# Patient Record
Sex: Female | Born: 1995 | Race: Black or African American | Hispanic: No | Marital: Single | Smoking: Current every day smoker
Health system: Southern US, Community
[De-identification: ages and names within clinical notes are randomized; demographics above are authoritative.]

## PROBLEM LIST (undated history)

## (undated) ENCOUNTER — Inpatient Hospital Stay (HOSPITAL_COMMUNITY): Payer: Self-pay

## (undated) DIAGNOSIS — R51 Headache: Secondary | ICD-10-CM

## (undated) HISTORY — PX: NO PAST SURGERIES: SHX2092

---
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
## 2013-01-17 ENCOUNTER — Encounter (HOSPITAL_COMMUNITY): Payer: Self-pay | Admitting: *Deleted

## 2013-01-17 ENCOUNTER — Emergency Department (HOSPITAL_COMMUNITY)
Admission: EM | Admit: 2013-01-17 | Discharge: 2013-01-17 | Discharge status: Against Medical Advice | Payer: Self-pay | Attending: Emergency Medicine | Admitting: Emergency Medicine

## 2013-01-17 DIAGNOSIS — W57XXXA Bitten or stung by nonvenomous insect and other nonvenomous arthropods, initial encounter: Secondary | ICD-10-CM | POA: Insufficient documentation

## 2013-01-17 DIAGNOSIS — F17200 Nicotine dependence, unspecified, uncomplicated: Secondary | ICD-10-CM | POA: Insufficient documentation

## 2013-01-17 DIAGNOSIS — L299 Pruritus, unspecified: Secondary | ICD-10-CM | POA: Insufficient documentation

## 2013-01-17 DIAGNOSIS — Y939 Activity, unspecified: Secondary | ICD-10-CM | POA: Insufficient documentation

## 2013-01-17 DIAGNOSIS — Y929 Unspecified place or not applicable: Secondary | ICD-10-CM | POA: Insufficient documentation

## 2013-01-17 DIAGNOSIS — T148 Other injury of unspecified body region: Secondary | ICD-10-CM | POA: Insufficient documentation

## 2013-01-17 NOTE — ED Notes (Signed)
Pt not in [facility] when called

## 2013-01-17 NOTE — ED Notes (Signed)
Nurse first stated that she saw pt and pt's family leave.

## 2013-01-17 NOTE — ED Notes (Signed)
Pt reports having mosquito bites that she is concerned she may be allergic to - pt states the area itches and becomes swollen.

## 2013-02-28 ENCOUNTER — Encounter (HOSPITAL_COMMUNITY): Payer: Self-pay | Admitting: *Deleted

## 2013-02-28 ENCOUNTER — Emergency Department (HOSPITAL_COMMUNITY)
Admission: EM | Admit: 2013-02-28 | Discharge: 2013-02-28 | Disposition: A | Discharge status: Home / Self Care | Payer: Self-pay | Attending: Emergency Medicine | Admitting: Emergency Medicine

## 2013-02-28 DIAGNOSIS — N949 Unspecified condition associated with female genital organs and menstrual cycle: Secondary | ICD-10-CM | POA: Insufficient documentation

## 2013-02-28 DIAGNOSIS — N9089 Other specified noninflammatory disorders of vulva and perineum: Secondary | ICD-10-CM

## 2013-02-28 DIAGNOSIS — Q519 Congenital malformation of uterus and cervix, unspecified: Secondary | ICD-10-CM | POA: Insufficient documentation

## 2013-02-28 DIAGNOSIS — Z3202 Encounter for pregnancy test, result negative: Secondary | ICD-10-CM | POA: Insufficient documentation

## 2013-02-28 DIAGNOSIS — Z789 Other specified health status: Secondary | ICD-10-CM | POA: Insufficient documentation

## 2013-02-28 DIAGNOSIS — F17200 Nicotine dependence, unspecified, uncomplicated: Secondary | ICD-10-CM | POA: Insufficient documentation

## 2013-02-28 DIAGNOSIS — Z79899 Other long term (current) drug therapy: Secondary | ICD-10-CM | POA: Insufficient documentation

## 2013-02-28 LAB — URINALYSIS, ROUTINE W REFLEX MICROSCOPIC
Bilirubin Urine: NEGATIVE
Ketones, ur: NEGATIVE mg/dL
Nitrite: NEGATIVE
Protein, ur: NEGATIVE mg/dL
Urobilinogen, UA: 1 mg/dL (ref 0.0–1.0)
pH: 5.5 (ref 5.0–8.0)

## 2013-02-28 LAB — WET PREP, GENITAL: Clue Cells Wet Prep HPF POC: NONE SEEN

## 2013-02-28 LAB — URINE MICROSCOPIC-ADD ON

## 2013-02-28 MED ORDER — LIDOCAINE 5 % EX OINT: TOPICAL_OINTMENT | CUTANEOUS | Status: DC | PRN

## 2013-02-28 NOTE — ED Notes (Signed)
Pt of discomfort to labia for several days.  Denies vaginal discharge.

## 2013-02-28 NOTE — ED Provider Notes (Signed)
CSN: [id number]     Arrival date & time 02/28/13  1237 History     First MD Initiated Contact with Patient 02/28/13 1311     Chief Complaint  Patient presents with  . Vaginal Itching   (Consider location/radiation/quality/duration/timing/severity/associated sxs/prior Treatment) HPI Patient is an 17 year old female who presented to the emergency department complaint cough back pain. Patient states pain began 3-4 days ago. He states pain is on the outside around the vaginal opening. Patient denies any vaginal discharge or vaginal bleeding. Patient is sexually active and admits to using new kind of condom. Patient denies any fever chills or malaise. Patient denies any history of similar symptoms. Patient denies being pregnant, as any urinary symptoms. Past Medical History  Diagnosis Date  . Medical history non-contributory   . Cervical incompetence with baby delivered in second trimester 12/04/2012    Needs cerclage and close follow up with subsequent pregnancies    Past Surgical History  Procedure Laterality Date  . No past surgeries    . Dilation and evacuation N/A 12/04/2012    Procedure: DILATATION AND EVACUATION;  Surgeon: Tereso Newcomer, MD;  Location: [facility] OR[facility];  Service: Gynecology;  Laterality: N/A;   No family history on file. History  Substance Use Topics  . Smoking status: Current Every Day Smoker -- 0.50 packs/day for 1 years    Types: Cigarettes  . Smokeless tobacco: Not on file  . Alcohol Use: No   OB History   Grav Para Term Preterm Abortions TAB SAB Ect Mult Living   2 1 1       1      Review of Systems  Constitutional: Negative for fever and chills.  Gastrointestinal: Negative for abdominal pain.  Genitourinary: Positive for vaginal pain. Negative for dysuria, urgency, vaginal bleeding, vaginal discharge and pelvic pain.  Skin: Positive for wound.  All other systems reviewed and are negative.    Allergies  Review of patient's allergies indicates no known  allergies.  Home Medications   Current Outpatient Rx  Name  Route  Sig  Dispense  Refill  . ferrous sulfate (FERROUSUL) 325 (65 FE) MG tablet   Oral   Take 1 tablet (325 mg total) by mouth 3 (three) times daily with meals.   90 tablet   2    BP 102/64  Pulse 106  Temp(Src) 98 F (36.7 C) (Oral)  Resp 16  SpO2 99%  LMP 07/30/2012  Breastfeeding? No Physical Exam  Nursing note and vitals reviewed. Constitutional: She appears well-developed and well-nourished. No distress.  HENT:  Head: Normocephalic.  Eyes: Conjunctivae are normal.  Neck: Neck supple.  Cardiovascular: Normal rate, regular rhythm and normal heart sounds.   Pulmonary/Chest: Effort normal and breath sounds normal. No respiratory distress. She has no wheezes. She has no rales.  Abdominal: Soft. Bowel sounds are normal. She exhibits no distension. There is no tenderness. There is no rebound.  Genitourinary:  Normal external genitalia with exception of several superficial skin tears in the posterior vaginal opening. No bleeding. Vaginal canal is normal with a thin white vaginal discharge. Cervix is normal. No cervical motion tenderness. No uterine or adnexal tenderness.  Musculoskeletal: She exhibits no edema.  Neurological: She is alert.  Skin: Skin is warm and dry.  Psychiatric: She has a normal mood and affect. Her behavior is normal.    ED Course   Procedures (including critical care time)  Labs Reviewed  WET PREP, GENITAL - Abnormal; Notable for the following:  WBC, Wet Prep HPF POC MODERATE (*)    All other components within normal limits  URINALYSIS, ROUTINE W REFLEX MICROSCOPIC - Abnormal; Notable for the following:    Leukocytes, UA SMALL (*)    All other components within normal limits  GC/CHLAMYDIA PROBE AMP  PREGNANCY, URINE  URINE MICROSCOPIC-ADD ON   1. Fissure of genital labia     MDM  Patient with several tear and medial fissures red around the posterior vaginal opening. No signs of  infection or an abscess. Pelvic exam unremarkable. Culture sent. Patient denies any vaginal or urinary symptoms. Will treat symptomatically with at home sitz baths, lidocaine cream, pelvic rest and followup as needed.  Filed Vitals:   02/28/13 1242  BP: 102/64  Pulse: 106  Temp: 98 F (36.7 C)  TempSrc: Oral  Resp: 16  SpO2: 99%     Shritha Bresee A Aodhan Scheidt, PA-C 02/28/13 1504

## 2013-03-01 NOTE — ED Provider Notes (Signed)
Medical screening examination/treatment/procedure(s) were performed by non-physician practitioner and as supervising physician I was immediately available for consultation/collaboration.   Loren Racer, MD 03/01/13 1215

## 2013-03-02 LAB — GC/CHLAMYDIA PROBE AMP: GC Probe RNA: NEGATIVE

## 2013-03-19 ENCOUNTER — Emergency Department (HOSPITAL_COMMUNITY)
Admission: EM | Admit: 2013-03-19 | Discharge: 2013-03-19 | Disposition: A | Discharge status: Home / Self Care | Payer: Medicaid Other | Attending: Emergency Medicine | Admitting: Emergency Medicine

## 2013-03-19 ENCOUNTER — Encounter (HOSPITAL_COMMUNITY): Payer: Self-pay | Admitting: *Deleted

## 2013-03-19 DIAGNOSIS — N949 Unspecified condition associated with female genital organs and menstrual cycle: Secondary | ICD-10-CM | POA: Insufficient documentation

## 2013-03-19 DIAGNOSIS — N760 Acute vaginitis: Secondary | ICD-10-CM | POA: Insufficient documentation

## 2013-03-19 DIAGNOSIS — Z3202 Encounter for pregnancy test, result negative: Secondary | ICD-10-CM | POA: Insufficient documentation

## 2013-03-19 DIAGNOSIS — J3489 Other specified disorders of nose and nasal sinuses: Secondary | ICD-10-CM | POA: Insufficient documentation

## 2013-03-19 DIAGNOSIS — R51 Headache: Secondary | ICD-10-CM | POA: Insufficient documentation

## 2013-03-19 DIAGNOSIS — A499 Bacterial infection, unspecified: Secondary | ICD-10-CM | POA: Insufficient documentation

## 2013-03-19 DIAGNOSIS — B9689 Other specified bacterial agents as the cause of diseases classified elsewhere: Secondary | ICD-10-CM | POA: Insufficient documentation

## 2013-03-19 DIAGNOSIS — N72 Inflammatory disease of cervix uteri: Secondary | ICD-10-CM | POA: Insufficient documentation

## 2013-03-19 DIAGNOSIS — R110 Nausea: Secondary | ICD-10-CM | POA: Insufficient documentation

## 2013-03-19 DIAGNOSIS — F17200 Nicotine dependence, unspecified, uncomplicated: Secondary | ICD-10-CM | POA: Insufficient documentation

## 2013-03-19 DIAGNOSIS — N898 Other specified noninflammatory disorders of vagina: Secondary | ICD-10-CM

## 2013-03-19 LAB — URINALYSIS, ROUTINE W REFLEX MICROSCOPIC
Bilirubin Urine: NEGATIVE
Ketones, ur: NEGATIVE mg/dL
Nitrite: NEGATIVE
Protein, ur: NEGATIVE mg/dL
pH: 7.5 (ref 5.0–8.0)

## 2013-03-19 LAB — WET PREP, GENITAL

## 2013-03-19 MED ORDER — CEFTRIAXONE SODIUM 250 MG IJ SOLR
250.0000 mg | Freq: Once | INTRAMUSCULAR | Status: AC
  Administered 2013-03-19: 250 mg via INTRAMUSCULAR
  Filled 2013-03-19: qty 250

## 2013-03-19 MED ORDER — AZITHROMYCIN 250 MG PO TABS
1000.0000 mg | ORAL_TABLET | Freq: Once | ORAL | Status: AC
  Administered 2013-03-19: 1000 mg via ORAL
  Filled 2013-03-19: qty 4

## 2013-03-19 MED ORDER — METRONIDAZOLE 500 MG PO TABS: 500.0000 mg | ORAL_TABLET | Freq: Two times a day (BID) | ORAL | Status: DC

## 2013-03-19 MED ORDER — LIDOCAINE HCL (PF) 1 % IJ SOLN
INTRAMUSCULAR | Status: AC
  Administered 2013-03-19: 2 mL
  Filled 2013-03-19: qty 10

## 2013-03-19 NOTE — ED Notes (Signed)
Reports having foul smelling vaginal discharge and having discharge from her breast. lmp two weeks ago.

## 2013-03-19 NOTE — ED Provider Notes (Signed)
CSN: [id number]     Arrival date & time 03/19/13  1454 History   None    Chief Complaint  Patient presents with  . Vaginal Discharge    HPI  Pamela Curtis is a 17 y.o. female with no PMH who presents to the ED for evaluation of vaginal discharge.  Patient states that she has had vaginal discharge for the past two days.  She reports foul smelling discharge, which is described as white and thick.  She also has mild lower middle pelvic pain described as a pressure sensation. She also has been nauseated with no emesis.  She denies any vaginal bleeding.  Her LNMP was two weeks ago.  She is sexually active with no means of contraception currently.  She has a history of STI which was tx in the past.  She denies any vaginal sores, itching or pain.  G2 P1. He does not have an OB/GYN currently. She also states that she had some white thick discharge from her nipples. She states that she had to milk out the discharge and denies any profuse breast leakage. She denies any breast tenderness, masses, or redness.  Patient denies any abdominal pain, diarrhea, constipation, dysuria or hematuria. She states that she has had some rhinorrhea and intermittent headaches with no headache currently. She states she otherwise has been well with no fever, chills, change in appetite, cough, chest pain, shortness of breath, weakness and the dizziness, lightheadedness or leg edema.   Past Medical History  Diagnosis Date  . Medical history non-contributory   . Cervical incompetence with baby delivered in second trimester 12/04/2012    Needs cerclage and close follow up with subsequent pregnancies    Past Surgical History  Procedure Laterality Date  . No past surgeries    . Dilation and evacuation N/A 12/04/2012    Procedure: DILATATION AND EVACUATION;  Surgeon: Tereso Newcomer, MD;  Location: [facility];  Service: Gynecology;  Laterality: N/A;   History reviewed. No pertinent family history. History  Substance Use Topics  .  Smoking status: Current Every Day Smoker -- 0.50 packs/day for 1 years    Types: Cigarettes  . Smokeless tobacco: Not on file  . Alcohol Use: No   OB History   Grav Para Term Preterm Abortions TAB SAB Ect Mult Living   2 1 1       1      Review of Systems  Constitutional: Negative for fever, chills, activity change, appetite change and fatigue.  HENT: Positive for congestion and rhinorrhea. Negative for nosebleeds, sore throat, neck pain and neck stiffness.   Respiratory: Negative for cough, shortness of breath and wheezing.   Cardiovascular: Negative for chest pain, palpitations and leg swelling.  Gastrointestinal: Positive for nausea. Negative for vomiting, abdominal pain, diarrhea, constipation, blood in stool and abdominal distention.  Genitourinary: Positive for vaginal discharge and pelvic pain. Negative for dysuria, hematuria, flank pain, decreased urine volume, vaginal bleeding, difficulty urinating, genital sores, vaginal pain and dyspareunia.  Musculoskeletal: Negative for back pain.  Skin: Negative for wound.  Neurological: Positive for headaches. Negative for dizziness, syncope, weakness and numbness.    Allergies  Review of patient's allergies indicates no known allergies.  Home Medications  No current outpatient prescriptions on file. BP 114/71  Pulse 97  Temp(Src) 97.9 F (36.6 C) (Oral)  Resp 18  SpO2 99%  LMP 03/05/2013  Filed Vitals:   03/19/13 1501 03/19/13 2038  BP: 114/71 104/84  Pulse: 97 84  Temp: 97.9 F (  36.6 C) 98.7 F (37.1 C)  TempSrc: Oral Oral  Resp: 18 18  SpO2: 99% 98%    Physical Exam  Nursing note and vitals reviewed. Constitutional: She is oriented to person, place, and time. She appears well-developed and well-nourished. No distress.  HENT:  Head: Normocephalic and atraumatic.  Right Ear: External ear normal.  Left Ear: External ear normal.  Nose: Nose normal.  Mouth/Throat: Oropharynx is clear and moist. No oropharyngeal  exudate.  Eyes: Conjunctivae are normal. Pupils are equal, round, and reactive to light. Right eye exhibits no discharge. Left eye exhibits no discharge.  Neck: Normal range of motion. Neck supple.  Cardiovascular: Normal rate, regular rhythm, normal heart sounds and intact distal pulses.  Exam reveals no gallop and no friction rub.   No murmur heard. Pulmonary/Chest: Effort normal and breath sounds normal. No respiratory distress. She has no wheezes. She has no rales. She exhibits no tenderness.  Abdominal: Soft. Bowel sounds are normal. She exhibits no distension and no mass. There is no tenderness. There is no rebound and no guarding.  Genitourinary: Vaginal discharge found.  Moderate amount of thin white discharge present in the vaginal vault.  Tissue surrounding the cervical os appears friable.  No CMT or adnexal tenderness bilaterally.  No nipple discharge, erythema, edema, lacerations bilaterally.    Musculoskeletal: Normal range of motion. She exhibits no edema and no tenderness.  Neurological: She is alert and oriented to person, place, and time.  Skin: Skin is warm and dry. She is not diaphoretic.    ED Course  Procedures (including critical care time) Labs Review Labs Reviewed - No data to display Imaging Review No results found.  Results for orders placed during the hospital encounter of 03/19/13  GC/CHLAMYDIA PROBE AMP      Result Value Range   CT Probe RNA NEGATIVE  NEGATIVE   GC Probe RNA NEGATIVE  NEGATIVE  WET PREP, GENITAL      Result Value Range   Yeast Wet Prep HPF POC NONE SEEN  NONE SEEN   Trich, Wet Prep NONE SEEN  NONE SEEN   Clue Cells Wet Prep HPF POC FEW (*) NONE SEEN   WBC, Wet Prep HPF POC MODERATE (*) NONE SEEN  URINALYSIS, ROUTINE W REFLEX MICROSCOPIC      Result Value Range   Color, Urine YELLOW  YELLOW   APPearance HAZY (*) CLEAR   Specific Gravity, Urine 1.026  1.005 - 1.030   pH 7.5  5.0 - 8.0   Glucose, UA NEGATIVE  NEGATIVE mg/dL   Hgb urine  dipstick NEGATIVE  NEGATIVE   Bilirubin Urine NEGATIVE  NEGATIVE   Ketones, ur NEGATIVE  NEGATIVE mg/dL   Protein, ur NEGATIVE  NEGATIVE mg/dL   Urobilinogen, UA 1.0  0.0 - 1.0 mg/dL   Nitrite NEGATIVE  NEGATIVE   Leukocytes, UA NEGATIVE  NEGATIVE  PREGNANCY, URINE      Result Value Range   Preg Test, Ur NEGATIVE  NEGATIVE  RPR      Result Value Range   RPR NON REACTIVE  NON REACTIVE  HIV ANTIBODY (ROUTINE TESTING)      Result Value Range   HIV NON REACTIVE  NON REACTIVE     MDM  No diagnosis found.  Pamela Curtis is a 17 y.o. female with no PMH who presents to the ED for evaluation of vaginal discharge.  UA and urine pregnancy ordered to further evaluate.  ED STD screen will be performed.  Rechecks  7:30 PM = Pelvic exam performed with ER tech staff present.     Etiology of vaginal discharge possibly due to BV.  GC swabs sent for analysis.  She will be treated with Rocephin and Azithromycin in the ED.  She will be given a prescription for flagyl.  She was instructed to return to the ED if she experiences any repeated emesis, severe worsening abdominal pain, fever, or other concerns.  She was instructed to follow-up with an OB/GYN next week for further evaluation and management including a breast exam and PAP smear/HPV testing.  She was in agreement with discharge and plan.    Final impressions: 1. Bacterial vaginosis  2. Cervicitis     Luiz Iron PA-C   This patient was discussed with Dr. Arloa Koh, PA-C 03/20/13 [phone]

## 2013-03-19 NOTE — ED Notes (Signed)
Pt discharged.Vital signs stable and GCS 15 

## 2013-03-20 LAB — GC/CHLAMYDIA PROBE AMP
CT Probe RNA: NEGATIVE
GC Probe RNA: NEGATIVE

## 2013-03-20 LAB — HIV ANTIBODY (ROUTINE TESTING W REFLEX): HIV: NONREACTIVE

## 2013-03-21 NOTE — ED Provider Notes (Signed)
Medical screening examination/treatment/procedure(s) were performed by non-physician practitioner and as supervising physician I was immediately available for consultation/collaboration.  Flint Melter, MD 03/21/13 1350

## 2013-04-27 ENCOUNTER — Encounter (HOSPITAL_COMMUNITY): Payer: Self-pay | Admitting: Emergency Medicine

## 2013-04-27 ENCOUNTER — Emergency Department (HOSPITAL_COMMUNITY)
Admission: EM | Admit: 2013-04-27 | Discharge: 2013-04-27 | Discharge status: Against Medical Advice | Payer: Medicaid Other | Attending: Emergency Medicine | Admitting: Emergency Medicine

## 2013-04-27 DIAGNOSIS — F17200 Nicotine dependence, unspecified, uncomplicated: Secondary | ICD-10-CM | POA: Insufficient documentation

## 2013-04-27 DIAGNOSIS — Z3202 Encounter for pregnancy test, result negative: Secondary | ICD-10-CM | POA: Insufficient documentation

## 2013-04-27 DIAGNOSIS — O926 Galactorrhea: Secondary | ICD-10-CM | POA: Insufficient documentation

## 2013-04-27 DIAGNOSIS — R109 Unspecified abdominal pain: Secondary | ICD-10-CM | POA: Insufficient documentation

## 2013-04-27 LAB — URINALYSIS, ROUTINE W REFLEX MICROSCOPIC
Bilirubin Urine: NEGATIVE
Glucose, UA: NEGATIVE mg/dL
Hgb urine dipstick: NEGATIVE
Specific Gravity, Urine: 1.031 — ABNORMAL HIGH (ref 1.005–1.030)

## 2013-04-27 LAB — URINE MICROSCOPIC-ADD ON

## 2013-04-27 NOTE — ED Notes (Signed)
Urine sent

## 2013-04-27 NOTE — ED Notes (Signed)
Patient presents with c/o abd cramping, as if her period is about to start and breast leaking (right more than left)

## 2013-04-27 NOTE — ED Notes (Signed)
Called patient multiple times and went outside to find pt, but to no avail.

## 2013-04-28 LAB — URINE CULTURE: Colony Count: 30000

## 2013-04-29 NOTE — ED Notes (Signed)
Post ED Visit - Positive Culture Follow-up: Successful Patient Follow-Up  Culture assessed and recommendations reviewed by: [x]  Wes Dulaney, Pharm.D., BCPS []  Celedonio Miyamoto, Pharm.D., BCPS []  Georgina Pillion, Pharm.D., BCPS []  [place], [address].D., BCPS, AAHIVP []  Estella Husk, Pharm.D., BCPS, AAHIVP  Positive Group B strep culture  []  Patient discharged without antimicrobial prescription and treatment is now indicated [x]  Organism is resistant to prescribed ED discharge antimicrobial []  Patient with positive blood cultures  Changes discussed with ED provider: Johnnette Gourd No treatment needed. Contacted patient :Patient informed of results.  Larena Sox 04/29/2013, 4:58 PM

## 2013-04-29 NOTE — Progress Notes (Signed)
ED Antimicrobial Stewardship Positive Culture Follow Up   Pamela Curtis is an 17 y.o. female who presented to [facility] on 04/27/2013 with a chief complaint of  Chief Complaint  Patient presents with  . Abdominal Pain  . Galactorrhea    Recent Results (from the past 720 hour(s))  URINE CULTURE     Status: None   Collection Time    04/27/13  5:28 PM      Result Value Range Status   Specimen Description URINE, CLEAN CATCH   Final   Special Requests NONE   Final   Culture  Setup Time     Final   Value: 04/27/2013 18:52     Performed at [facility] Count     Final   Value: 30,000 COLONIES/ML     Performed at [facility]   Culture     Final   Value: GROUP B STREP(S.AGALACTIAE)ISOLATED     Note: TESTING AGAINST S. AGALACTIAE NOT ROUTINELY PERFORMED DUE TO PREDICTABILITY OF AMP/PEN/VAN SUSCEPTIBILITY.     Performed at [facility]   Report Status 04/28/2013 FINAL   Final   16yof presented with abdominal cramps. No urinary symptoms reported and UA was inconclusive. Asymptomatic bacteriuria - no treatment indicated at this time.    ED Provider: Johnnette Gourd, PA-C   [name] 04/29/2013, 4:57 PM Infectious Diseases Pharmacist Phone# [phone]

## 2013-05-02 ENCOUNTER — Encounter (HOSPITAL_COMMUNITY): Payer: Self-pay | Admitting: Emergency Medicine

## 2013-05-02 ENCOUNTER — Emergency Department (INDEPENDENT_AMBULATORY_CARE_PROVIDER_SITE_OTHER)
Admission: EM | Admit: 2013-05-02 | Discharge: 2013-05-02 | Disposition: A | Discharge status: Home / Self Care | Payer: Self-pay | Source: Home / Self Care | Attending: Family Medicine | Admitting: Family Medicine

## 2013-05-02 DIAGNOSIS — O99891 Other specified diseases and conditions complicating pregnancy: Secondary | ICD-10-CM

## 2013-05-02 DIAGNOSIS — Z3201 Encounter for pregnancy test, result positive: Secondary | ICD-10-CM

## 2013-05-02 DIAGNOSIS — N390 Urinary tract infection, site not specified: Secondary | ICD-10-CM

## 2013-05-02 LAB — POCT URINALYSIS DIP (DEVICE)
Bilirubin Urine: NEGATIVE
Glucose, UA: NEGATIVE mg/dL
Hgb urine dipstick: NEGATIVE
Ketones, ur: NEGATIVE mg/dL
Nitrite: NEGATIVE
Specific Gravity, Urine: 1.025 (ref 1.005–1.030)
pH: 7 (ref 5.0–8.0)

## 2013-05-02 MED ORDER — AMOXICILLIN 875 MG PO TABS: 875.0000 mg | ORAL_TABLET | Freq: Two times a day (BID) | ORAL | Status: DC

## 2013-05-02 NOTE — ED Notes (Signed)
Pt is here for poss preg  Sxs also include: intermittent abd pain around naval area Denies: f/v/n/d, vag d/c, urinary sxs Reports she went to [facility] ED on 10/13 but they took too long and she left Urine Preg done at ED and it was positive She is alert w/no signs of acute distress.

## 2013-05-02 NOTE — ED Provider Notes (Signed)
CSN: [id number]     Arrival date & time 05/02/13  1155 History   First MD Initiated Contact with Patient 05/02/13 1255     Chief Complaint  Patient presents with  . Possible Pregnancy   (Consider location/radiation/quality/duration/timing/severity/associated sxs/prior Treatment) HPI Comments: 17 year old female presents complaining of possible pregnancy. She has intermittent abdominal pain around her right lower quadrant and periumbilical that she last fell 2 days ago. She has also had some discharge from her nipples. She is currently in no pain whatsoever. She did a urine pregnancy test at home that was positive. She [facility] to [facility] an appointment but they said she had to be seen somewhere to have proof of pregnancy. She denies fever, chills, vaginal bleeding, vaginal discharge, NVD, abdominal pain. She notes that on her last pregnancy she had a spontaneous abortion at 3 months due to cervical incompetence.  Patient is a 17 y.o. female presenting with pregnancy problem.  Possible Pregnancy  Primary symptoms include abdominal pain (resolved).  Associated symptoms include no dysuria, no fever, no light-headedness, no nausea, no shortness of breath, no vomiting and no weakness.    Past Medical History  Diagnosis Date  . Medical history non-contributory   . Cervical incompetence with baby delivered in second trimester 12/04/2012    Needs cerclage and close follow up with subsequent pregnancies    Past Surgical History  Procedure Laterality Date  . No past surgeries    . Dilation and evacuation N/A 12/04/2012    Procedure: DILATATION AND EVACUATION;  Surgeon: Tereso Newcomer, MD;  Location: [facility];  Service: Gynecology;  Laterality: N/A;   No family history on file. History  Substance Use Topics  . Smoking status: Current Every Day Smoker -- 0.50 packs/day for 1 years    Types: Cigarettes  . Smokeless tobacco: Not on file  . Alcohol Use: Yes   Comment: ocassionally   OB History   Grav Para Term Preterm Abortions TAB SAB Ect Mult Living   2 1 1       1      Review of Systems  Constitutional: Negative for fever and chills.  Eyes: Negative for visual disturbance.  Respiratory: Negative for cough and shortness of breath.   Cardiovascular: Negative for chest pain, palpitations and leg swelling.  Gastrointestinal: Positive for abdominal pain (resolved). Negative for nausea and vomiting.  Endocrine: Negative for polydipsia and polyuria.  Genitourinary: Negative for dysuria, urgency and frequency.  Musculoskeletal: Negative for arthralgias and myalgias.  Skin: Negative for rash.  Neurological: Negative for dizziness, weakness and light-headedness.    Allergies  Review of patient's allergies indicates no known allergies.  Home Medications   Current Outpatient Rx  Name  Route  Sig  Dispense  Refill  . amoxicillin (AMOXIL) 875 MG tablet   Oral   Take 1 tablet (875 mg total) by mouth 2 (two) times daily.   14 tablet   0   . metroNIDAZOLE (FLAGYL) 500 MG tablet   Oral   Take 1 tablet (500 mg total) by mouth 2 (two) times daily.   14 tablet   0    BP 111/63  Pulse 82  Temp(Src) 98.4 F (36.9 C) (Oral)  Resp 16  SpO2 99%  LMP 03/24/2013 Physical Exam  Nursing note and vitals reviewed. Constitutional: She is oriented to person, place, and time. Vital signs are normal. She appears well-developed and well-nourished. No distress.  HENT:  Head: Normocephalic and atraumatic.  Pulmonary/Chest: Effort  normal. No respiratory distress.  Abdominal: There is no tenderness.  Neurological: She is alert and oriented to person, place, and time. She has normal strength. Coordination normal.  Skin: Skin is warm and dry. No rash noted. She is not diaphoretic.  Psychiatric: She has a normal mood and affect. Judgment normal.    ED Course  Procedures (including critical care time) Labs Review Labs Reviewed  POCT URINALYSIS DIP  (DEVICE)  POCT PREGNANCY, URINE   Imaging Review No results found.    MDM   1. Positive urine pregnancy test   2. Bacteriuria, asymptomatic in pregnancy, first trimester    Spoke to the provider at the [facility] hospital on the phone, they would not place a cerclage until 14-16 weeks. She is recommended routine outpatient followup for this patient. At her last your visit she had a urine culture that was positive for group B strep. Asymptomatic bacteriuria should be treated during pregnancy, prescribing amoxicillin. Followup with [facility] outpatient clinic.   Meds ordered this encounter  Medications  . amoxicillin (AMOXIL) 875 MG tablet    Sig: Take 1 tablet (875 mg total) by mouth 2 (two) times daily.    Dispense:  14 tablet    Refill:  0    Order Specific Question:  Supervising Provider    Answer:  Clementeen Graham, Kathie Rhodes [[id number]]       Graylon Good, PA-C 05/02/13 [phone]

## 2013-05-04 NOTE — ED Provider Notes (Signed)
Medical screening examination/treatment/procedure(s) were performed by a resident physician or non-physician practitioner and as the supervising physician I was immediately available for consultation/collaboration.  Stacyann Mcconaughy, MD    Anjana Cheek S Makenzye Troutman, MD 05/04/13 0947 

## 2013-05-08 ENCOUNTER — Emergency Department (HOSPITAL_COMMUNITY)
Admission: EM | Admit: 2013-05-08 | Discharge: 2013-05-08 | Disposition: A | Discharge status: Home / Self Care | Payer: Self-pay | Attending: Emergency Medicine | Admitting: Emergency Medicine

## 2013-05-08 ENCOUNTER — Encounter (HOSPITAL_COMMUNITY): Payer: Self-pay | Admitting: Emergency Medicine

## 2013-05-08 ENCOUNTER — Emergency Department (HOSPITAL_COMMUNITY): Payer: Self-pay

## 2013-05-08 DIAGNOSIS — N898 Other specified noninflammatory disorders of vagina: Secondary | ICD-10-CM | POA: Insufficient documentation

## 2013-05-08 DIAGNOSIS — N949 Unspecified condition associated with female genital organs and menstrual cycle: Secondary | ICD-10-CM | POA: Insufficient documentation

## 2013-05-08 DIAGNOSIS — O99330 Smoking (tobacco) complicating pregnancy, unspecified trimester: Secondary | ICD-10-CM | POA: Insufficient documentation

## 2013-05-08 DIAGNOSIS — R102 Pelvic and perineal pain: Secondary | ICD-10-CM

## 2013-05-08 DIAGNOSIS — R21 Rash and other nonspecific skin eruption: Secondary | ICD-10-CM | POA: Insufficient documentation

## 2013-05-08 DIAGNOSIS — Z792 Long term (current) use of antibiotics: Secondary | ICD-10-CM | POA: Insufficient documentation

## 2013-05-08 DIAGNOSIS — R109 Unspecified abdominal pain: Secondary | ICD-10-CM | POA: Insufficient documentation

## 2013-05-08 DIAGNOSIS — Z3490 Encounter for supervision of normal pregnancy, unspecified, unspecified trimester: Secondary | ICD-10-CM

## 2013-05-08 DIAGNOSIS — O9989 Other specified diseases and conditions complicating pregnancy, childbirth and the puerperium: Secondary | ICD-10-CM | POA: Insufficient documentation

## 2013-05-08 LAB — URINALYSIS, ROUTINE W REFLEX MICROSCOPIC
Glucose, UA: NEGATIVE mg/dL
Leukocytes, UA: NEGATIVE
Protein, ur: NEGATIVE mg/dL
Specific Gravity, Urine: 1.025 (ref 1.005–1.030)
pH: 6 (ref 5.0–8.0)

## 2013-05-08 LAB — WET PREP, GENITAL
Trich, Wet Prep: NONE SEEN
Yeast Wet Prep HPF POC: NONE SEEN

## 2013-05-08 LAB — ABO/RH: ABO/RH(D): O POS

## 2013-05-08 MED ORDER — LIDOCAINE HCL 1 % IJ SOLN
INTRAMUSCULAR | Status: AC
  Administered 2013-05-08: 1 mL
  Filled 2013-05-08: qty 20

## 2013-05-08 MED ORDER — AZITHROMYCIN 250 MG PO TABS
1000.0000 mg | ORAL_TABLET | Freq: Once | ORAL | Status: AC
  Administered 2013-05-08: 1000 mg via ORAL
  Filled 2013-05-08: qty 4

## 2013-05-08 MED ORDER — CEFTRIAXONE SODIUM 250 MG IJ SOLR
250.0000 mg | Freq: Once | INTRAMUSCULAR | Status: AC
  Administered 2013-05-08: 250 mg via INTRAMUSCULAR
  Filled 2013-05-08: qty 250

## 2013-05-08 NOTE — Progress Notes (Signed)
[facility] provided pt with a list of primary care resources, highlighting [facility].  °

## 2013-05-08 NOTE — ED Notes (Signed)
Pt reports a rash on upper thigh near the groin region.  Pt reports it itches.

## 2013-05-08 NOTE — ED Notes (Signed)
Patient transported to Ultrasound 

## 2013-05-08 NOTE — ED Provider Notes (Signed)
Medical screening examination/treatment/procedure(s) were performed by non-physician practitioner and as supervising physician I was immediately available for consultation/collaboration.  EKG Interpretation   None        Derwood Kaplan, MD 05/08/13 2325

## 2013-05-08 NOTE — ED Notes (Signed)
Pt escorted to discharge window. Verbalized understanding discharge instructions. In no acute distress. Vitals reviewed and WDL.  

## 2013-05-08 NOTE — ED Notes (Signed)
Pt aware of need for urine sample.  

## 2013-05-08 NOTE — ED Provider Notes (Signed)
CSN: [id number]     Arrival date & time 05/08/13  0351 History   First MD Initiated Contact with Patient 05/08/13 [phone]     Chief Complaint  Patient presents with  . Vaginal Pain  . Vaginal Discharge   (Consider location/radiation/quality/duration/timing/severity/associated sxs/prior Treatment) HPI  17 year old female, G3 P1 who is currently pregnant presents complaining of vaginal discomfort and low abdominal pain.  Patient reports for the past week each time she urinates she noticed some burning sensation when she wipes. Denies dysuria, only discomfort while wiping. Also report intermittent left lower abdomen tenderness. Described as a sharp sensation lasting only for a few seconds and resolved without specific treatment. This has been ongoing for the past week as well. She did a home pregnancy test and was positive which also was confirmed when she went to urgent care this week. She was found to have bacteremia in her urine and was prescribed amoxicillin which she is currently taken. For the past several days she noticed yellow vaginal discharge without odor. She did have prior history of Chlamydia. Denies fever, chills, headache, chest pain, shortness of breath, back pain, vaginal bleeding, or rash. She has not had a formal ultrasound for her pregnancy. She cannot recall her last menstrual period.  Denies pain with sexual activity.  Past Medical History  Diagnosis Date  . Medical history non-contributory   . Cervical incompetence with baby delivered in second trimester 12/04/2012    Needs cerclage and close follow up with subsequent pregnancies    Past Surgical History  Procedure Laterality Date  . No past surgeries    . Dilation and evacuation N/A 12/04/2012    Procedure: DILATATION AND EVACUATION;  Surgeon: Tereso Newcomer, MD;  Location: [facility];  Service: Gynecology;  Laterality: N/A;   History reviewed. No pertinent family history. History  Substance Use Topics  . Smoking status:  Current Every Day Smoker -- 0.50 packs/day for 1 years    Types: Cigarettes  . Smokeless tobacco: Not on file  . Alcohol Use: Yes     Comment: ocassionally   OB History   Grav Para Term Preterm Abortions TAB SAB Ect Mult Living   3 1 1       1      Review of Systems  All other systems reviewed and are negative.    Allergies  Review of patient's allergies indicates no known allergies.  Home Medications   Current Outpatient Rx  Name  Route  Sig  Dispense  Refill  . amoxicillin (AMOXIL) 875 MG tablet   Oral   Take 1 tablet (875 mg total) by mouth 2 (two) times daily.   14 tablet   0    BP 110/52  Pulse 74  Temp(Src) 99 F (37.2 C) (Oral)  Resp 18  SpO2 100%  LMP 03/24/2013 Physical Exam  Nursing note and vitals reviewed. Constitutional: She appears well-developed and well-nourished. No distress.  HENT:  Head: Normocephalic and atraumatic.  Eyes: Conjunctivae are normal.  Neck: Normal range of motion. Neck supple.  Cardiovascular: Normal rate and regular rhythm.   Pulmonary/Chest: Effort normal and breath sounds normal. She exhibits no tenderness.  Abdominal: Soft. There is no tenderness.  Nongravid abdomen.  Mild tenderness to left lower quadrant on deep palpation without guarding or rebound tenderness. No hernia noted. No rash.  Genitourinary: Vagina normal and uterus normal. There is no rash or lesion on the right labia. There is no rash or lesion on the left labia. Cervix exhibits no  motion tenderness and no discharge. Right adnexum displays no mass and no tenderness. Left adnexum displays tenderness. Left adnexum displays no mass. No erythema, tenderness or bleeding around the vagina. No vaginal discharge found.  Chaperone present  Lymphadenopathy:       Right: No inguinal adenopathy present.       Left: No inguinal adenopathy present.  Skin: Rash (pt has several localized skin irritation noted to bilateral forearms, left lower abdomen, left inner thigh without  petechia/vesicle/pustular lesions.  no rash to webspace of fingers) noted.    ED Course  Procedures (including critical care time)  6:29 AM Patient who is currently pregnant without formal ultrasound presents with vaginal discharge and left adnexal tenderness on pelvic exam. Cervical os is closed. Patient is afebrile and in no acute distress, nontoxic. Plan to obtain UA, pregnancy test, Quant HCG, Rh ABO, and a formal ultrasound. Care discussed with attending.  7:20 AM Pt has several localized skin irritation likely insect bite.  Does not appears infected.    9:22 AM US shows a 5 weeks 6 days intrauterine pregnancy.  Pt is recommended to have a repeat US in 7-10 days.  Will give referral to [facility]  Otherwise, pt stable for discharge.  Pt was given rocephin/zithromax combo due to having pelvid discomfort on pelvic exam and many WBC on wet prep.  Cultures sent  Labs Review Labs Reviewed  WET PREP, GENITAL - Abnormal; Notable for the following:    WBC, Wet Prep HPF POC MANY (*)    All other components within normal limits  HCG, QUANTITATIVE, PREGNANCY - Abnormal; Notable for the following:    hCG, Beta Chain, Quant, S 14185 (*)    All other components within normal limits  GC/CHLAMYDIA PROBE AMP  URINALYSIS, ROUTINE W REFLEX MICROSCOPIC  ABO/RH   Imaging Review US Ob Comp Less 14 Wks  05/08/2013   CLINICAL DATA:  Vaginal pain, discharge  EXAM: OBSTETRIC <14 WK ULTRASOUND  TECHNIQUE: Transabdominal ultrasound was performed for evaluation of the gestation as well as the maternal uterus and adnexal regions.  COMPARISON:  None.  FINDINGS: Intrauterine gestational sac: Visualized/normal in shape.  Yolk sac:  Visualized  Embryo:  Not visualized  MSD: 11  mm   5 w   6  d  CRL:     mm    w  d                  [place] EDC: 01/02/2014  Maternal uterus/adnexae: No subchorionic hemorrhage. Ovaries are symmetric in size and echotexture. Probable left corpus luteal cyst. No adnexal masses. Small  amount of free fluid in the cul-de-sac.  IMPRESSION: Five week 6 day intrauterine pregnancy. No fetal pole visualized currently. Recommend followup with repeat ultrasound in 7-10 days. No acute maternal findings.   Electronically Signed   By: Charlett Nose M.D.   On: 05/08/2013 08:45   US Ob Transvaginal  05/08/2013   CLINICAL DATA:  Vaginal pain, discharge  EXAM: OBSTETRIC <14 WK ULTRASOUND  TECHNIQUE: Transabdominal ultrasound was performed for evaluation of the gestation as well as the maternal uterus and adnexal regions.  COMPARISON:  None.  FINDINGS: Intrauterine gestational sac: Visualized/normal in shape.  Yolk sac:  Visualized  Embryo:  Not visualized  MSD: 11  mm   5 w   6  d  CRL:     mm    w  d  [place] EDC: 01/02/2014  Maternal uterus/adnexae: No subchorionic hemorrhage. Ovaries are symmetric in size and echotexture. Probable left corpus luteal cyst. No adnexal masses. Small amount of free fluid in the cul-de-sac.  IMPRESSION: Five week 6 day intrauterine pregnancy. No fetal pole visualized currently. Recommend followup with repeat ultrasound in 7-10 days. No acute maternal findings.   Electronically Signed   By: Charlett Nose M.D.   On: 05/08/2013 08:45    EKG Interpretation   None       MDM   1. Pelvic pain   2. Pregnancy    BP 110/52  Pulse 74  Temp(Src) 99 F (37.2 C) (Oral)  Resp 18  SpO2 100%  LMP 03/24/2013  I have reviewed nursing notes and vital signs. I personally reviewed the imaging tests through PACS system  I reviewed available ER/hospitalization records thought the EMR     Fayrene Helper, [place] 05/08/13 9604

## 2013-05-08 NOTE — ED Notes (Signed)
Pt reports that she is pregnant and has been having vaginal pain with yellow/green discharge. Pt also wants to be evaluated for a skin rash

## 2013-05-11 ENCOUNTER — Telehealth: Payer: Self-pay

## 2013-05-11 DIAGNOSIS — Z3200 Encounter for pregnancy test, result unknown: Secondary | ICD-10-CM

## 2013-05-11 NOTE — Telephone Encounter (Signed)
Pt. Called stating she needed to have an appointment for an ultrasound changed; she was told she would need to be seen for an ultrasound within the next 7-10 days to rule out ectopic pregnancy per Wynelle Bourgeois. CNM Wynelle Bourgeois agrees and transvaginal ultrasound ordered via verbal order. Appointment made and patient called with appointment and verbalized understanding.

## 2013-05-15 ENCOUNTER — Ambulatory Visit (HOSPITAL_COMMUNITY): Payer: Self-pay

## 2013-05-18 ENCOUNTER — Ambulatory Visit (HOSPITAL_COMMUNITY)
Admission: RE | Admit: 2013-05-18 | Discharge: 2013-05-18 | Disposition: A | Discharge status: Home / Self Care | Payer: Medicaid Other | Source: Ambulatory Visit | Attending: Advanced Practice Midwife | Admitting: Advanced Practice Midwife

## 2013-05-18 DIAGNOSIS — Z3200 Encounter for pregnancy test, result unknown: Secondary | ICD-10-CM

## 2013-05-18 DIAGNOSIS — O99891 Other specified diseases and conditions complicating pregnancy: Secondary | ICD-10-CM | POA: Insufficient documentation

## 2013-05-18 DIAGNOSIS — Z3689 Encounter for other specified antenatal screening: Secondary | ICD-10-CM | POA: Insufficient documentation

## 2013-05-21 ENCOUNTER — Other Ambulatory Visit: Payer: Self-pay | Admitting: Obstetrics & Gynecology

## 2013-05-21 ENCOUNTER — Encounter: Payer: Self-pay | Admitting: Obstetrics & Gynecology

## 2013-05-21 ENCOUNTER — Ambulatory Visit (INDEPENDENT_AMBULATORY_CARE_PROVIDER_SITE_OTHER): Payer: Self-pay | Admitting: Obstetrics & Gynecology

## 2013-05-21 DIAGNOSIS — O0991 Supervision of high risk pregnancy, unspecified, first trimester: Secondary | ICD-10-CM

## 2013-05-21 DIAGNOSIS — O0993 Supervision of high risk pregnancy, unspecified, third trimester: Secondary | ICD-10-CM | POA: Insufficient documentation

## 2013-05-21 DIAGNOSIS — Z3682 Encounter for antenatal screening for nuchal translucency: Secondary | ICD-10-CM

## 2013-05-21 DIAGNOSIS — O3430 Maternal care for cervical incompetence, unspecified trimester: Secondary | ICD-10-CM

## 2013-05-21 LAB — POCT URINALYSIS DIP (DEVICE)
Hgb urine dipstick: NEGATIVE
Ketones, ur: NEGATIVE mg/dL
Protein, ur: 100 mg/dL — AB
Specific Gravity, Urine: >=1.03 (ref 1.005–1.030)
Urobilinogen, UA: 1 mg/dL (ref 0.0–1.0)
pH: 6 (ref 5.0–8.0)

## 2013-05-21 MED ORDER — RANITIDINE HCL 150 MG PO TABS: 150.0000 mg | ORAL_TABLET | Freq: Two times a day (BID) | ORAL | Status: DC

## 2013-05-21 MED ORDER — ONDANSETRON 4 MG PO TBDP: 4.0000 mg | ORAL_TABLET | Freq: Four times a day (QID) | ORAL | Status: DC | PRN

## 2013-05-21 NOTE — Progress Notes (Signed)
First Screen scheduled 06/24/13 at 1 pm.

## 2013-05-21 NOTE — Patient Instructions (Signed)
Pregnancy - First Trimester  During sexual intercourse, millions of sperm go into the vagina. Only 1 sperm will penetrate and fertilize the female egg while it is in the Fallopian tube. One week later, the fertilized egg implants into the wall of the uterus. An embryo begins to develop into a baby. At 6 to 8 weeks, the eyes and face are formed and the heartbeat can be seen on ultrasound. At the end of 12 weeks (first trimester), all the baby's organs are formed. Now that you are pregnant, you will want to do everything you can to have a healthy baby. Two of the most important things are to get good prenatal care and follow your caregiver's instructions. Prenatal care is all the medical care you receive before the baby's birth. It is given to prevent, find, and treat problems during the pregnancy and childbirth.  PRENATAL EXAMS  · During prenatal visits, your weight, blood pressure, and urine are checked. This is done to make sure you are healthy and progressing normally during the pregnancy.  · A pregnant woman should gain 25 to 35 pounds during the pregnancy. However, if you are overweight or underweight, your caregiver will advise you regarding your weight.  · Your caregiver will ask and answer questions for you.  · Blood work, cervical cultures, other necessary tests, and a Pap test are done during your prenatal exams. These tests are done to check on your health and the probable health of your baby. Tests are strongly recommended and done for HIV with your permission. This is the virus that causes AIDS. These tests are done because medicines can be given to help prevent your baby from being born with this infection should you have been infected without knowing it. Blood work is also used to find out your blood type, previous infections, and follow your blood levels (hemoglobin).  · Low hemoglobin (anemia) is common during pregnancy. Iron and vitamins are given to help prevent this. Later in the pregnancy, blood  tests for diabetes will be done along with any other tests if any problems develop.  · You may need other tests to make sure you and the baby are doing well.  CHANGES DURING THE FIRST TRIMESTER   Your body goes through many changes during pregnancy. They vary from person to person. Talk to your caregiver about changes you notice and are concerned about. Changes can include:  · Your menstrual period stops.  · The egg and sperm carry the genes that determine what you look like. Genes from you and your partner are forming a baby. The female genes determine whether the baby is a boy or a girl.  · Your body increases in girth and you may feel bloated.  · Feeling sick to your stomach (nauseous) and throwing up (vomiting). If the vomiting is uncontrollable, call your caregiver.  · Your breasts will begin to enlarge and become tender.  · Your nipples may stick out more and become darker.  · The need to urinate more. Painful urination may mean you have a bladder infection.  · Tiring easily.  · Loss of appetite.  · Cravings for certain kinds of food.  · At first, you may gain or lose a couple of pounds.  · You may have changes in your emotions from day to day (excited to be pregnant or concerned something may go wrong with the pregnancy and baby).  · You may have more vivid and strange dreams.  HOME CARE INSTRUCTIONS   ·   It is very important to avoid all smoking, alcohol and non-prescribed drugs during your pregnancy. These affect the formation and growth of the baby. Avoid chemicals while pregnant to ensure the delivery of a healthy infant.  · Start your prenatal visits by the 12th week of pregnancy. They are usually scheduled monthly at first, then more often in the last 2 months before delivery. Keep your caregiver's appointments. Follow your caregiver's instructions regarding medicine use, blood and lab tests, exercise, and diet.  · During pregnancy, you are providing food for you and your baby. Eat regular, well-balanced  meals. Choose foods such as meat, fish, milk and other low fat dairy products, vegetables, fruits, and whole-grain breads and cereals. Your caregiver will tell you of the ideal weight gain.  · You can help morning sickness by keeping soda crackers at the bedside. Eat a couple before arising in the morning. You may want to use the crackers without salt on them.  · Eating 4 to 5 small meals rather than 3 large meals a day also may help the nausea and vomiting.  · Drinking liquids between meals instead of during meals also seems to help nausea and vomiting.  · A physical sexual relationship may be continued throughout pregnancy if there are no other problems. Problems may be early (premature) leaking of amniotic fluid from the membranes, vaginal bleeding, or belly (abdominal) pain.  · Exercise regularly if there are no restrictions. Check with your caregiver or physical therapist if you are unsure of the safety of some of your exercises. Greater weight gain will occur in the last 2 trimesters of pregnancy. Exercising will help:  · Control your weight.  · Keep you in shape.  · Prepare you for labor and delivery.  · Help you lose your pregnancy weight after you deliver your baby.  · Wear a good support or jogging bra for breast tenderness during pregnancy. This may help if worn during sleep too.  · Ask when prenatal classes are available. Begin classes when they are offered.  · Do not use hot tubs, steam rooms, or saunas.  · Wear your seat belt when driving. This protects you and your baby if you are in an accident.  · Avoid raw meat, uncooked cheese, cat litter boxes, and soil used by cats throughout the pregnancy. These carry germs that can cause birth defects in the baby.  · The first trimester is a good time to visit your dentist for your dental health. Getting your teeth cleaned is okay. Use a softer toothbrush and brush gently during pregnancy.  · Ask for help if you have financial, counseling, or nutritional needs  during pregnancy. Your caregiver will be able to offer counseling for these needs as well as refer you for other special needs.  · Do not take any medicines or herbs unless told by your caregiver.  · Inform your caregiver if there is any mental or physical domestic violence.  · Make a list of emergency phone numbers of family, friends, hospital, and police and fire departments.  · Write down your questions. Take them to your prenatal visit.  · Do not douche.  · Do not cross your legs.  · If you have to stand for long periods of time, rotate you feet or take small steps in a circle.  · You may have more vaginal secretions that may require a sanitary pad. Do not use tampons or scented sanitary pads.  MEDICINES AND DRUG USE IN PREGNANCY  ·   Take prenatal vitamins as directed. The vitamin should contain 1 milligram of folic acid. Keep all vitamins out of reach of children. Only a couple vitamins or tablets containing iron may be fatal to a baby or young child when ingested.  · Avoid use of all medicines, including herbs, over-the-counter medicines, not prescribed or suggested by your caregiver. Only take over-the-counter or prescription medicines for pain, discomfort, or fever as directed by your caregiver. Do not use aspirin, ibuprofen, or naproxen unless directed by your caregiver.  · Let your caregiver also know about herbs you may be using.  · Alcohol is related to a number of birth defects. This includes fetal alcohol syndrome. All alcohol, in any form, should be avoided completely. Smoking will cause low birth rate and premature babies.  · Street or illegal drugs are very harmful to the baby. They are absolutely forbidden. A baby born to an addicted mother will be addicted at birth. The baby will go through the same withdrawal an adult does.  · Let your caregiver know about any medicines that you have to take and for what reason you take them.  SEEK MEDICAL CARE IF:   You have any concerns or worries during your  pregnancy. It is better to call with your questions if you feel they cannot wait, rather than worry about them.  SEEK IMMEDIATE MEDICAL CARE IF:   · An unexplained oral temperature above 102° F (38.9° C) develops, or as your caregiver suggests.  · You have leaking of fluid from the vagina (birth canal). If leaking membranes are suspected, take your temperature and inform your caregiver of this when you call.  · There is vaginal spotting or bleeding. Notify your caregiver of the amount and how many pads are used.  · You develop a bad smelling vaginal discharge with a change in the color.  · You continue to feel sick to your stomach (nauseated) and have no relief from remedies suggested. You vomit blood or coffee ground-like materials.  · You lose more than 2 pounds of weight in 1 week.  · You gain more than 2 pounds of weight in 1 week and you notice swelling of your face, hands, feet, or legs.  · You gain 5 pounds or more in 1 week (even if you do not have swelling of your hands, face, legs, or feet).  · You get exposed to German measles and have never had them.  · You are exposed to fifth disease or chickenpox.  · You develop belly (abdominal) pain. Round ligament discomfort is a common non-cancerous (benign) cause of abdominal pain in pregnancy. Your caregiver still must evaluate this.  · You develop headache, fever, diarrhea, pain with urination, or shortness of breath.  · You fall or are in a car accident or have any kind of trauma.  · There is mental or physical violence in your home.  Document Released: 06/26/2001 Document Revised: 03/26/2012 Document Reviewed: 12/28/2008  [employer]® Patient Information ©2014 [employer].

## 2013-05-21 NOTE — Progress Notes (Signed)
P= 91 Pt. C/o of morning sickness and requests something for nausea.  Discussed appropriate weight gain for this pregnancy; pt. Verbalized understanding. New OB packet given to patient.

## 2013-05-22 LAB — OBSTETRIC PANEL
Antibody Screen: NEGATIVE
Basophils Absolute: 0.1 10*3/uL (ref 0.0–0.1)
Basophils Relative: 1 % (ref 0–1)
Eosinophils Absolute: 0.2 10*3/uL (ref 0.0–0.7)
Eosinophils Relative: 5 % (ref 0–5)
Hemoglobin: 13.6 g/dL (ref 12.0–15.0)
Hepatitis B Surface Ag: NEGATIVE
Lymphs Abs: 2 10*3/uL (ref 0.7–4.0)
MCH: 29.3 pg (ref 26.0–34.0)
MCHC: 36.1 g/dL — ABNORMAL HIGH (ref 30.0–36.0)
Neutro Abs: 1.9 10*3/uL (ref 1.7–7.7)
Neutrophils Relative %: 41 % — ABNORMAL LOW (ref 43–77)
Platelets: 315 10*3/uL (ref 150–400)
RDW: 15 % (ref 11.5–15.5)

## 2013-05-22 LAB — HEMOGLOBINOPATHY EVALUATION
Hemoglobin Other: 35.2 % — ABNORMAL HIGH
Hgb A2 Quant: 2.8 % (ref 2.2–3.2)
Hgb A: 61.8 % — ABNORMAL LOW (ref 96.8–97.8)
Hgb F Quant: 0.2 % (ref 0.0–2.0)

## 2013-05-22 LAB — HIV ANTIBODY (ROUTINE TESTING W REFLEX): HIV: NONREACTIVE

## 2013-05-22 LAB — GC/CHLAMYDIA PROBE AMP: CT Probe RNA: NEGATIVE

## 2013-05-24 LAB — CULTURE, OB URINE: Colony Count: 45000

## 2013-05-25 LAB — PRESCRIPTION MONITORING PROFILE (19 PANEL)
Amphetamine/Meth: NEGATIVE ng/mL
Barbiturate Screen, Urine: NEGATIVE ng/mL
Buprenorphine, Urine: NEGATIVE ng/mL
Carisoprodol, Urine: NEGATIVE ng/mL
Creatinine, Urine: 433.64 mg/dL (ref >20.0)
Meperidine, Ur: NEGATIVE ng/mL
Methadone Screen, Urine: NEGATIVE ng/mL
Nitrites, Initial: NEGATIVE ug/mL
Opiate Screen, Urine: NEGATIVE ng/mL
Oxycodone Screen, Ur: NEGATIVE ng/mL
Phencyclidine, Ur: NEGATIVE ng/mL
Propoxyphene: NEGATIVE ng/mL
Tapentadol, urine: NEGATIVE ng/mL
Zolpidem, Urine: NEGATIVE ng/mL

## 2013-05-25 LAB — CANNABANOIDS (GC/LC/MS), URINE: THC-COOH (GC/LC/MS), ur confirm: 134 ng/mL — AB

## 2013-05-26 ENCOUNTER — Encounter (HOSPITAL_COMMUNITY): Payer: Self-pay | Admitting: *Deleted

## 2013-05-26 ENCOUNTER — Inpatient Hospital Stay (HOSPITAL_COMMUNITY)
Admission: AD | Admit: 2013-05-26 | Discharge: 2013-05-26 | Disposition: A | Discharge status: Home / Self Care | Payer: Self-pay | Source: Ambulatory Visit | Attending: Obstetrics & Gynecology | Admitting: Obstetrics & Gynecology

## 2013-05-26 DIAGNOSIS — O2341 Unspecified infection of urinary tract in pregnancy, first trimester: Secondary | ICD-10-CM

## 2013-05-26 DIAGNOSIS — N390 Urinary tract infection, site not specified: Secondary | ICD-10-CM | POA: Insufficient documentation

## 2013-05-26 DIAGNOSIS — M545 Low back pain, unspecified: Secondary | ICD-10-CM | POA: Insufficient documentation

## 2013-05-26 DIAGNOSIS — R109 Unspecified abdominal pain: Secondary | ICD-10-CM | POA: Insufficient documentation

## 2013-05-26 DIAGNOSIS — B373 Candidiasis of vulva and vagina: Secondary | ICD-10-CM

## 2013-05-26 DIAGNOSIS — O2390 Unspecified genitourinary tract infection in pregnancy, unspecified trimester: Secondary | ICD-10-CM | POA: Insufficient documentation

## 2013-05-26 LAB — URINE MICROSCOPIC-ADD ON

## 2013-05-26 LAB — URINALYSIS, ROUTINE W REFLEX MICROSCOPIC
Glucose, UA: NEGATIVE mg/dL
Ketones, ur: NEGATIVE mg/dL
Protein, ur: 100 mg/dL — AB

## 2013-05-26 LAB — WET PREP, GENITAL

## 2013-05-26 MED ORDER — CEPHALEXIN 500 MG PO CAPS: 500.0000 mg | ORAL_CAPSULE | Freq: Four times a day (QID) | ORAL | Status: DC

## 2013-05-26 MED ORDER — ACETAMINOPHEN 500 MG PO TABS
1000.0000 mg | ORAL_TABLET | Freq: Once | ORAL | Status: AC
  Administered 2013-05-26: 1000 mg via ORAL
  Filled 2013-05-26: qty 2

## 2013-05-26 MED ORDER — FLUCONAZOLE 150 MG PO TABS: 150.0000 mg | ORAL_TABLET | Freq: Once | ORAL | Status: DC

## 2013-05-26 NOTE — MAU Provider Note (Signed)
Attestation of Attending Supervision of Advanced Practitioner (PA/CNM/NP): Evaluation and management procedures were performed by the Advanced Practitioner under my supervision and collaboration.  I have reviewed the Advanced Practitioner's note and chart, and I agree with the management and plan.  Darianne Muralles, MD, FACOG Attending Obstetrician & Gynecologist Faculty Practice, [facility]  

## 2013-05-26 NOTE — MAU Provider Note (Signed)
History     CSN: [id number]  Arrival date and time: 05/26/13 1623   First Provider Initiated Contact with Patient 05/26/13 1712      Chief Complaint  Patient presents with  . Abdominal Pain  . Back Pain   HPI  [name] is a 17 y.o. female G3P1011 at [redacted]w[redacted]d who presents with bilateral lower back pain and bilateral lower abdominal pain. She complains that after she uses the bathroom her abdomen and back will begin to hurt. She notices an orange discharge after she wipes.  She is scheduled to be seen in the clinic in December. She was here about a week ago and had an [place] that showed an IUP.  Pt had intercourse this morning and noticed some pain following intercourse when she attempted to urinate.   OB History   Grav Para Term Preterm Abortions TAB SAB Ect Mult Living   3 1 1  1  1   1       Past Medical History  Diagnosis Date  . Medical history non-contributory   . Cervical incompetence with baby delivered in second trimester 12/04/2012    Needs cerclage and close follow up with subsequent pregnancies     Past Surgical History  Procedure Laterality Date  . No past surgeries    . Dilation and evacuation N/A 12/04/2012    Procedure: DILATATION AND EVACUATION;  Surgeon: Tereso Newcomer, MD;  Location: [facility];  Service: Gynecology;  Laterality: N/A;    History reviewed. No pertinent family history.  History  Substance Use Topics  . Smoking status: Former Smoker    Types: Cigarettes  . Smokeless tobacco: Not on file  . Alcohol Use: No     Comment: ocassionally    Allergies: No Known Allergies  Prescriptions prior to admission  Medication Sig Dispense Refill  . ondansetron (ZOFRAN ODT) 4 MG disintegrating tablet Take 1 tablet (4 mg total) by mouth every 6 (six) hours as needed for nausea.  20 tablet  0  . ranitidine (ZANTAC) 150 MG tablet Take 1 tablet (150 mg total) by mouth 2 (two) times daily.  60 tablet  3    Review of Systems  Constitutional: Negative for  fever and chills.  Gastrointestinal: Positive for nausea, vomiting and abdominal pain. Negative for diarrhea and constipation.  Genitourinary: Positive for flank pain. Negative for dysuria, urgency, frequency and hematuria.       + vaginal discharge. No vaginal bleeding. No dysuria.   Musculoskeletal: Positive for back pain.  Neurological: Negative for headaches.   Physical Exam   Blood pressure 90/59, pulse 77, temperature 97.6 F (36.4 C), temperature source Oral, resp. rate 18, height 5\' 3"  (1.6 m), weight 112 lb 12.8 oz (51.166 kg), last menstrual period 03/24/2013, SpO2 100.00%.  Physical Exam  Constitutional: She is oriented to person, place, and time. She appears well-developed and well-nourished. No distress.  HENT:  Head: Normocephalic.  Eyes: Pupils are equal, round, and reactive to light.  Respiratory: Effort normal.  GI: Soft. She exhibits no distension. There is tenderness. There is no rebound and no guarding.  Left upper quadrant tenderness on palpation.   Genitourinary: Vaginal discharge found.  Speculum exam: Vagina - Moderate amount of creamy discharge, no odor Cervix - No contact bleeding Bimanual exam: Cervix closed Uterus non tender, normal size, gravid  Adnexa non tender, no masses bilaterally GC/Chlam, wet prep done Chaperone present for exam. Speculum exam done by Alllena Day RN   Neurological: She is alert  and oriented to person, place, and time.  Skin: Skin is warm. She is not diaphoretic.  Psychiatric: She has a normal mood and affect. Her behavior is normal.   Results for orders placed during the hospital encounter of 05/26/13 (from the past 24 hour(s))  URINALYSIS, ROUTINE W REFLEX MICROSCOPIC     Status: Abnormal   Collection Time    05/26/13  5:35 PM      Result Value Range   Color, Urine YELLOW  YELLOW   APPearance CLOUDY (*) CLEAR   Specific Gravity, Urine 1.025  1.005 - 1.030   pH 6.0  5.0 - 8.0   Glucose, UA NEGATIVE  NEGATIVE mg/dL    Hgb urine dipstick LARGE (*) NEGATIVE   Bilirubin Urine NEGATIVE  NEGATIVE   Ketones, ur NEGATIVE  NEGATIVE mg/dL   Protein, ur 161 (*) NEGATIVE mg/dL   Urobilinogen, UA 1.0  0.0 - 1.0 mg/dL   Nitrite NEGATIVE  NEGATIVE   Leukocytes, UA MODERATE (*) NEGATIVE  URINE MICROSCOPIC-ADD ON     Status: Abnormal   Collection Time    05/26/13  5:35 PM      Result Value Range   Squamous Epithelial / LPF FEW (*) RARE   WBC, UA TOO NUMEROUS TO COUNT  <3 WBC/hpf   Bacteria, UA FEW (*) RARE   Urine-Other FIELD OBSCURED BY WBC'S    WET PREP, GENITAL     Status: Abnormal   Collection Time    05/26/13  5:53 PM      Result Value Range   Yeast Wet Prep HPF POC MODERATE (*) NONE SEEN   Trich, Wet Prep NONE SEEN  NONE SEEN   Clue Cells Wet Prep HPF POC NONE SEEN  NONE SEEN   WBC, Wet Prep HPF POC MANY (*) NONE SEEN    MAU Course  Procedures None  MDM Tylenol 1 gram in MAU Wet prep and UA Bimanual exam   Niketa Turner Herb Grays, NP  Attestation of Attending Supervision of Advanced Practitioner (PA/CNM/NP): Evaluation and management procedures were performed by the Advanced Practitioner under my supervision and collaboration.  I have reviewed the Advanced Practitioner's note and chart, and I agree with the management and plan.  Assessment and Plan  UA concerning for UTI; will presumptively with Keflex and send for culture Wet prep is positive for yeast, Diflucan prescribed Follow up in clinic as scheduled or earlier if indicated   Jaynie Collins, MD, FACOG Attending Obstetrician & Gynecologist Faculty Practice, [facility]

## 2013-05-26 NOTE — MAU Note (Addendum)
Patient states having lower back and abdominal pain that is cramp like and sharp with movement since yesterday. Reports burning with urination x 4 days.

## 2013-05-27 LAB — URINE CULTURE: Culture: NO GROWTH

## 2013-05-28 LAB — HGB ELECTROPHORESIS REFLEXED REPORT
Hemoglobin A - HGBRFX: 56.6 % — ABNORMAL LOW (ref >96.0)
Hemoglobin Elect C: 40.5 % — ABNORMAL HIGH
Hemoglobin F - HGBRFX: 0.3 % (ref <2.0)
Sickle Solubility Test - HGBRFX: NEGATIVE

## 2013-06-18 ENCOUNTER — Encounter: Payer: Self-pay | Admitting: Obstetrics & Gynecology

## 2013-06-19 ENCOUNTER — Encounter: Payer: Self-pay | Admitting: *Deleted

## 2013-06-23 ENCOUNTER — Other Ambulatory Visit: Payer: Self-pay | Admitting: Obstetrics & Gynecology

## 2013-06-23 DIAGNOSIS — Z3682 Encounter for antenatal screening for nuchal translucency: Secondary | ICD-10-CM

## 2013-06-24 ENCOUNTER — Ambulatory Visit (HOSPITAL_COMMUNITY)
Admission: RE | Admit: 2013-06-24 | Discharge: 2013-06-24 | Disposition: A | Discharge status: Home / Self Care | Payer: Medicaid Other | Source: Ambulatory Visit | Attending: Obstetrics & Gynecology | Admitting: Obstetrics & Gynecology

## 2013-06-24 ENCOUNTER — Ambulatory Visit (HOSPITAL_COMMUNITY): Payer: Medicaid Other

## 2013-07-14 ENCOUNTER — Encounter: Payer: Medicaid Other | Admitting: Family Medicine

## 2013-07-16 NOTE — L&D Delivery Note (Signed)
Attestation of Attending Supervision of Advanced Practitioner (CNM/NP): Evaluation and management procedures were performed by the Advanced Practitioner under my supervision and collaboration. I have reviewed the Advanced Practitioner's note and chart, and I agree with the management and plan.  LEGGETT,KELLY H. 12:00 PM   

## 2013-07-16 NOTE — L&D Delivery Note (Signed)
Delivery Note Called to patient room for delivery. At 8:20 AM a viable female was delivered via Vaginal, Spontaneous Delivery (Presentation: Left Occiput Anterior).  APGAR: 9, 9; weight 6 lb 14.6 oz (3135 g).  Cord clamped x 2 and cut. Active management third stage labor with traction. Pitocin administered after placenta delivered. Placenta status: Intact, Spontaneous.  Cord: 3 vessels with the following complications: None.  Cord pH: n/a. Counts correct. Hemostatic.   Anesthesia: Epidural  Episiotomy: None Lacerations: None Suture Repair: n/a Est. Blood Loss (mL): 300  Mom to postpartum.  Baby to Couplet care / Skin to Skin.  Pamela Curtis, Pamela Curtis 01/05/2014, 9:39 AM

## 2013-07-22 ENCOUNTER — Encounter (HOSPITAL_COMMUNITY): Payer: Self-pay

## 2013-07-22 ENCOUNTER — Inpatient Hospital Stay (HOSPITAL_COMMUNITY): Payer: Medicaid Other

## 2013-07-22 ENCOUNTER — Inpatient Hospital Stay (HOSPITAL_COMMUNITY)
Admission: AD | Admit: 2013-07-22 | Discharge: 2013-07-23 | Disposition: A | Discharge status: Home / Self Care | Payer: Medicaid Other | Source: Ambulatory Visit | Attending: Obstetrics and Gynecology | Admitting: Obstetrics and Gynecology

## 2013-07-22 DIAGNOSIS — D582 Other hemoglobinopathies: Secondary | ICD-10-CM | POA: Insufficient documentation

## 2013-07-22 DIAGNOSIS — O09292 Supervision of pregnancy with other poor reproductive or obstetric history, second trimester: Secondary | ICD-10-CM

## 2013-07-22 DIAGNOSIS — J3489 Other specified disorders of nose and nasal sinuses: Secondary | ICD-10-CM | POA: Insufficient documentation

## 2013-07-22 DIAGNOSIS — R51 Headache: Secondary | ICD-10-CM | POA: Insufficient documentation

## 2013-07-22 DIAGNOSIS — R109 Unspecified abdominal pain: Secondary | ICD-10-CM | POA: Insufficient documentation

## 2013-07-22 DIAGNOSIS — O99891 Other specified diseases and conditions complicating pregnancy: Secondary | ICD-10-CM | POA: Insufficient documentation

## 2013-07-22 DIAGNOSIS — O3430 Maternal care for cervical incompetence, unspecified trimester: Secondary | ICD-10-CM | POA: Insufficient documentation

## 2013-07-22 DIAGNOSIS — O9989 Other specified diseases and conditions complicating pregnancy, childbirth and the puerperium: Principal | ICD-10-CM

## 2013-07-22 DIAGNOSIS — J069 Acute upper respiratory infection, unspecified: Secondary | ICD-10-CM | POA: Insufficient documentation

## 2013-07-22 LAB — URINALYSIS, ROUTINE W REFLEX MICROSCOPIC
Bilirubin Urine: NEGATIVE
GLUCOSE, UA: NEGATIVE mg/dL
Hgb urine dipstick: NEGATIVE
Ketones, ur: 15 mg/dL — AB
Leukocytes, UA: NEGATIVE
Nitrite: NEGATIVE
PH: 7 (ref 5.0–8.0)
Protein, ur: NEGATIVE mg/dL
Specific Gravity, Urine: 1.02 (ref 1.005–1.030)
Urobilinogen, UA: 0.2 mg/dL (ref 0.0–1.0)

## 2013-07-22 LAB — CBC
HEMATOCRIT: 31.6 % — AB (ref 36.0–46.0)
HEMOGLOBIN: 11.6 g/dL — AB (ref 12.0–15.0)
MCH: 29.2 pg (ref 26.0–34.0)
MCHC: 36.7 g/dL — ABNORMAL HIGH (ref 30.0–36.0)
MCV: 79.6 fL (ref 78.0–100.0)
Platelets: 241 10*3/uL (ref 150–400)
RBC: 3.97 MIL/uL (ref 3.87–5.11)
RDW: 13.6 % (ref 11.5–15.5)
WBC: 7.4 10*3/uL (ref 4.0–10.5)

## 2013-07-22 MED ORDER — PSEUDOEPHEDRINE HCL 30 MG PO TABS
60.0000 mg | ORAL_TABLET | Freq: Once | ORAL | Status: AC
Start: 2013-07-22 — End: 2013-07-23
  Administered 2013-07-23: 60 mg via ORAL
  Filled 2013-07-22: qty 2

## 2013-07-22 MED ORDER — IBUPROFEN 600 MG PO TABS
600.0000 mg | ORAL_TABLET | Freq: Once | ORAL | Status: AC
  Administered 2013-07-22: 600 mg via ORAL
  Filled 2013-07-22: qty 1

## 2013-07-22 NOTE — MAU Note (Signed)
C/o headaches x 1 week.  Reports she keeps sneezing and having runny nose.  States mucus d/c x 2 days, a week ago. Denies bleeding. Pt reports she has missed last 3 scheduled appointments at clinic.

## 2013-07-22 NOTE — MAU Provider Note (Signed)
Chief Complaint: Abdominal Pain and Headache  First Provider Initiated Contact with Patient 07/22/13 2250     SUBJECTIVE HPI: Pamela Curtis is a 18 y.o. G3P1011 at [redacted]w[redacted]d by LMP who presents with nasal congestion, sneezing, runny nose, frontal HA x 1 week adn fwe sharp pains at ML of abdomen at and above level of umbilicus today w/ mvmt. None now. Thinks it is where her stomach is stretching w/ the pregnancy. Gets care at [facility], but no-showed for last three appts. Hx 18 week loss due to incompetent cervix 11/2012. Instructed to start [facility] early and that she should discuss cerclage, but has not discussed at appt yet.   Denies fever, chills, VB, Vaginal discharge, low abd pain, cough, sore throat, weakness, difficulties w/ speech or gait, urinary complaints or GI complaints. Mild sinus pressure.  Has not tried anything for her Sx. HA 5/10. Feels like constant pressure.   Past Medical History  Diagnosis Date  . Medical history non-contributory   . Cervical incompetence with baby delivered in second trimester 12/04/2012    Needs cerclage and close follow up with subsequent pregnancies    OB History  Gravida Para Term Preterm AB SAB TAB Ectopic Multiple Living  3 1 1  1 1    1     # Outcome Date GA Lbr Len/2nd Weight Sex Delivery Anes PTL Lv  3 CUR           2 SAB 11/2012 [redacted]w[redacted]d       ND     Comments: System Generated. Please review and update pregnancy details.  1 TRM 07/08/10 [redacted]w[redacted]d  2.551 kg (5 lb 10 oz) F SVD EPI  Y     Comments: No complications      Past Surgical History  Procedure Laterality Date  . No past surgeries    . Dilation and evacuation N/A 12/04/2012    Procedure: DILATATION AND EVACUATION;  Surgeon: Tereso Newcomer, MD;  Location: [facility];  Service: Gynecology;  Laterality: N/A;   History   Social History  . Marital Status: Single    Spouse Name: N/A    Number of Children: N/A  . Years of Education: N/A   Occupational History  . Not on file.   Social History Main  Topics  . Smoking status: Current Every Day Smoker -- 0.25 packs/day    Types: Cigarettes  . Smokeless tobacco: Not on file  . Alcohol Use: No     Comment: ocassionally  . Drug Use: No  . Sexual Activity: Yes    Birth Control/ Protection: None   Other Topics Concern  . Not on file   Social History Narrative  . No narrative on file   No current facility-administered medications on file prior to encounter.   Current Outpatient Prescriptions on File Prior to Encounter  Medication Sig Dispense Refill  . cephALEXin (KEFLEX) 500 MG capsule Take 1 capsule (500 mg total) by mouth 4 (four) times daily.  28 capsule  2  . fluconazole (DIFLUCAN) 150 MG tablet Take 1 tablet (150 mg total) by mouth once.  1 tablet  3  . ondansetron (ZOFRAN ODT) 4 MG disintegrating tablet Take 1 tablet (4 mg total) by mouth every 6 (six) hours as needed for nausea.  20 tablet  0  . ranitidine (ZANTAC) 150 MG tablet Take 1 tablet (150 mg total) by mouth 2 (two) times daily.  60 tablet  3   No Known Allergies  ROS: Pertinent items in HPI  OBJECTIVE  Blood pressure 103/61, pulse 102, temperature 98.7 F (37.1 C), temperature source Oral, resp. rate 18, height 5' 2.5" (1.588 m), weight 53.524 kg (118 lb), last menstrual period 03/24/2013, SpO2 100.00%. Patient Vitals for the past 24 hrs:  BP Temp Temp src Pulse Resp SpO2 Height Weight  07/23/13 0045 - 98 F (36.7 C) Oral - - - - -  07/23/13 0007 98/56 mmHg - - 84 18 - - -  07/22/13 2210 103/61 mmHg 98.7 F (37.1 C) Oral 102 18 100 % 5' 2.5" (1.588 m) 53.524 kg (118 lb)    GENERAL: Well-developed, well-nourished female in no acute distress.  HEENT: Normocephalic. Congested. Mild frontal sinus tenderness. Throat erythematous, otherwise clear.  HEART: normal rate RESP: normal effort ABDOMEN: Soft, non-tender, S=D. EXTREMITIES: Nontender, no edema NEURO: Alert and oriented SPECULUM EXAM: Deferred.  FHR 145 by doppler  LAB RESULTS Results for orders placed  during the hospital encounter of 07/22/13 (from the past 24 hour(s))  URINALYSIS, ROUTINE W REFLEX MICROSCOPIC     Status: Abnormal   Collection Time    07/22/13 10:14 PM      Result Value Range   Color, Urine YELLOW  YELLOW   APPearance CLEAR  CLEAR   Specific Gravity, Urine 1.020  1.005 - 1.030   pH 7.0  5.0 - 8.0   Glucose, UA NEGATIVE  NEGATIVE mg/dL   Hgb urine dipstick NEGATIVE  NEGATIVE   Bilirubin Urine NEGATIVE  NEGATIVE   Ketones, ur 15 (*) NEGATIVE mg/dL   Protein, ur NEGATIVE  NEGATIVE mg/dL   Urobilinogen, UA 0.2  0.0 - 1.0 mg/dL   Nitrite NEGATIVE  NEGATIVE   Leukocytes, UA NEGATIVE  NEGATIVE  CBC     Status: Abnormal   Collection Time    07/22/13 11:00 PM      Result Value Range   WBC 7.4  4.0 - 10.5 K/uL   RBC 3.97  3.87 - 5.11 MIL/uL   Hemoglobin 11.6 (*) 12.0 - 15.0 g/dL   HCT 16.1 (*) 09.6 - 04.5 %   MCV 79.6  78.0 - 100.0 fL   MCH 29.2  26.0 - 34.0 pg   MCHC 36.7 (*) 30.0 - 36.0 g/dL   RDW 40.9  81.1 - 91.4 %   Platelets 241  150 - 400 K/uL   IMAGING CL 3 cm.  MAU COURSE Sx improved w/ Ibuprofen and Sudafed.   ASSESSMENT 1. Viral URI   2. History of incompetent cervix, currently pregnant, second trimester    PLAN Discharge home in stable condition. Start vaginal progesterone per Dr. Emelda Fear due to hx incompetent cervix. Message to WOC to schedule appt w/ Surgeon ASAP to discuss cerclage. Pelvic rest. Incompetent cervix warning signs.   Follow-up Information   Follow up with [facility] On 07/29/2013. (as scheduled or as needed if symptoms worsen)    Specialty:  Obstetrics and Gynecology   Contact information:   [address] [place] [address] [phone]      Follow up with [facility]. (may move your appointment to an earlier time to discuss and possibly schedule cerclage)    Specialty:  Obstetrics and Gynecology   Contact information:   [address] [place] [address] [phone]      Follow  up with [facility]. (As needed in emergencies)    Contact information:   [address] [id number] [place] [address] [phone]  Medication List    STOP taking these medications       cephALEXin 500 MG capsule  Commonly known as:  KEFLEX     fluconazole 150 MG tablet  Commonly known as:  DIFLUCAN     ondansetron 4 MG disintegrating tablet  Commonly known as:  ZOFRAN ODT     ranitidine 150 MG tablet  Commonly known as:  ZANTAC      TAKE these medications       CONCEPT OB 130-92.4-1 MG Caps  Take 1 tablet by mouth daily.     ibuprofen 600 MG tablet  Commonly known as:  ADVIL,MOTRIN  Take 1 tablet (600 mg total) by mouth every 6 (six) hours as needed for fever, headache, mild pain or moderate pain. Do not use after [redacted] weeks gestation.     progesterone 200 MG capsule  Commonly known as:  PROMETRIUM  Place 1 capsule (200 mg total) vaginally at bedtime as needed.     pseudoephedrine 30 MG tablet  Commonly known as:  SUDAFED  Take 1-2 tablets (30-60 mg total) by mouth every 4 (four) hours as needed for congestion.       [place] Donyae Kohn, [place] 07/22/2013  10:32 PM

## 2013-07-22 NOTE — MAU Note (Signed)
Pt reports headache everyday x 1 week, nasal congestion,  Sharp lower abd pain and vaginal discharge

## 2013-07-23 DIAGNOSIS — J069 Acute upper respiratory infection, unspecified: Secondary | ICD-10-CM

## 2013-07-23 MED ORDER — PROGESTERONE MICRONIZED 200 MG PO CAPS: 200.0000 mg | ORAL_CAPSULE | Freq: Every evening | ORAL | Status: DC | PRN

## 2013-07-23 MED ORDER — CONCEPT OB 130-92.4-1 MG PO CAPS: 1.0000 | ORAL_CAPSULE | Freq: Every day | ORAL | Status: DC

## 2013-07-23 MED ORDER — PSEUDOEPHEDRINE HCL 30 MG PO TABS: 30.0000 mg | ORAL_TABLET | ORAL | Status: DC | PRN

## 2013-07-23 MED ORDER — IBUPROFEN 600 MG PO TABS: 600.0000 mg | ORAL_TABLET | Freq: Four times a day (QID) | ORAL | Status: DC | PRN

## 2013-07-23 MED ORDER — PROGESTERONE 200 MG VA SUPP
200.0000 mg | Freq: Once | VAGINAL | Status: DC
Start: 2013-07-23 — End: 2013-07-23
  Filled 2013-07-23: qty 1

## 2013-07-23 MED ORDER — PROGESTERONE MICRONIZED 200 MG PO CAPS
200.0000 mg | ORAL_CAPSULE | Freq: Once | ORAL | Status: AC
  Administered 2013-07-23: 200 mg via VAGINAL
  Filled 2013-07-23: qty 1

## 2013-07-23 MED ORDER — PROGESTERONE MICRONIZED 200 MG PO CAPS: 200.0000 mg | ORAL_CAPSULE | Freq: Once | ORAL | Status: DC

## 2013-07-23 NOTE — MAU Provider Note (Signed)
Attestation of Attending Supervision of Advanced Practitioner: Evaluation and management procedures were performed by the PA/NP/CNM/OB Fellow under my supervision/collaboration. Chart reviewed and agree with management and plan.  Cartina Brousseau V 07/23/2013 9:40 AM

## 2013-07-23 NOTE — Discharge Instructions (Signed)
Cervical Insufficiency  Cervical insufficiency is when the cervix is weak and starts to open (dilate) and thin (efface) before the pregnancy is at term and without labor starting. This is also called incompetent cervix. It can happen in the second or third trimester when the fetus starts putting pressure on the cervix. Cervical insufficiency can lead to a miscarriage, preterm premature rupture of the membranes (PPROM), or having the baby early (preterm birth).  RISK FACTORS You may be more likely to develop cervical insufficiency if:  You have a shorter cervix than normal.  Damage or injury occurred to your cervix from a past pregnancy or surgery.  You were born with a cervical defect.  You have had procedure done on the cervix, such as cervical biopsy.  You have a history of cervical insufficiency.  You have a history of PPROM.  You have ended several past pregnancies through abortion.  You were exposed to the drug diethylstilbestrol (DES). SYMPTOMS Often times, women do not have any symptoms. Other times, woman may only have mild symptoms that often start between week 14 through 20. The symptoms may last several days or weeks. These symptoms include:  Light spotting or bleeding from the vagina.  Pelvic pressure.  A change in vaginal discharge, such as discharge that changes from clear, white, or light yellow to pink or tan.  Back pain.  Abdominal pain or cramping. DIAGNOSIS Cervical insufficiency cannot be diagnosed before you become pregnant. Once you are pregnant, your caregiver will ask about your medical history and if you have had any problems in past pregnancies. Tell your caregiver about any procedures performed on your cervix or if you have a history of miscarriages or cervical insufficiency. If your caregiver thinks you are at high risk for cervical insufficiency or show signs of cervical insufficiency, he or she may:  Perform a pelvic exam. This will check for:  The  presence of the membranes (amniotic sac) coming out of the cervix.  Cervical abnormalities.  Cervical injuries.  The presence of contractions.  Perform an ultrasonography (commonly called ultrasound) to measure the length and thickness of the cervix. TREATMENT If you have been diagnosed with cervical insufficiency, your caregiver may recommend:  Limiting physical activity.  Bed rest at home or in the hospital.  Pelvic rest, which means no sexual intercourse or placing anything in the vagina.  Cerclage to sew the cervix closed and prevent it from opening too early. The stitches (sutures) are removed between weeks 36 and 38 to avoid problems during labor. Cerclage may be recommended during pregnancy if you have had a history of miscarriages or preterm births without a known cause. It may also be recommended if you have a short cervix that was identified by ultrasound or if your caregiver has found that your cervix has dilated before 24 weeks of pregnancy. Limiting physical activity and bed rest may or may not help prevent a preterm birth. WHEN SHOULD YOU SEEK IMMEDIATE MEDICAL CARE?  Seek immediate medical care if you show any symptoms of cervical insufficiency. You will need to go to the hospital to get checked immediately. Document Released: 07/02/2005 Document Revised: 03/04/2013 Document Reviewed: 09/08/2012 [facility] Patient Information 2014 [place], [place].  Cerclage Cerclage of the cervix is a surgical procedure for an incompetent cervix. An incompetent cervix is a weak cervix that opens up before labor begins. Cerclage of the cervix sews the cervix closed during pregnancy.  LET [facility] CARE PROVIDER KNOW ABOUT:   Any allergies you have.  All medicines you are taking, including vitamins, herbs, eye drops, creams, and over-the-counter medicines.  Previous problems you or members of your family have had with the use of anesthetics.  Any blood disorders you have.  Previous  surgeries you have had.  Medical conditions you have.  Any recent colds or infections. RISKS AND COMPLICATIONS  Generally, this is a safe procedure. However, as with any procedure, complications can occur. Possible complications include:  Infection.  Bleeding.  Rupturing the amniotic sac (membranes).  Going into early labor and delivery.  Problems with the anesthesia.  Infection of the amniotic sac. BEFORE THE PROCEDURE   Ask your health care provider about changing or stopping your medicines.  Do not eat or drink anything for 6 8 hours before the procedure.  Arrange for someone to drive you home after the procedure. PROCEDURE   An IV tube will be placed in your vein. You will be given a sedative to help you relax. You will be given a medicine that makes you sleep through the procedure (general anesthetic) or a medicine injected into your spine that numbs your body below the waist (spinal or epidural anesthetic). You will be asleep or be numbed through the entire procedure.  A speculum will be placed in vagina to visualize your cervix.  The cervix is then grasped and stitched closed tightly.  Ultrasound may be used to guide the procedure and monitor the baby. AFTER THE PROCEDURE   You will go to a recovery room where you and your unborn baby are monitored. Once you are awake, stable, and taking fluids well, you will be allowed to return to your room.  You will usually stay in the hospital overnight.  You may get an injection of progesterone to prevent uterine contractions.  You may be given pain-relieving medicines to take with you when you go home.  Have someone drive you home and stay with you for up to 2 days. Document Released: 06/14/2008 Document Revised: 03/04/2013 Document Reviewed: 01/21/2013 [facility] Patient Information 2014 [place].  Upper Respiratory Infection, Adult An upper respiratory infection (URI) is also known as the common cold. It is often  caused by a type of germ (virus). Colds are easily spread (contagious). You can pass it to others by kissing, coughing, sneezing, or drinking out of the same glass. Usually, you get better in 1 or 2 weeks.  HOME CARE   Only take medicine as told by your doctor.  Use a warm mist humidifier or breathe in steam from a hot shower.  Drink enough water and fluids to keep your pee (urine) clear or pale yellow.  Get plenty of rest.  Return to work when your temperature is back to normal or as told by your doctor. You may use a face mask and wash your hands to stop your cold from spreading. GET HELP RIGHT AWAY IF:   After the first few days, you feel you are getting worse.  You have questions about your medicine.  You have chills, shortness of breath, or brown or red spit (mucus).  You have yellow or brown snot (nasal discharge) or pain in the face, especially when you bend forward.  You have a fever, puffy (swollen) neck, pain when you swallow, or white spots in the back of your throat.  You have a bad headache, ear pain, sinus pain, or chest pain.  You have a high-pitched whistling sound when you breathe in and out (wheezing).  You have a lasting cough or  cough up blood.  You have sore muscles or a stiff neck. MAKE SURE YOU:   Understand these instructions.  Will watch your condition.  Will get help right away if you are not doing well or get worse. Document Released: 12/19/2007 Document Revised: 09/24/2011 Document Reviewed: 11/06/2010 [facility] Patient Information 2014 [place], [place]. Medicines During Pregnancy During pregnancy, there are medicines that are either safe or unsafe to take. Medicines include prescriptions from your caregiver, over-the-counter medicines, topical creams applied to the skin, and all herbal substances. Medicines are put into either Class A, B, C, or D. Class A and B medicines have been shown to be safe in pregnancy. Class C medicines are also considered  to be safe in pregnancy, but these medicines should only be used when necessary. Class D medicines should not be used at all in pregnancy. They can be harmful to a baby.  It is best to take as little medicine as possible while pregnant. However, some medicines are necessary to take for the mother and baby's health. Sometimes, it is more dangerous to stop taking certain medicines than to stay on them. This is often the case for people with long-term (chronic) conditions such as asthma, diabetes, or high blood pressure (hypertension). If you are pregnant and have a chronic illness, call your caregiver right away. Bring a list of your medicines and their doses to your appointments. If you are planning to become pregnant, schedule a doctor's appointment and discuss your medicines with your caregiver. Lastly, write down the phone number to your pharmacist. They can answer questions regarding a medicine's class and safety. They cannot give advice as to whether you should or should not be on a medicine.  SAFE AND UNSAFE MEDICINES There is a long list of medicines that are considered safe in pregnancy. Below is a shorter list. For specific medicines, ask your caregiver.  AllergyMedicines Loratadine, cetirizine, and chlorpheniramine are safe to take. Certain nasal steroid sprays are safe. Talk to your caregiver about specific brands that are safe. Analgesics Acetaminophen and acetaminophen with codeine are safe to take. All other nonsteroidal anti-inflammatory drugs (NSAIDS) are not safe. This includes ibuprofen.  Antacids Many over-the-counter antacids are safe to take. Talk to your caregiver about specific brands that are safe. Famotidine, ranitidine, and lansoprazole are safe. Omepresole is considered safe to take in the second trimester. Antibiotic Medicines There are several antibiotics to avoid. These include, but are not limited to, tetracyline, quinolones, and sulfa medications. Talk to your caregiver  before taking any antibiotic.  Antihistamines Talk to your caregiver about specific brands that are safe.  Asthma Medicines Most asthma steroid inhalers are safe to take. Talk to your caregiver for specific details. Calcium Calcium supplements are safe to take. Do not take oyster shell calcium.  Cough and Cold Medicines It is safe to take products with guaifenesin or dextromethorphan. Talk to your caregiver about specific brands that are safe. It is not safe to take products that contain aspirin or ibuprofen. Decongestant Medicines Pseudoephedrine-containing products are safe to take in the second and third trimester.  Depression Medicines Talk about these medicines with your caregiver.  Antidiarrheal Medicines It is safe to take loperamide. Talk to your caregiver about specific brands that are safe. It is not safe to take any antidiarrheal medicine that contains bismuth. Eyedrops Allergy eyedrops should be limited.  Iron It is safe to use certain iron-containing medicines for anemia in pregnancy. They require a prescription.  Antinausea Medicines It is safe to take  doxylamine and vitamin B6 as directed. There are other prescription medicines available, if needed.  Sleep aids It is safe to take diphenhydramine and acetaminophen with diphenhydramine.  Steroids Hydrocortisone creams are safe to use as directed. Oral steroids require a prescription. It is not safe to take any hemorrhoid cream with pramoxine or phenylephrine. Stool softener It is safe to take stool softener medicines. Avoid daily or prolonged use of stool softeners. Thyroid Medicine It is important to stay on this thyroid medicine. It needs to be followed by your caregiver.  Vaginal Medicines Your caregiver will prescribe a medicine to you if you have a vaginal infection. Certain antifungal medicines are safe to use if you have a sexually transmitted infection (STI). Talk to your caregiver.  Document  Released: 07/02/2005 Document Revised: 09/24/2011 Document Reviewed: 07/03/2011 [facility] Patient Information 2014 [place], [place].

## 2013-07-27 ENCOUNTER — Other Ambulatory Visit: Payer: Self-pay | Admitting: Advanced Practice Midwife

## 2013-07-27 DIAGNOSIS — O09292 Supervision of pregnancy with other poor reproductive or obstetric history, second trimester: Secondary | ICD-10-CM

## 2013-07-27 DIAGNOSIS — O09892 Supervision of other high risk pregnancies, second trimester: Secondary | ICD-10-CM

## 2013-07-27 NOTE — Progress Notes (Signed)
Needs F/U CL and anatomy scan 2 weeks from 07/22/13 per MFM.

## 2013-07-29 ENCOUNTER — Encounter: Payer: Medicaid Other | Admitting: Advanced Practice Midwife

## 2013-07-30 ENCOUNTER — Ambulatory Visit (INDEPENDENT_AMBULATORY_CARE_PROVIDER_SITE_OTHER): Payer: Medicaid Other | Admitting: Family Medicine

## 2013-07-30 ENCOUNTER — Encounter: Payer: Self-pay | Admitting: Family Medicine

## 2013-07-30 VITALS — BP 95/64 | Temp 96.9°F | Wt 117.5 lb

## 2013-07-30 DIAGNOSIS — O0991 Supervision of high risk pregnancy, unspecified, first trimester: Secondary | ICD-10-CM

## 2013-07-30 DIAGNOSIS — Z23 Encounter for immunization: Secondary | ICD-10-CM

## 2013-07-30 DIAGNOSIS — O3430 Maternal care for cervical incompetence, unspecified trimester: Secondary | ICD-10-CM

## 2013-07-30 LAB — POCT URINALYSIS DIP (DEVICE)
Bilirubin Urine: NEGATIVE
GLUCOSE, UA: NEGATIVE mg/dL
Hgb urine dipstick: NEGATIVE
KETONES UR: NEGATIVE mg/dL
Leukocytes, UA: NEGATIVE
NITRITE: NEGATIVE
Protein, ur: NEGATIVE mg/dL
Specific Gravity, Urine: >=1.03 (ref 1.005–1.030)
Urobilinogen, UA: 0.2 mg/dL (ref 0.0–1.0)
pH: 6 (ref 5.0–8.0)

## 2013-07-30 NOTE — Progress Notes (Signed)
Pt. Has not been in clinic since 05/21/13.  Seen in MAU.  Has h/o 18 wk loss with + chlamydia.  Has been counseled for cerclage and placed on Prometrium which she has not started. CL was 3 cm on 1/7. Flu shot today Quad screen today Schedule cerclage--08/03/13 at 9:30 am Schedule anatomy.

## 2013-07-30 NOTE — Addendum Note (Signed)
Addended by: Reva BoresPRATT, Corah Willeford S on: 07/30/2013 12:12 PM   Modules accepted: Orders

## 2013-07-30 NOTE — Progress Notes (Signed)
Anatomy scan scheduled for 08/20/13 at 1115

## 2013-07-30 NOTE — Patient Instructions (Signed)
Second Trimester of Pregnancy The second trimester is from week 13 through week 28, months 4 through 6. The second trimester is often a time when you feel your best. Your body has also adjusted to being pregnant, and you begin to feel better physically. Usually, morning sickness has lessened or quit completely, you may have more energy, and you may have an increase in appetite. The second trimester is also a time when the fetus is growing rapidly. At the end of the sixth month, the fetus is about 9 inches long and weighs about 1 pounds. You will likely begin to feel the baby move (quickening) between 18 and 20 weeks of the pregnancy. BODY CHANGES Your body goes through many changes during pregnancy. The changes vary from woman to woman.   Your weight will continue to increase. You will notice your lower abdomen bulging out.  You may begin to get stretch marks on your hips, abdomen, and breasts.  You may develop headaches that can be relieved by medicines approved by your caregiver.  You may urinate more often because the fetus is pressing on your bladder.  You may develop or continue to have heartburn as a result of your pregnancy.  You may develop constipation because certain hormones are causing the muscles that push waste through your intestines to slow down.  You may develop hemorrhoids or swollen, bulging veins (varicose veins).  You may have back pain because of the weight gain and pregnancy hormones relaxing your joints between the bones in your pelvis and as a result of a shift in weight and the muscles that support your balance.  Your breasts will continue to grow and be tender.  Your gums may bleed and may be sensitive to brushing and flossing.  Dark spots or blotches (chloasma, mask of pregnancy) may develop on your face. This will likely fade after the baby is born.  A dark line from your belly button to the pubic area (linea nigra) may appear. This will likely fade after  the baby is born. WHAT TO EXPECT AT YOUR PRENATAL VISITS During a routine prenatal visit:  You will be weighed to make sure you and the fetus are growing normally.  Your blood pressure will be taken.  Your abdomen will be measured to track your baby's growth.  The fetal heartbeat will be listened to.  Any test results from the previous visit will be discussed. Your caregiver may ask you:  How you are feeling.  If you are feeling the baby move.  If you have had any abnormal symptoms, such as leaking fluid, bleeding, severe headaches, or abdominal cramping.  If you have any questions. Other tests that may be performed during your second trimester include:  Blood tests that check for:  Low iron levels (anemia).  Gestational diabetes (between 24 and 28 weeks).  Rh antibodies.  Urine tests to check for infections, diabetes, or protein in the urine.  An ultrasound to confirm the proper growth and development of the baby.  An amniocentesis to check for possible genetic problems.  Fetal screens for spina bifida and Down syndrome. HOME CARE INSTRUCTIONS   Avoid all smoking, herbs, alcohol, and unprescribed drugs. These chemicals affect the formation and growth of the baby.  Follow your caregiver's instructions regarding medicine use. There are medicines that are either safe or unsafe to take during pregnancy.  Exercise only as directed by your caregiver. Experiencing uterine cramps is a good sign to stop exercising.  Continue to eat regular,   healthy meals.  Wear a good support bra for breast tenderness.  Do not use hot tubs, steam rooms, or saunas.  Wear your seat belt at all times when driving.  Avoid raw meat, uncooked cheese, cat litter boxes, and soil used by cats. These carry germs that can cause birth defects in the baby.  Take your prenatal vitamins.  Try taking a stool softener (if your caregiver approves) if you develop constipation. Eat more high-fiber  foods, such as fresh vegetables or fruit and whole grains. Drink plenty of fluids to keep your urine clear or pale yellow.  Take warm sitz baths to soothe any pain or discomfort caused by hemorrhoids. Use hemorrhoid cream if your caregiver approves.  If you develop varicose veins, wear support hose. Elevate your feet for 15 minutes, 3 4 times a day. Limit salt in your diet.  Avoid heavy lifting, wear low heel shoes, and practice good posture.  Rest with your legs elevated if you have leg cramps or low back pain.  Visit your dentist if you have not gone yet during your pregnancy. Use a soft toothbrush to brush your teeth and be gentle when you floss.  A sexual relationship may be continued unless your caregiver directs you otherwise.  Continue to go to all your prenatal visits as directed by your caregiver. SEEK MEDICAL CARE IF:   You have dizziness.  You have mild pelvic cramps, pelvic pressure, or nagging pain in the abdominal area.  You have persistent nausea, vomiting, or diarrhea.  You have a bad smelling vaginal discharge.  You have pain with urination. SEEK IMMEDIATE MEDICAL CARE IF:   You have a fever.  You are leaking fluid from your vagina.  You have spotting or bleeding from your vagina.  You have severe abdominal cramping or pain.  You have rapid weight gain or loss.  You have shortness of breath with chest pain.  You notice sudden or extreme swelling of your face, hands, ankles, feet, or legs.  You have not felt your baby move in over an hour.  You have severe headaches that do not go away with medicine.  You have vision changes. Document Released: 06/26/2001 Document Revised: 03/04/2013 Document Reviewed: 09/02/2012 [employer] Patient Information 2014 [employer].  Breastfeeding Deciding to breastfeed is one of the best choices you can make for you and your baby. A change in hormones during pregnancy causes your breast tissue to grow and increases the  number and size of your milk ducts. These hormones also allow proteins, sugars, and fats from your blood supply to make breast milk in your milk-producing glands. Hormones prevent breast milk from being released before your baby is born as well as prompt milk flow after birth. Once breastfeeding has begun, thoughts of your baby, as well as his or her sucking or crying, can stimulate the release of milk from your milk-producing glands.  BENEFITS OF BREASTFEEDING For Your Baby  Your first milk (colostrum) helps your baby's digestive system function better.   There are antibodies in your milk that help your baby fight off infections.   Your baby has a lower incidence of asthma, allergies, and sudden infant death syndrome.   The nutrients in breast milk are better for your baby than infant formulas and are designed uniquely for your baby's needs.   Breast milk improves your baby's brain development.   Your baby is less likely to develop other conditions, such as childhood obesity, asthma, or type 2 diabetes mellitus.  For   You   Breastfeeding helps to create a very special bond between you and your baby.   Breastfeeding is convenient. Breast milk is always available at the correct temperature and costs nothing.   Breastfeeding helps to burn calories and helps you lose the weight gained during pregnancy.   Breastfeeding makes your uterus contract to its prepregnancy size faster and slows bleeding (lochia) after you give birth.   Breastfeeding helps to lower your risk of developing type 2 diabetes mellitus, osteoporosis, and breast or ovarian cancer later in life. SIGNS THAT YOUR BABY IS HUNGRY Early Signs of Hunger  Increased alertness or activity.  Stretching.  Movement of the head from side to side.  Movement of the head and opening of the mouth when the corner of the mouth or cheek is stroked (rooting).  Increased sucking sounds, smacking lips, cooing, sighing, or  squeaking.  Hand-to-mouth movements.  Increased sucking of fingers or hands. Late Signs of Hunger  Fussing.  Intermittent crying. Extreme Signs of Hunger Signs of extreme hunger will require calming and consoling before your baby will be able to breastfeed successfully. Do not wait for the following signs of extreme hunger to occur before you initiate breastfeeding:   Restlessness.  A loud, strong cry.   Screaming. BREASTFEEDING BASICS Breastfeeding Initiation  Find a comfortable place to sit or lie down, with your neck and back well supported.  Place a pillow or rolled up blanket under your baby to bring him or her to the level of your breast (if you are seated). Nursing pillows are specially designed to help support your arms and your baby while you breastfeed.  Make sure that your baby's abdomen is facing your abdomen.   Gently massage your breast. With your fingertips, massage from your chest wall toward your nipple in a circular motion. This encourages milk flow. You may need to continue this action during the feeding if your milk flows slowly.  Support your breast with 4 fingers underneath and your thumb above your nipple. Make sure your fingers are well away from your nipple and your baby's mouth.   Stroke your baby's lips gently with your finger or nipple.   When your baby's mouth is open wide enough, quickly bring your baby to your breast, placing your entire nipple and as much of the colored area around your nipple (areola) as possible into your baby's mouth.   More areola should be visible above your baby's upper lip than below the lower lip.   Your baby's tongue should be between his or her lower gum and your breast.   Ensure that your baby's mouth is correctly positioned around your nipple (latched). Your baby's lips should create a seal on your breast and be turned out (everted).  It is common for your baby to suck about 2 3 minutes in order to start the  flow of breast milk. Latching Teaching your baby how to latch on to your breast properly is very important. An improper latch can cause nipple pain and decreased milk supply for you and poor weight gain in your baby. Also, if your baby is not latched onto your nipple properly, he or she may swallow some air during feeding. This can make your baby fussy. Burping your baby when you switch breasts during the feeding can help to get rid of the air. However, teaching your baby to latch on properly is still the best way to prevent fussiness from swallowing air while breastfeeding. Signs that your baby has   successfully latched on to your nipple:    Silent tugging or silent sucking, without causing you pain.   Swallowing heard between every 3 4 sucks.    Muscle movement above and in front of his or her ears while sucking.  Signs that your baby has not successfully latched on to nipple:   Sucking sounds or smacking sounds from your baby while breastfeeding.  Nipple pain. If you think your baby has not latched on correctly, slip your finger into the corner of your baby's mouth to break the suction and place it between your baby's gums. Attempt breastfeeding initiation again. Signs of Successful Breastfeeding Signs from your baby:   A gradual decrease in the number of sucks or complete cessation of sucking.   Falling asleep.   Relaxation of his or her body.   Retention of a small amount of milk in his or her mouth.   Letting go of your breast by himself or herself. Signs from you:  Breasts that have increased in firmness, weight, and size 1 3 hours after feeding.   Breasts that are softer immediately after breastfeeding.  Increased milk volume, as well as a change in milk consistency and color by the 5th day of breastfeeding.   Nipples that are not sore, cracked, or bleeding. Signs That Your Baby is Getting Enough Milk  Wetting at least 3 diapers in a 24-hour period. The urine  should be clear and pale yellow by age 5 days.  At least 3 stools in a 24-hour period by age 5 days. The stool should be soft and yellow.  At least 3 stools in a 24-hour period by age 7 days. The stool should be seedy and yellow.  No loss of weight greater than 10% of birth weight during the first 3 days of age.  Average weight gain of 4 7 ounces (120 210 mL) per week after age 4 days.  Consistent daily weight gain by age 5 days, without weight loss after the age of 2 weeks. After a feeding, your baby may spit up a small amount. This is common. BREASTFEEDING FREQUENCY AND DURATION Frequent feeding will help you make more milk and can prevent sore nipples and breast engorgement. Breastfeed when you feel the need to reduce the fullness of your breasts or when your baby shows signs of hunger. This is called "breastfeeding on demand." Avoid introducing a pacifier to your baby while you are working to establish breastfeeding (the first 4 6 weeks after your baby is born). After this time you may choose to use a pacifier. Research has shown that pacifier use during the first year of a baby's life decreases the risk of sudden infant death syndrome (SIDS). Allow your baby to feed on each breast as long as he or she wants. Breastfeed until your baby is finished feeding. When your baby unlatches or falls asleep while feeding from the first breast, offer the second breast. Because newborns are often sleepy in the first few weeks of life, you may need to awaken your baby to get him or her to feed. Breastfeeding times will vary from baby to baby. However, the following rules can serve as a guide to help you ensure that your baby is properly fed:  Newborns (babies 4 weeks of age or younger) may breastfeed every 1 3 hours.  Newborns should not go longer than 3 hours during the day or 5 hours during the night without breastfeeding.  You should breastfeed your baby a minimum of   8 times in a 24-hour period until  you begin to introduce solid foods to your baby at around 6 months of age. BREAST MILK PUMPING Pumping and storing breast milk allows you to ensure that your baby is exclusively fed your breast milk, even at times when you are unable to breastfeed. This is especially important if you are going back to work while you are still breastfeeding or when you are not able to be present during feedings. Your lactation consultant can give you guidelines on how long it is safe to store breast milk.  A breast pump is a machine that allows you to pump milk from your breast into a sterile bottle. The pumped breast milk can then be stored in a refrigerator or freezer. Some breast pumps are operated by hand, while others use electricity. Ask your lactation consultant which type will work best for you. Breast pumps can be purchased, but some hospitals and breastfeeding support groups lease breast pumps on a monthly basis. A lactation consultant can teach you how to hand express breast milk, if you prefer not to use a pump.  CARING FOR YOUR BREASTS WHILE YOU BREASTFEED Nipples can become dry, cracked, and sore while breastfeeding. The following recommendations can help keep your breasts moisturized and healthy:  Avoid using soap on your nipples.   Wear a supportive bra. Although not required, special nursing bras and tank tops are designed to allow access to your breasts for breastfeeding without taking off your entire bra or top. Avoid wearing underwire style bras or extremely tight bras.  Air dry your nipples for 3 4minutes after each feeding.   Use only cotton bra pads to absorb leaked breast milk. Leaking of breast milk between feedings is normal.   Use lanolin on your nipples after breastfeeding. Lanolin helps to maintain your skin's normal moisture barrier. If you use pure lanolin you do not need to wash it off before feeding your baby again. Pure lanolin is not toxic to your baby. You may also hand express a  few drops of breast milk and gently massage that milk into your nipples and allow the milk to air dry. In the first few weeks after giving birth, some women experience extremely full breasts (engorgement). Engorgement can make your breasts feel heavy, warm, and tender to the touch. Engorgement peaks within 3 5 days after you give birth. The following recommendations can help ease engorgement:  Completely empty your breasts while breastfeeding or pumping. You may want to start by applying warm, moist heat (in the shower or with warm water-soaked hand towels) just before feeding or pumping. This increases circulation and helps the milk flow. If your baby does not completely empty your breasts while breastfeeding, pump any extra milk after he or she is finished.  Wear a snug bra (nursing or regular) or tank top for 1 2 days to signal your body to slightly decrease milk production.  Apply ice packs to your breasts, unless this is too uncomfortable for you.  Make sure that your baby is latched on and positioned properly while breastfeeding. If engorgement persists after 48 hours of following these recommendations, contact your health care provider or a lactation consultant. OVERALL HEALTH CARE RECOMMENDATIONS WHILE BREASTFEEDING  Eat healthy foods. Alternate between meals and snacks, eating 3 of each per day. Because what you eat affects your breast milk, some of the foods may make your baby more irritable than usual. Avoid eating these foods if you are sure that they are   negatively affecting your baby.  Drink milk, fruit juice, and water to satisfy your thirst (about 10 glasses a day).   Rest often, relax, and continue to take your prenatal vitamins to prevent fatigue, stress, and anemia.  Continue breast self-awareness checks.  Avoid chewing and smoking tobacco.  Avoid alcohol and drug use. Some medicines that may be harmful to your baby can pass through breast milk. It is important to ask your  health care provider before taking any medicine, including all over-the-counter and prescription medicine as well as vitamin and herbal supplements. It is possible to become pregnant while breastfeeding. If birth control is desired, ask your health care provider about options that will be safe for your baby. SEEK MEDICAL CARE IF:   You feel like you want to stop breastfeeding or have become frustrated with breastfeeding.  You have painful breasts or nipples.  Your nipples are cracked or bleeding.  Your breasts are red, tender, or warm.  You have a swollen area on either breast.  You have a fever or chills.  You have nausea or vomiting.  You have drainage other than breast milk from your nipples.  Your breasts do not become full before feedings by the 5th day after you give birth.  You feel sad and depressed.  Your baby is too sleepy to eat well.  Your baby is having trouble sleeping.   Your baby is wetting less than 3 diapers in a 24-hour period.  Your baby has less than 3 stools in a 24-hour period.  Your baby's skin or the white part of his or her eyes becomes yellow.   Your baby is not gaining weight by 5 days of age. SEEK IMMEDIATE MEDICAL CARE IF:   Your baby is overly tired (lethargic) and does not want to wake up and feed.  Your baby develops an unexplained fever. Document Released: 07/02/2005 Document Revised: 03/04/2013 Document Reviewed: 12/24/2012 [employer] Patient Information 2014 [employer].  

## 2013-07-30 NOTE — Progress Notes (Signed)
Pulse: 79

## 2013-07-30 NOTE — H&P (Signed)
Pamela Curtis is an 18 y.o. G3P1011 7070w3d female.   Chief Complaint: incompetent cervix HPI: H/o spontaneous 18 wk loss last pregnancy.  Has been counseled about possible cerclage and she desires this.    Past Medical History  Diagnosis Date  . Medical history non-contributory   . Cervical incompetence with baby delivered in second trimester 12/04/2012    Needs cerclage and close follow up with subsequent pregnancies     Past Surgical History  Procedure Laterality Date  . No past surgeries    . Dilation and evacuation N/A 12/04/2012    Procedure: DILATATION AND EVACUATION;  Surgeon: Tereso NewcomerUgonna A Anyanwu, MD;  Location: [facility];  Service: Gynecology;  Laterality: N/A;    No family history on file.  Social History:  reports that she has been smoking Cigarettes.  She has been smoking about 0.25 packs per day. She does not have any smokeless tobacco history on file. She reports that she does not drink alcohol or use illicit drugs.  Allergies: No Known Allergies  No current facility-administered medications on file prior to encounter.   Current Outpatient Prescriptions on File Prior to Encounter  Medication Sig Dispense Refill  . Prenat w/o A Vit-FeFum-FePo-FA (CONCEPT OB) 130-92.4-1 MG CAPS Take 1 tablet by mouth daily.  30 capsule  12  . progesterone (PROMETRIUM) 200 MG capsule Place 1 capsule (200 mg total) vaginally at bedtime as needed.  30 capsule  1    Pertinent items are noted in HPI.  Last menstrual period 03/24/2013. LMP 03/24/2013 General appearance: alert, cooperative and appears stated age Head: Normocephalic, without obvious abnormality, atraumatic Neck: supple, symmetrical, trachea midline Lungs: clear to auscultation bilaterally Heart: regular rate and rhythm Abdomen: soft, non-tender; bowel sounds normal; no masses,  no organomegaly Extremities: extremities normal, atraumatic, no cyanosis or edema Skin: Skin color, texture, turgor normal. No rashes or  lesions Neurologic: Grossly normal   Lab Results  Component Value Date   WBC 7.4 07/22/2013   HGB 11.6* 07/22/2013   HCT 31.6* 07/22/2013   MCV 79.6 07/22/2013   PLT 241 07/22/2013   Lab Results  Component Value Date   PREGTESTUR NEGATIVE 05/02/2013     Assessment/Plan Patient Active Problem List   Diagnosis Date Noted  . Supervision of high risk pregnancy in first trimester 05/21/2013    Priority: High  . 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  Nikolos Billig S 07/30/2013, 10:36 AM

## 2013-07-31 ENCOUNTER — Encounter (HOSPITAL_COMMUNITY): Payer: Self-pay

## 2013-08-03 ENCOUNTER — Encounter (HOSPITAL_COMMUNITY)
Admission: RE | Disposition: A | Discharge status: Home / Self Care | Payer: Self-pay | Source: Ambulatory Visit | Attending: Family Medicine

## 2013-08-03 ENCOUNTER — Encounter (HOSPITAL_COMMUNITY): Payer: Medicaid Other | Admitting: Anesthesiology

## 2013-08-03 ENCOUNTER — Encounter: Payer: Self-pay | Admitting: Family Medicine

## 2013-08-03 ENCOUNTER — Ambulatory Visit (HOSPITAL_COMMUNITY): Payer: Medicaid Other | Admitting: Anesthesiology

## 2013-08-03 ENCOUNTER — Encounter (HOSPITAL_COMMUNITY): Payer: Self-pay | Admitting: *Deleted

## 2013-08-03 ENCOUNTER — Ambulatory Visit (HOSPITAL_COMMUNITY)
Admission: RE | Admit: 2013-08-03 | Discharge: 2013-08-03 | Disposition: A | Discharge status: Home / Self Care | Payer: Medicaid Other | Source: Ambulatory Visit | Attending: Family Medicine | Admitting: Family Medicine

## 2013-08-03 DIAGNOSIS — O3430 Maternal care for cervical incompetence, unspecified trimester: Secondary | ICD-10-CM | POA: Diagnosis present

## 2013-08-03 HISTORY — PX: CERVICAL CERCLAGE: SHX1329

## 2013-08-03 LAB — AFP, QUAD SCREEN
AFP: 16.8 IU/mL
Age Alone: 1:1190 {titer}
Curr Gest Age: 17.3 wks.days
Down Syndrome Scr Risk Est: 1:1600 {titer}
HCG TOTAL: 22727 m[IU]/mL
INH: 164.2 pg/mL
Interpretation-AFP: NEGATIVE
MOM FOR HCG: 0.9
MoM for AFP: 0.37
MoM for INH: 0.85
OPEN SPINA BIFIDA: NEGATIVE
TRI 18 SCR RISK EST: NEGATIVE
Trisomy 18 (Edward) Syndrome Interp.: 1:13200 {titer}
uE3 Mom: 1.35
uE3 Value: 0.9 ng/mL

## 2013-08-03 LAB — CBC
HCT: 31.2 % — ABNORMAL LOW (ref 36.0–46.0)
Hemoglobin: 11.8 g/dL — ABNORMAL LOW (ref 12.0–15.0)
MCH: 30 pg (ref 26.0–34.0)
MCHC: 37.8 g/dL — AB (ref 30.0–36.0)
MCV: 79.4 fL (ref 78.0–100.0)
PLATELETS: 262 10*3/uL (ref 150–400)
RBC: 3.93 MIL/uL (ref 3.87–5.11)
RDW: 13.3 % (ref 11.5–15.5)
WBC: 6.8 10*3/uL (ref 4.0–10.5)

## 2013-08-03 SURGERY — CERCLAGE, CERVIX, VAGINAL APPROACH
Anesthesia: Spinal | Site: Vagina

## 2013-08-03 MED ORDER — LACTATED RINGERS IV SOLN
INTRAVENOUS | Status: DC
  Administered 2013-08-03: 09:00:00 via INTRAVENOUS

## 2013-08-03 MED ORDER — BUPIVACAINE HCL (PF) 0.5 % IJ SOLN
INTRAMUSCULAR | Status: AC
  Filled 2013-08-03: qty 30

## 2013-08-03 MED ORDER — LIDOCAINE IN DEXTROSE 5-7.5 % IV SOLN
INTRAVENOUS | Status: AC
  Filled 2013-08-03: qty 2

## 2013-08-03 MED ORDER — EPHEDRINE 5 MG/ML INJ
INTRAVENOUS | Status: AC
  Filled 2013-08-03: qty 10

## 2013-08-03 MED ORDER — BUPIVACAINE HCL (PF) 0.25 % IJ SOLN
INTRAMUSCULAR | Status: DC | PRN
  Administered 2013-08-03: 20 mL

## 2013-08-03 MED ORDER — LACTATED RINGERS IV SOLN
INTRAVENOUS | Status: DC
Start: 2013-08-03 — End: 2013-08-03

## 2013-08-03 MED ORDER — FENTANYL CITRATE 0.05 MG/ML IJ SOLN
INTRAMUSCULAR | Status: DC | PRN
  Administered 2013-08-03 (×2): 50 ug via INTRAVENOUS

## 2013-08-03 MED ORDER — PROGESTERONE MICRONIZED 200 MG PO CAPS: 200.0000 mg | ORAL_CAPSULE | Freq: Every day | ORAL | Status: DC

## 2013-08-03 MED ORDER — INDOMETHACIN 25 MG SUPPOSITORY: 25.0000 mg | Freq: Four times a day (QID) | RECTAL | Status: DC

## 2013-08-03 MED ORDER — FENTANYL CITRATE 0.05 MG/ML IJ SOLN
INTRAMUSCULAR | Status: AC
  Filled 2013-08-03: qty 2

## 2013-08-03 MED ORDER — EPHEDRINE SULFATE 50 MG/ML IJ SOLN
INTRAMUSCULAR | Status: DC | PRN
  Administered 2013-08-03 (×2): 10 mg via INTRAVENOUS

## 2013-08-03 MED ORDER — BUPIVACAINE HCL (PF) 0.25 % IJ SOLN
INTRAMUSCULAR | Status: AC
  Filled 2013-08-03: qty 30

## 2013-08-03 SURGICAL SUPPLY — 23 items
CATH ROBINSON RED A/P 16FR (CATHETERS) IMPLANT
CLOTH BEACON ORANGE TIMEOUT ST (SAFETY) ×3 IMPLANT
COUNTER NEEDLE 1200 MAGNETIC (NEEDLE) IMPLANT
GLOVE BIOGEL PI IND STRL 7.0 (GLOVE) ×1 IMPLANT
GLOVE BIOGEL PI INDICATOR 7.0 (GLOVE) ×2
GLOVE ECLIPSE 7.0 STRL STRAW (GLOVE) ×6 IMPLANT
GOWN PREVENTION PLUS XLARGE (GOWN DISPOSABLE) ×3 IMPLANT
GOWN STRL REIN XL XLG (GOWN DISPOSABLE) ×6 IMPLANT
NDL SPNL 22GX3.5 QUINCKE BK (NEEDLE) IMPLANT
NEEDLE MAYO .5 CIRCLE (NEEDLE) ×3 IMPLANT
NEEDLE SPNL 22GX3.5 QUINCKE BK (NEEDLE) IMPLANT
NS IRRIG 1000ML POUR BTL (IV SOLUTION) ×2 IMPLANT
PACK VAGINAL MINOR WOMEN LF (CUSTOM PROCEDURE TRAY) ×3 IMPLANT
PAD OB MATERNITY 4.3X12.25 (PERSONAL CARE ITEMS) ×3 IMPLANT
PAD PREP 24X48 CUFFED NSTRL (MISCELLANEOUS) ×3 IMPLANT
SUT MERSILENE 5MM BP 1 12 (SUTURE) ×3 IMPLANT
SYR BULB IRRIGATION 50ML (SYRINGE) ×1 IMPLANT
SYR CONTROL 10ML LL (SYRINGE) IMPLANT
TOWEL OR 17X24 6PK STRL BLUE (TOWEL DISPOSABLE) ×6 IMPLANT
TUBING NON-CON 1/4 X 20 CONN (TUBING) IMPLANT
TUBING NON-CON 1/4 X 20' CONN (TUBING)
WATER STERILE IRR 1000ML POUR (IV SOLUTION) ×1 IMPLANT
YANKAUER SUCT BULB TIP NO VENT (SUCTIONS) IMPLANT

## 2013-08-03 NOTE — Anesthesia Preprocedure Evaluation (Signed)
Anesthesia Evaluation  Patient identified by MRN, date of birth, ID band Patient awake    Reviewed: Allergy & Precautions, H&P , Patient's Chart, lab work & pertinent test results  Airway Mallampati: II TM Distance: >3 FB Neck ROM: full    Dental no notable dental hx.    Pulmonary Current Smoker,  breath sounds clear to auscultation  Pulmonary exam normal       Cardiovascular Exercise Tolerance: Good Rhythm:regular Rate:Normal     Neuro/Psych    GI/Hepatic   Endo/Other    Renal/GU      Musculoskeletal   Abdominal   Peds  Hematology   Anesthesia Other Findings   Reproductive/Obstetrics                           Anesthesia Physical Anesthesia Plan  ASA: II  Anesthesia Plan: Spinal   Post-op Pain Management:    Induction:   Airway Management Planned:   Additional Equipment:   Intra-op Plan:   Post-operative Plan:   Informed Consent: I have reviewed the patients History and Physical, chart, labs and discussed the procedure including the risks, benefits and alternatives for the proposed anesthesia with the patient or authorized representative who has indicated his/her understanding and acceptance.   Dental Advisory Given  Plan Discussed with: CRNA  Anesthesia Plan Comments: (Lab work confirmed with CRNA in room. Platelets okay. Discussed spinal anesthetic, and patient consents to the procedure:  included risk of possible headache,backache, failed block, allergic reaction, and nerve injury. This patient was asked if she had any questions or concerns before the procedure started. )        Anesthesia Quick Evaluation  

## 2013-08-03 NOTE — Anesthesia Procedure Notes (Addendum)
Anesthesia Regional Block:   Narrative:    Spinal  Patient location during procedure: OR Preanesthetic Checklist Completed: patient identified, site marked, surgical consent, pre-op evaluation, timeout performed, IV checked, risks and benefits discussed and monitors and equipment checked Spinal Block Patient position: sitting Prep: DuraPrep Patient monitoring: heart rate, cardiac monitor, continuous pulse ox and blood pressure Approach: midline Location: L3-4 Injection technique: single-shot Needle Needle type: Sprotte  Needle gauge: 24 G Needle length: 9 cm Assessment Sensory level: T4 Additional Notes Spinal Dosage in OR                  Xylocaine ml       1.0

## 2013-08-03 NOTE — Anesthesia Postprocedure Evaluation (Signed)
  Anesthesia Post-op Note  Patient: Pamela Curtis  Procedure(s) Performed: Procedure(s): CERCLAGE CERVICAL (N/A)  Patient Location: PACU  Anesthesia Type:Spinal  Level of Consciousness: awake, alert  and oriented  Airway and Oxygen Therapy: Patient Spontanous Breathing  Post-op Pain: none  Post-op Assessment: Post-op Vital signs reviewed, Patient's Cardiovascular Status Stable, Respiratory Function Stable, Patent Airway, No signs of Nausea or vomiting, Pain level controlled, No headache and No backache  Post-op Vital Signs: Reviewed and stable  Complications: No apparent anesthesia complications

## 2013-08-03 NOTE — H&P (Deleted)
  Pamela Curtis is an 18 y.o. [phone] 7418w0d female.   Chief Complaint: Incompetent Cervix  HPI: H/o of 18 wk loss last pregnancy with PPROM. Desires cerclage this pregnancy.  Has not started prometrium.  Past Medical History  Diagnosis Date  . Medical history non-contributory   . Cervical incompetence with baby delivered in second trimester 12/04/2012    Needs cerclage and close follow up with subsequent pregnancies     Past Surgical History  Procedure Laterality Date  . No past surgeries    . Dilation and evacuation N/A 12/04/2012    Procedure: DILATATION AND EVACUATION;  Surgeon: Tereso NewcomerUgonna A Anyanwu, MD;  Location: [facility];  Service: Gynecology;  Laterality: N/A;    History reviewed. No pertinent family history. Social History:  reports that she has been smoking Cigarettes.  She has been smoking about 0.25 packs per day. She has never used smokeless tobacco. She reports that she drinks alcohol. She reports that she does not use illicit drugs.  Allergies: No Known Allergies  No prescriptions prior to admission     Pertinent items are noted in HPI.  Last menstrual period 03/24/2013. LMP 03/24/2013 Head: Normocephalic, without obvious abnormality, atraumatic Neck: supple, symmetrical, trachea midline Lungs: clear to auscultation bilaterally Heart: regular rate and rhythm Abdomen: soft, non-tender; bowel sounds normal; no masses,  no organomegaly, gravid Extremities: extremities normal, atraumatic, no cyanosis or edema Skin: Skin color, texture, turgor normal. No rashes or lesions Neurologic: Grossly normal   Lab Results  Component Value Date   WBC 7.4 07/22/2013   HGB 11.6* 07/22/2013   HCT 31.6* 07/22/2013   MCV 79.6 07/22/2013   PLT 241 07/22/2013   Lab Results  Component Value Date   PREGTESTUR NEGATIVE 05/02/2013     Assessment/Plan Patient Active Problem List   Diagnosis Date Noted  . Supervision of high risk pregnancy in first trimester 05/21/2013    Priority: High   . 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 Risks include but are not limited to bleeding, infection, injury to surrounding structures, including bladder and ureters, blood clots, and death.  Likelihood of success is moderate.   [name],[name] 08/03/2013, 8:09 AM

## 2013-08-03 NOTE — Interval H&P Note (Deleted)
History and Physical Interval Note:  08/03/2013 9:16 AM  Pamela Curtis  has presented today for surgery, with the diagnosis of  Incompetent cervix  The various methods of treatment have been discussed with the patient and family. After consideration of risks, benefits and other options for treatment, the patient has consented to  Procedure(s): CERCLAGE CERVICAL (N/A) as a surgical intervention .  The patient's history has been reviewed, patient examined, no change in status, stable for surgery.  I have reviewed the patient's chart and labs.  Questions were answered to the patient's satisfaction.     Kimble Delaurentis S   

## 2013-08-03 NOTE — Discharge Instructions (Signed)
DISCHARGE INSTRUCTIONS: Cervical Ceclage The following instructions have been prepared to help you care for yourself upon your return home.   Personal hygiene:  Use sanitary pads for vaginal drainage, not tampons.  Shower the day after your procedure.  NO tub baths, pools or Jacuzzis for 2 weeks.  Wipe front to back after using the bathroom.  Activity and limitations:  Do NOT drive or operate any equipment for 24 hours. The effects of anesthesia are still present and drowsiness may result.  Do NOT rest in bed all day.  Walking is encouraged.  Walk up and down stairs slowly.  You may resume your normal activity in one to two days or as indicated by your physician.  Sexual activity: NO intercourse for at least until you have been to follow up appoint, or as indicated by your physician.  Diet:  You may resume your usual diet.  Return to work: You may resume your work activities in one to two days or as indicated by your doctor.  What to expect after your surgery: Expect to have vaginal bleeding/discharge for 2-3 days and spotting for up to 10 days. It is not unusual to have soreness for up to 1-2 weeks. You may have a slight burning sensation when you urinate for the first day. Mild cramps may continue for a couple of days. You may have a regular period in 2-6 weeks.  Call your doctor for any of the following:  Excessive vaginal bleeding, saturating and changing one pad every hour.  Inability to urinate 6 hours after discharge from hospital.  Pain not relieved by pain medication.  Fever of 100.4 F or greater.  Unusual vaginal discharge or odor.   Call for an appointment:    Patients signature: ______________________  Nurses signature ________________________  Support person's signature_______________________    Cerclage Cerclage of the cervix is a surgical procedure for an incompetent cervix. An incompetent cervix is a weak cervix that opens up before labor  begins. Cerclage of the cervix sews the cervix closed during pregnancy.  LET [facility] KNOW ABOUT:   Any allergies you have.  All medicines you are taking, including vitamins, herbs, eye drops, creams, and over-the-counter medicines.  Previous problems you or members of your family have had with the use of anesthetics.  Any blood disorders you have.  Previous surgeries you have had.  Medical conditions you have.  Any recent colds or infections. RISKS AND COMPLICATIONS  Generally, this is a safe procedure. However, as with any procedure, complications can occur. Possible complications include:  Infection.  Bleeding.  Rupturing the amniotic sac (membranes).  Going into early labor and delivery.  Problems with the anesthesia.  Infection of the amniotic sac. BEFORE THE PROCEDURE   Ask your health care provider about changing or stopping your medicines.  Do not eat or drink anything for 6 8 hours before the procedure.  Arrange for someone to drive you home after the procedure. PROCEDURE   An IV tube will be placed in your vein. You will be given a sedative to help you relax. You will be given a medicine that makes you sleep through the procedure (general anesthetic) or a medicine injected into your spine that numbs your body below the waist (spinal or epidural anesthetic). You will be asleep or be numbed through the entire procedure.  A speculum will be placed in vagina to visualize your cervix.  The cervix is then grasped and stitched closed tightly.  Ultrasound may be  used to guide the procedure and monitor the baby. AFTER THE PROCEDURE   You will go to a recovery room where you and your unborn baby are monitored. Once you are awake, stable, and taking fluids well, you will be allowed to return to your room.  You will usually stay in the hospital overnight.  You may get an injection of progesterone to prevent uterine contractions.  You may be given  pain-relieving medicines to take with you when you go home.  Have someone drive you home and stay with you for up to 2 days. Document Released: 06/14/2008 Document Revised: 03/04/2013 Document Reviewed: 01/21/2013 [facility] Patient Information 2014 [place], [place].  Cervical Insufficiency  Cervical insufficiency is when the cervix is weak and starts to open (dilate) and thin (efface) before the pregnancy is at term and without labor starting. This is also called incompetent cervix. It can happen in the second or third trimester when the fetus starts putting pressure on the cervix. Cervical insufficiency can lead to a miscarriage, preterm premature rupture of the membranes (PPROM), or having the baby early (preterm birth).  RISK FACTORS You may be more likely to develop cervical insufficiency if:  You have a shorter cervix than normal.  Damage or injury occurred to your cervix from a past pregnancy or surgery.  You were born with a cervical defect.  You have had procedure done on the cervix, such as cervical biopsy.  You have a history of cervical insufficiency.  You have a history of PPROM.  You have ended several past pregnancies through abortion.  You were exposed to the drug diethylstilbestrol (DES). SYMPTOMS Often times, women do not have any symptoms. Other times, woman may only have mild symptoms that often start between week 14 through 20. The symptoms may last several days or weeks. These symptoms include:  Light spotting or bleeding from the vagina.  Pelvic pressure.  A change in vaginal discharge, such as discharge that changes from clear, white, or light yellow to pink or tan.  Back pain.  Abdominal pain or cramping. DIAGNOSIS Cervical insufficiency cannot be diagnosed before you become pregnant. Once you are pregnant, your caregiver will ask about your medical history and if you have had any problems in past pregnancies. Tell your caregiver about any procedures  performed on your cervix or if you have a history of miscarriages or cervical insufficiency. If your caregiver thinks you are at high risk for cervical insufficiency or show signs of cervical insufficiency, he or she may:  Perform a pelvic exam. This will check for:  The presence of the membranes (amniotic sac) coming out of the cervix.  Cervical abnormalities.  Cervical injuries.  The presence of contractions.  Perform an ultrasonography (commonly called ultrasound) to measure the length and thickness of the cervix. TREATMENT If you have been diagnosed with cervical insufficiency, your caregiver may recommend:  Limiting physical activity.  Bed rest at home or in the hospital.  Pelvic rest, which means no sexual intercourse or placing anything in the vagina.  Cerclage to sew the cervix closed and prevent it from opening too early. The stitches (sutures) are removed between weeks 36 and 38 to avoid problems during labor. Cerclage may be recommended during pregnancy if you have had a history of miscarriages or preterm births without a known cause. It may also be recommended if you have a short cervix that was identified by ultrasound or if your caregiver has found that your cervix has dilated before 24 weeks of  pregnancy. Limiting physical activity and bed rest may or may not help prevent a preterm birth. WHEN SHOULD YOU SEEK IMMEDIATE MEDICAL CARE?  Seek immediate medical care if you show any symptoms of cervical insufficiency. You will need to go to the hospital to get checked immediately. Document Released: 07/02/2005 Document Revised: 03/04/2013 Document Reviewed: 09/08/2012 [facility] Patient Information 2014 [place], [place].

## 2013-08-03 NOTE — Interval H&P Note (Deleted)
History and Physical Interval Note:  08/03/2013 9:16 AM  Pamela Curtis  has presented today for surgery, with the diagnosis of  Incompetent cervix  The various methods of treatment have been discussed with the patient and family. After consideration of risks, benefits and other options for treatment, the patient has consented to  Procedure(s): CERCLAGE CERVICAL (N/A) as a surgical intervention .  The patient's history has been reviewed, patient examined, no change in status, stable for surgery.  I have reviewed the patient's chart and labs.  Questions were answered to the patient's satisfaction.     Trenton Verne S

## 2013-08-03 NOTE — Transfer of Care (Signed)
Immediate Anesthesia Transfer of Care Note  Patient: Pamela Curtis  Procedure(s) Performed: Procedure(s): CERCLAGE CERVICAL (N/A)  Patient Location: PACU  Anesthesia Type:Spinal  Level of Consciousness: awake, alert  and oriented  Airway & Oxygen Therapy: Patient Spontanous Breathing  Post-op Assessment: Report given to PACU RN and Post -op Vital signs reviewed and stable  Post vital signs: Reviewed and stable  Complications: No apparent anesthesia complications

## 2013-08-03 NOTE — Op Note (Signed)
Preoperative diagnosis: Incompetent cervix   Postoperative diagnosis: Same  Procedure: Cervical cerclage  Surgeon: Tinnie Gensanya Cordarrell Sane, M.D.  Anesthesia: Sonny DandySpinal-Kyle Jackson, MD, Paracervical block  Findings: 1 cm, soft cervix.  Estimated blood loss: 50 cc  Specimens: None  Reason for procedure: Joseph ArtAspen S Ferner G3P1011 4916w0d, Previous 18 wk loss with PPROM.  Procedure: Patient was taken to the operating room where spinal analgesia was administered. She was prepped and draped in the usual sterile fashion.  A Foley catheter is used to drain her bladder. A timeout was performed. The patient had SCDs in place prior to procedure. The patient was in dorsal lithotomy.  A weighted speculum was placed inside the vagina.  A Deaver was used anteriorly. A paracervical block was placed with 0.25% Marcaine. The cervix was grasped with an open ring  forcep. A 5 mm Mersilene band on a cutting needle was used and to put in a pursestring suture. This was started at 12:00 and exiting at 9:00, starting at 9:00 and exiting at 6:00, starting at 6:00 and exiting at 3:00, starting at 3:00 and exiting at 12:00. A suture was then tied down. All instrument, needle and lap counts were correct x 2. The patient was taken to recovery in stable condition.  Kell Ferris SMD 08/03/2013 10:02 AM

## 2013-08-03 NOTE — Interval H&P Note (Signed)
History and Physical Interval Note:  08/03/2013 9:17 AM  Pamela Curtis  has presented today for surgery, with the diagnosis of  Incompetent cervix  The various methods of treatment have been discussed with the patient and family. After consideration of risks, benefits and other options for treatment, the patient has consented to  Procedure(s): CERCLAGE CERVICAL (N/A) as a surgical intervention .  The patient's history has been reviewed, patient examined, no change in status, stable for surgery.  I have reviewed the patient's chart and labs.  Questions were answered to the patient's satisfaction.     [name],[name]

## 2013-08-04 ENCOUNTER — Inpatient Hospital Stay (HOSPITAL_COMMUNITY)
Admission: AD | Admit: 2013-08-04 | Discharge: 2013-08-05 | Disposition: A | Discharge status: Home / Self Care | Payer: Medicaid Other | Source: Ambulatory Visit | Attending: Obstetrics & Gynecology | Admitting: Obstetrics & Gynecology

## 2013-08-04 ENCOUNTER — Encounter (HOSPITAL_COMMUNITY): Payer: Self-pay | Admitting: Family Medicine

## 2013-08-04 DIAGNOSIS — O3430 Maternal care for cervical incompetence, unspecified trimester: Secondary | ICD-10-CM | POA: Insufficient documentation

## 2013-08-04 DIAGNOSIS — O9989 Other specified diseases and conditions complicating pregnancy, childbirth and the puerperium: Secondary | ICD-10-CM

## 2013-08-04 DIAGNOSIS — O26892 Other specified pregnancy related conditions, second trimester: Secondary | ICD-10-CM

## 2013-08-04 DIAGNOSIS — R519 Headache, unspecified: Secondary | ICD-10-CM

## 2013-08-04 DIAGNOSIS — M549 Dorsalgia, unspecified: Secondary | ICD-10-CM | POA: Insufficient documentation

## 2013-08-04 DIAGNOSIS — O99891 Other specified diseases and conditions complicating pregnancy: Secondary | ICD-10-CM | POA: Insufficient documentation

## 2013-08-04 DIAGNOSIS — R630 Anorexia: Secondary | ICD-10-CM | POA: Insufficient documentation

## 2013-08-04 DIAGNOSIS — O99330 Smoking (tobacco) complicating pregnancy, unspecified trimester: Secondary | ICD-10-CM | POA: Insufficient documentation

## 2013-08-04 DIAGNOSIS — R51 Headache: Secondary | ICD-10-CM | POA: Insufficient documentation

## 2013-08-04 NOTE — MAU Provider Note (Signed)
Chief Complaint: No chief complaint on file.  First Provider Initiated Contact with Patient 08/05/13 0047     SUBJECTIVE HPI: Pamela Curtis is a 18 y.o. G3P1011 at [redacted]w[redacted]d by LMP who presents with HA and back pain since last night. History of 18 week loss secondary to incompetent cervix last year. Cerclage placed yesterday. Prescribed Indocin and Prometrium, but has not picked up prescriptions.  Describes headache as gradual onset, frontal, constant, associated with decreased appetite and sensitivity to light and sound. Very little oral intake today, but no nausea or vomiting. Rates pain 9/10 on pain scale. No improvement with extra strength Tylenol. Describes mid, low back pain as intermittent, sore, 9/10 on pain scale at worst. Less now. No improvement with extra strength Tylenol. No history of similar headaches.  Has had brown, mucoid discharge when she wipes ever since cerclage placement yesterday. Denies intercourse since cerclage placement and denies leaking of fluid or pelvic pressure.   Past Medical History  Diagnosis Date  . Medical history non-contributory   . Cervical incompetence with baby delivered in second trimester 12/04/2012    Needs cerclage and close follow up with subsequent pregnancies    OB History  Gravida Para Term Preterm AB SAB TAB Ectopic Multiple Living  3 1 1  1 1    1     # Outcome Date GA Lbr Len/2nd Weight Sex Delivery Anes PTL Lv  3 CUR           2 SAB 11/2012 [redacted]w[redacted]d       ND     Comments: System Generated. Please review and update pregnancy details.  1 TRM 07/08/10 [redacted]w[redacted]d  2.551 kg (5 lb 10 oz) F SVD EPI  Y     Comments: No complications      Past Surgical History  Procedure Laterality Date  . No past surgeries    . Dilation and evacuation N/A 12/04/2012    Procedure: DILATATION AND EVACUATION;  Surgeon: Tereso Newcomer, MD;  Location: [facility];  Service: Gynecology;  Laterality: N/A;  . Cervical cerclage N/A 08/03/2013    Procedure: CERCLAGE CERVICAL;   Surgeon: Reva Bores, MD;  Location: [facility];  Service: Gynecology;  Laterality: N/A;   History   Social History  . Marital Status: Single    Spouse Name: N/A    Number of Children: N/A  . Years of Education: N/A   Occupational History  . Not on file.   Social History Main Topics  . Smoking status: Current Every Day Smoker -- 0.25 packs/day    Types: Cigarettes  . Smokeless tobacco: Never Used  . Alcohol Use: Yes     Comment: ocassionally  . Drug Use: No  . Sexual Activity: Yes    Birth Control/ Protection: None   Other Topics Concern  . Not on file   Social History Narrative  . No narrative on file   No current facility-administered medications on file prior to encounter.   No current outpatient prescriptions on file prior to encounter.   No Known Allergies  ROS: Pertinent positive items in HPI. Negative for fever, chills, vision changes, leaking of fluid, pelvic pressure, urinary complaints, nausea, vomiting, diarrhea, constipation, neck pain or stiffness, weakness, difficulties with speech or gait, URI, allergy symptoms or thunderclap onset of headache.  OBJECTIVE Blood pressure 102/68, pulse 99, temperature 98.9 F (37.2 C), temperature source Oral, resp. rate 20, height 5\' 2"  (1.575 m), weight 54.999 kg (121 lb 4 oz), last menstrual period 03/24/2013.  GENERAL: Well-developed, well-nourished female in mild distress.  HEENT: Normocephalic. Sinuses nontender. Neck nontender, normal range of motion. no congestion or rhinorrhea. HEART: normal rate RESP: normal effort ABDOMEN: Soft, non-tender BACK: Mild mid, low back tenderness to palpation. No CVA tenderness. EXTREMITIES: Nontender, no edema NEURO: Alert and oriented SPECULUM EXAM: NEFG, small amount of tan, mucoid, odorless discharge. No frank blood noted, cervix clean, visibly closed. Cerclage seen. Negative pooling.  BIMANUAL: cervix closed and long; uterus 18-week size, no adnexal tenderness or masses.  Fetal  heart rate 135 by Doppler.  LAB RESULTS Results for orders placed during the hospital encounter of 08/04/13 (from the past 24 hour(s))  URINALYSIS, ROUTINE W REFLEX MICROSCOPIC     Status: Abnormal   Collection Time    08/05/13 12:08 AM      Result Value Range   Color, Urine YELLOW  YELLOW   APPearance HAZY (*) CLEAR   Specific Gravity, Urine 1.015  1.005 - 1.030   pH 7.5  5.0 - 8.0   Glucose, UA NEGATIVE  NEGATIVE mg/dL   Hgb urine dipstick NEGATIVE  NEGATIVE   Bilirubin Urine NEGATIVE  NEGATIVE   Ketones, ur NEGATIVE  NEGATIVE mg/dL   Protein, ur NEGATIVE  NEGATIVE mg/dL   Urobilinogen, UA 0.2  0.0 - 1.0 mg/dL   Nitrite NEGATIVE  NEGATIVE   Leukocytes, UA NEGATIVE  NEGATIVE  CBC     Status: Abnormal   Collection Time    08/05/13 12:25 AM      Result Value Range   WBC 7.0  4.0 - 10.5 K/uL   RBC 3.89  3.87 - 5.11 MIL/uL   Hemoglobin 11.4 (*) 12.0 - 15.0 g/dL   HCT 16.1 (*) 09.6 - 04.5 %   MCV 79.2  78.0 - 100.0 fL   MCH 29.3  26.0 - 34.0 pg   MCHC 37.0 (*) 30.0 - 36.0 g/dL   RDW 40.9  81.1 - 91.4 %   Platelets 234  150 - 400 K/uL  POCT FERN TEST     Status: None   Collection Time    08/05/13  1:23 AM      Result Value Range   POCT Fern Test Negative = intact amniotic membranes      IMAGING Transvaginal ultrasound: Cervical length 3.7 cm. Cerclage visualized. Cephalic presentation. Fetal heart rate 142.  MAU COURSE Ultrasound for cervical length, CBC, ibuprofen, push PO fluids.  0245: Back pain resolved. HA not present w/ lying down, but still present when sitting up. Fioricet ordered.  HA resolved. Ready for D/C.   ASSESSMENT 1. Incompetent cervix in pregnancy, antepartum   2. Pregnancy headache in second trimester--migraine without aura     PLAN Discharge home in stable condition. Strongly encourage patient to take Prometrium and Indocin as directed by Dr. Shawnie Pons. Increase fluids and rest. Continue pelvic rest. Headache red flags reviewed.     Follow-up  Information   Follow up with [facility] On 08/13/2013. (As scheduled or as needed if symptoms worsen)    Specialty:  Obstetrics and Gynecology   Contact information:   [address] [place] [address] [phone]      Follow up with [facility] [place] [facility]ERN[facility] AD[facility]. (As needed if symptoms worsen)    Contact information:   [address] [id number] [place] [address] [phone]       Medication List         butalbital-acetaminophen-caffeine 50-325-40 MG per tablet  Commonly known as:  FIORICET  Take 1-2 tablets by mouth every 6 (six) hours as needed for headache. Use sparingly     indomethacin 25 mg Supp  Commonly known as:  INDOCIN  Place 1 suppository (25 mg total) rectally every 6 (six) hours.     progesterone 200 MG capsule  Commonly known as:  PROMETRIUM  Place 1 capsule (200 mg total) vaginally at bedtime.       [place] Reni Hausner, CNM 08/05/2013  3:54 AM

## 2013-08-05 ENCOUNTER — Encounter (HOSPITAL_COMMUNITY): Payer: Self-pay | Admitting: *Deleted

## 2013-08-05 ENCOUNTER — Other Ambulatory Visit: Payer: Self-pay | Admitting: Advanced Practice Midwife

## 2013-08-05 ENCOUNTER — Inpatient Hospital Stay (HOSPITAL_COMMUNITY): Payer: Medicaid Other

## 2013-08-05 ENCOUNTER — Encounter: Payer: Self-pay | Admitting: Advanced Practice Midwife

## 2013-08-05 DIAGNOSIS — O09292 Supervision of pregnancy with other poor reproductive or obstetric history, second trimester: Secondary | ICD-10-CM

## 2013-08-05 DIAGNOSIS — O3430 Maternal care for cervical incompetence, unspecified trimester: Secondary | ICD-10-CM

## 2013-08-05 DIAGNOSIS — O09892 Supervision of other high risk pregnancies, second trimester: Secondary | ICD-10-CM

## 2013-08-05 LAB — URINALYSIS, ROUTINE W REFLEX MICROSCOPIC
Bilirubin Urine: NEGATIVE
Glucose, UA: NEGATIVE mg/dL
Hgb urine dipstick: NEGATIVE
Ketones, ur: NEGATIVE mg/dL
LEUKOCYTES UA: NEGATIVE
Nitrite: NEGATIVE
PROTEIN: NEGATIVE mg/dL
Specific Gravity, Urine: 1.015 (ref 1.005–1.030)
UROBILINOGEN UA: 0.2 mg/dL (ref 0.0–1.0)
pH: 7.5 (ref 5.0–8.0)

## 2013-08-05 LAB — CBC
HCT: 30.8 % — ABNORMAL LOW (ref 36.0–46.0)
Hemoglobin: 11.4 g/dL — ABNORMAL LOW (ref 12.0–15.0)
MCH: 29.3 pg (ref 26.0–34.0)
MCHC: 37 g/dL — ABNORMAL HIGH (ref 30.0–36.0)
MCV: 79.2 fL (ref 78.0–100.0)
Platelets: 234 10*3/uL (ref 150–400)
RBC: 3.89 MIL/uL (ref 3.87–5.11)
RDW: 13.5 % (ref 11.5–15.5)
WBC: 7 10*3/uL (ref 4.0–10.5)

## 2013-08-05 LAB — POCT FERN TEST: POCT Fern Test: NEGATIVE

## 2013-08-05 MED ORDER — PROGESTERONE MICRONIZED 200 MG PO CAPS: 200.0000 mg | ORAL_CAPSULE | Freq: Every day | ORAL | Status: DC

## 2013-08-05 MED ORDER — BUTALBITAL-APAP-CAFFEINE 50-325-40 MG PO TABS: 1.0000 | ORAL_TABLET | Freq: Four times a day (QID) | ORAL | Status: DC | PRN

## 2013-08-05 MED ORDER — IBUPROFEN 600 MG PO TABS
600.0000 mg | ORAL_TABLET | Freq: Once | ORAL | Status: AC
  Administered 2013-08-05: 600 mg via ORAL
  Filled 2013-08-05: qty 1

## 2013-08-05 MED ORDER — INDOMETHACIN 25 MG SUPPOSITORY: 25.0000 mg | Freq: Four times a day (QID) | RECTAL | Status: AC

## 2013-08-05 MED ORDER — BUTALBITAL-APAP-CAFFEINE 50-325-40 MG PO TABS
2.0000 | ORAL_TABLET | Freq: Once | ORAL | Status: AC
  Administered 2013-08-05: 2 via ORAL
  Filled 2013-08-05: qty 2

## 2013-08-05 NOTE — MAU Note (Addendum)
PT SAYS SHE HAD CIRCLAGE IN  YESTERDAY- DR [name]- THINKS SHE WAS DILATED.Marland Kitchen.  SHE STARTED GETTING H/A   AT 2PM-  SHE HAS BEEN TAKING XS   TYLENOL- LAST TAKEN  - DOESN'T;T KNOW.   NO VOMITING. NO FEVER.    DAS SOME BLEEDING- HAS PAD ON - SINCE CIRCLAGE    SAYS HER BACK STARTED HURTING   AT 12 NOON

## 2013-08-05 NOTE — Discharge Instructions (Signed)
Migraine Headache A migraine headache is an intense, throbbing pain on one or both sides of your head. A migraine can last for 30 minutes to several hours. CAUSES  The exact cause of a migraine headache is not always known. However, a migraine may be caused when nerves in the brain become irritated and release chemicals that cause inflammation. This causes pain. Certain things may also trigger migraines, such as:  Alcohol.  Smoking.  Stress.  Menstruation.  Aged cheeses.  Foods or drinks that contain nitrates, glutamate, aspartame, or tyramine.  Lack of sleep.  Chocolate.  Caffeine.  Hunger.  Physical exertion.  Fatigue.  Medicines used to treat chest pain (nitroglycerine), birth control pills, estrogen, and some blood pressure medicines. SIGNS AND SYMPTOMS  Pain on one or both sides of your head.  Pulsating or throbbing pain.  Severe pain that prevents daily activities.  Pain that is aggravated by any physical activity.  Nausea, vomiting, or both.  Dizziness.  Pain with exposure to bright lights, loud noises, or activity.  General sensitivity to bright lights, loud noises, or smells. Before you get a migraine, you may get warning signs that a migraine is coming (aura). An aura may include:  Seeing flashing lights.  Seeing bright spots, halos, or zig-zag lines.  Having tunnel vision or blurred vision.  Having feelings of numbness or tingling.  Having trouble talking.  Having muscle weakness. DIAGNOSIS  A migraine headache is often diagnosed based on:  Symptoms.  Physical exam.  A CT scan or MRI of your head. These imaging tests cannot diagnose migraines, but they can help rule out other causes of headaches. TREATMENT Medicines may be given for pain and nausea. Medicines can also be given to help prevent recurrent migraines.  HOME CARE INSTRUCTIONS  Only take over-the-counter or prescription medicines for pain or discomfort as directed by your  health care provider. The use of long-term narcotics is not recommended.  Lie down in a dark, quiet room when you have a migraine.  Keep a journal to find out what may trigger your migraine headaches. For example, write down:  What you eat and drink.  How much sleep you get.  Any change to your diet or medicines.  Limit alcohol consumption.  Quit smoking if you smoke.  Get 7 9 hours of sleep, or as recommended by your health care provider.  Limit stress.  Keep lights dim if bright lights bother you and make your migraines worse. SEEK IMMEDIATE MEDICAL CARE IF:   Your migraine becomes severe.  You have a fever.  You have a stiff neck.  You have vision loss.  You have muscular weakness or loss of muscle control.  You start losing your balance or have trouble walking.  You feel faint or pass out.  You have severe symptoms that are different from your first symptoms. MAKE SURE YOU:   Understand these instructions.  Will watch your condition.  Will get help right away if you are not doing well or get worse. Document Released: 07/02/2005 Document Revised: 04/22/2013 Document Reviewed: 03/09/2013 [facility] Patient Information 2014 [place] [place].  Back Pain in Pregnancy Back pain during pregnancy is common. It happens in about half of all pregnancies. It is important for you and your baby that you remain active during your pregnancy.If you feel that back pain is not allowing you to remain active or sleep well, it is time to see your caregiver. Back pain may be caused by several factors related to changes during  your pregnancy.Fortunately, unless you had trouble with your back before your pregnancy, the pain is likely to get better after you deliver. Low back pain usually occurs between the fifth and seventh months of pregnancy. It can, however, happen in the first couple months. Factors that increase the risk of back problems include:   Previous back problems.  Injury  to your back.  Having twins or multiple births.  A chronic cough.  Stress.  Job-related repetitive motions.  Muscle or spinal disease in the back.  Family history of back problems, ruptured (herniated) discs, or osteoporosis.  Depression, anxiety, and panic attacks. CAUSES   When you are pregnant, your body produces a hormone called relaxin. This hormonemakes the ligaments connecting the low back and pubic bones more flexible. This flexibility allows the baby to be delivered more easily. When your ligaments are loose, your muscles need to work harder to support your back. Soreness in your back can come from tired muscles. Soreness can also come from back tissues that are irritated since they are receiving less support.  As the baby grows, it puts pressure on the nerves and blood vessels in your pelvis. This can cause back pain.  As the baby grows and gets heavier during pregnancy, the uterus pushes the stomach muscles forward and changes your center of gravity. This makes your back muscles work harder to maintain good posture. SYMPTOMS  Lumbar pain during pregnancy Lumbar pain during pregnancy usually occurs at or above the waist in the center of the back. There may be pain and numbness that radiates into your leg or foot. This is similar to low back pain experienced by non-pregnant women. It usually increases with sitting for long periods of time, standing, or repetitive lifting. Tenderness may also be present in the muscles along your upper back. Posterior pelvic pain during pregnancy Pain in the back of the pelvis is more common than lumbar pain in pregnancy. It is a deep pain felt in your side at the waistline, or across the tailbone (sacrum), or in both places. You may have pain on one or both sides. This pain can also go into the buttocks and backs of the upper thighs. Pubic and groin pain may also be present. The pain does not quickly resolve with rest, and morning stiffness may  also be present. Pelvic pain during pregnancy can be brought on by most activities. A high level of fitness before and during pregnancy may or may not prevent this problem. Labor pain is usually 1 to 2 minutes apart, lasts for about 1 minute, and involves a bearing down feeling or pressure in your pelvis. However, if you are at term with the pregnancy, constant low back pain can be the beginning of early labor, and you should be aware of this. DIAGNOSIS  X-rays of the back should not be done during the first 12 to 14 weeks of the pregnancy and only when absolutely necessary during the rest of the pregnancy. MRIs do not give off radiation and are safe during pregnancy. MRIs also should only be done when absolutely necessary. HOME CARE INSTRUCTIONS  Exercise as directed by your caregiver. Exercise is the most effective way to prevent or manage back pain. If you have a back problem, it is especially important to avoid sports that require sudden body movements. Swimming and walking are great activities.  Do not stand in one place for long periods of time.  Do not wear high heels.  Sit in chairs with good posture.  Use a pillow on your lower back if necessary. Make sure your head rests over your shoulders and is not hanging forward.  Try sleeping on your side, preferably the left side, with a pillow or two between your legs. If you are sore after a night's rest, your bedmay betoo soft.Try placing a board between your mattress and box spring.  Listen to your body when lifting.If you are experiencing pain, ask for help or try bending yourknees more so you can use your leg muscles rather than your back muscles. Squat down when picking up something from the floor. Do not bend over.  Eat a healthy diet. Try to gain weight within your caregiver's recommendations.  Use heat or cold packs 3 to 4 times a day for 15 minutes to help with the pain.  Only take over-the-counter or prescription medicines for  pain, discomfort, or fever as directed by your caregiver. Sudden (acute) back pain  Use bed rest for only the most extreme, acute episodes of back pain. Prolonged bed rest over 48 hours will aggravate your condition.  Ice is very effective for acute conditions.  Put ice in a plastic bag.  Place a towel between your skin and the bag.  Leave the ice on for 10 to 20 minutes every 2 hours, or as needed.  Using heat packs for 30 minutes prior to activities is also helpful. Continued back pain See your caregiver if you have continued problems. Your caregiver can help or refer you for appropriate physical therapy. With conditioning, most back problems can be avoided. Sometimes, a more serious issue may be the cause of back pain. You should be seen right away if new problems seem to be developing. Your caregiver may recommend:  A maternity girdle.  An elastic sling.  A back brace.  A massage therapist or acupuncture. SEEK MEDICAL CARE IF:   You are not able to do most of your daily activities, even when taking the pain medicine you were given.  You need a referral to a physical therapist or chiropractor.  You want to try acupuncture. SEEK IMMEDIATE MEDICAL CARE IF:  You develop numbness, tingling, weakness, or problems with the use of your arms or legs.  You develop severe back pain that is no longer relieved with medicines.  You have a sudden change in bowel or bladder control.  You have increasing pain in other areas of the body.  You develop shortness of breath, dizziness, or fainting.  You develop nausea, vomiting, or sweating.  You have back pain which is similar to labor pains.  You have back pain along with your water breaking or vaginal bleeding.  You have back pain or numbness that travels down your leg.  Your back pain developed after you fell.  You develop pain on one side of your back. You may have a kidney stone.  You see blood in your urine. You may have a  bladder infection or kidney stone.  You have back pain with blisters. You may have shingles. Back pain is fairly common during pregnancy but should not be accepted as just part of the process. Back pain should always be treated as soon as possible. This will make your pregnancy as pleasant as possible. Document Released: 10/10/2005 Document Revised: 09/24/2011 Document Reviewed: 11/21/2010 [facility] Patient Information 2014 [place], [place].

## 2013-08-06 ENCOUNTER — Encounter: Payer: Medicaid Other | Admitting: Family Medicine

## 2013-08-06 NOTE — MAU Provider Note (Signed)
Attestation of Attending Supervision of Advanced Practitioner (CNM/NP): Evaluation and management procedures were performed by the Advanced Practitioner under my supervision and collaboration.  I have reviewed the Advanced Practitioner's note and chart, and I agree with the management and plan.  [name], [name] 1:07 PM

## 2013-08-13 ENCOUNTER — Ambulatory Visit (INDEPENDENT_AMBULATORY_CARE_PROVIDER_SITE_OTHER): Payer: Medicaid Other | Admitting: Family

## 2013-08-13 VITALS — BP 101/64 | Temp 97.7°F | Wt 120.9 lb

## 2013-08-13 DIAGNOSIS — O3430 Maternal care for cervical incompetence, unspecified trimester: Secondary | ICD-10-CM

## 2013-08-13 DIAGNOSIS — O0991 Supervision of high risk pregnancy, unspecified, first trimester: Secondary | ICD-10-CM

## 2013-08-13 LAB — POCT URINALYSIS DIP (DEVICE)
BILIRUBIN URINE: NEGATIVE
GLUCOSE, UA: NEGATIVE mg/dL
Hgb urine dipstick: NEGATIVE
Ketones, ur: NEGATIVE mg/dL
NITRITE: NEGATIVE
PH: 7 (ref 5.0–8.0)
Protein, ur: 30 mg/dL — AB
Specific Gravity, Urine: 1.025 (ref 1.005–1.030)
Urobilinogen, UA: 0.2 mg/dL (ref 0.0–1.0)

## 2013-08-13 MED ORDER — PROGESTERONE MICRONIZED 200 MG PO CAPS: 200.0000 mg | ORAL_CAPSULE | Freq: Every day | ORAL | Status: DC

## 2013-08-13 NOTE — Patient Instructions (Signed)
For assistance with applying for medicaid please call [facility] [phone] or Juanita McCraw [phone].  

## 2013-08-13 NOTE — Progress Notes (Signed)
Reports doing well after cerclage.  No questions or concerns.  Referred to [place] or Juanita with assistance with Medicaid.  Anatomy ultrasound scheduled on 08/20/13.

## 2013-08-13 NOTE — Progress Notes (Signed)
P= 89 Pt. States she was only able to get the Fioricet and unable to get progesterone it was too expensive-- [facility]. States she has been having difficulty with Medicaid and has gone to the office a couple of times but the wait has been too long.

## 2013-08-20 ENCOUNTER — Ambulatory Visit (HOSPITAL_COMMUNITY)
Admission: RE | Admit: 2013-08-20 | Discharge: 2013-08-20 | Disposition: A | Discharge status: Home / Self Care | Payer: Medicaid Other | Source: Ambulatory Visit | Attending: Family Medicine | Admitting: Family Medicine

## 2013-08-20 ENCOUNTER — Other Ambulatory Visit: Payer: Self-pay | Admitting: Family Medicine

## 2013-08-20 DIAGNOSIS — O09299 Supervision of pregnancy with other poor reproductive or obstetric history, unspecified trimester: Secondary | ICD-10-CM | POA: Insufficient documentation

## 2013-08-20 DIAGNOSIS — O3430 Maternal care for cervical incompetence, unspecified trimester: Secondary | ICD-10-CM

## 2013-08-20 DIAGNOSIS — Z1389 Encounter for screening for other disorder: Secondary | ICD-10-CM | POA: Insufficient documentation

## 2013-08-20 DIAGNOSIS — O358XX0 Maternal care for other (suspected) fetal abnormality and damage, not applicable or unspecified: Secondary | ICD-10-CM | POA: Insufficient documentation

## 2013-08-20 DIAGNOSIS — Z363 Encounter for antenatal screening for malformations: Secondary | ICD-10-CM | POA: Insufficient documentation

## 2013-08-27 ENCOUNTER — Encounter: Payer: Self-pay | Admitting: Obstetrics and Gynecology

## 2013-08-27 ENCOUNTER — Encounter: Payer: Medicaid Other | Admitting: Obstetrics and Gynecology

## 2013-09-02 ENCOUNTER — Encounter: Payer: Self-pay | Admitting: *Deleted

## 2013-09-04 ENCOUNTER — Encounter: Payer: Self-pay | Admitting: *Deleted

## 2013-10-21 ENCOUNTER — Encounter (HOSPITAL_COMMUNITY): Payer: Self-pay

## 2013-10-21 ENCOUNTER — Inpatient Hospital Stay (HOSPITAL_COMMUNITY)
Admission: AD | Admit: 2013-10-21 | Discharge: 2013-10-21 | Disposition: A | Discharge status: Home / Self Care | Payer: Medicaid Other | Source: Ambulatory Visit | Attending: Obstetrics & Gynecology | Admitting: Obstetrics & Gynecology

## 2013-10-21 DIAGNOSIS — R109 Unspecified abdominal pain: Secondary | ICD-10-CM | POA: Insufficient documentation

## 2013-10-21 DIAGNOSIS — O99330 Smoking (tobacco) complicating pregnancy, unspecified trimester: Secondary | ICD-10-CM

## 2013-10-21 DIAGNOSIS — O4690 Antepartum hemorrhage, unspecified, unspecified trimester: Secondary | ICD-10-CM

## 2013-10-21 LAB — URINALYSIS, ROUTINE W REFLEX MICROSCOPIC
BILIRUBIN URINE: NEGATIVE
Glucose, UA: NEGATIVE mg/dL
Hgb urine dipstick: NEGATIVE
KETONES UR: 15 mg/dL — AB
LEUKOCYTES UA: NEGATIVE
NITRITE: NEGATIVE
PH: 6.5 (ref 5.0–8.0)
Protein, ur: NEGATIVE mg/dL
Specific Gravity, Urine: 1.025 (ref 1.005–1.030)
UROBILINOGEN UA: 1 mg/dL (ref 0.0–1.0)

## 2013-10-21 LAB — WET PREP, GENITAL
CLUE CELLS WET PREP: NONE SEEN
Trich, Wet Prep: NONE SEEN
Yeast Wet Prep HPF POC: NONE SEEN

## 2013-10-21 NOTE — Discharge Instructions (Signed)
Your work up was negative.  There was not yeast or BV noted on your wet prep. Your bleeding appears to be secondary to intercourse.  No bleeding was noted on your exam. [facility]  Please call ASAP and schedule a follow up visit. Refrain from intercourse for the next 48-72 hours.  If you have any additional bleeding or concerns please call the women's clinic or return to the MAU.

## 2013-10-21 NOTE — MAU Provider Note (Signed)
History     CSN: [id number]  Arrival date and time: 10/21/13 2054   First Provider Initiated Contact with Patient 10/21/13 2149      Chief Complaint  Patient presents with  . Vaginal Bleeding  . Abdominal Pain  . Vaginal Discharge   HPI 18 year old female G3P1011 at [redacted]w[redacted]d presents with complaints of vaginal discharge and vaginal bleeding.  Patient reports that she has malodorous discharge for the past 1-2 months.  No associated itching or other symptoms.   Patient also reports vaginal bleeding that began yesterday.  She reports that she has had bright red blood and now is having brownish discharge. This was preceded by intercourse yesterday.    She has no other complaints at this time.  No LOF.  Good fetal movement.  Prenatal care: [facility].  Pregnancy complicated by hx of cervical incompence (18 week loss). Cerclage placed on 08/03/2013.  Patient also has not been following up regularly due to insurance/payment. Last visit was at [redacted]w[redacted]d.  Past Medical History  Diagnosis Date  . Medical history non-contributory   . Cervical incompetence with baby delivered in second trimester 12/04/2012    Needs cerclage and close follow up with subsequent pregnancies     Past Surgical History  Procedure Laterality Date  . No past surgeries    . Dilation and evacuation N/A 12/04/2012    Procedure: DILATATION AND EVACUATION;  Surgeon: Tereso Newcomer, MD;  Location: [facility];  Service: Gynecology;  Laterality: N/A;  . Cervical cerclage N/A 08/03/2013    Procedure: CERCLAGE CERVICAL;  Surgeon: Reva Bores, MD;  Location: [facility];  Service: Gynecology;  Laterality: N/A;    History reviewed. No pertinent family history.  History  Substance Use Topics  . Smoking status: Current Some Day Smoker -- 0.25 packs/day    Types: Cigarettes  . Smokeless tobacco: Never Used  . Alcohol Use: Yes     Comment: ocassionally    Allergies: No Known Allergies  Prescriptions prior to admission  Medication Sig  Dispense Refill  . butalbital-acetaminophen-caffeine (FIORICET) 50-325-40 MG per tablet Take 1-2 tablets by mouth every 6 (six) hours as needed for headache. Use sparingly  20 tablet  1    ROS Per HPI Physical Exam   Blood pressure 103/64, pulse 102, resp. rate 18, height 5\' 2"  (1.575 m), weight 63.413 kg (139 lb 12.8 oz), last menstrual period 03/24/2013.  Physical Exam Gen: well appearing female in NAD. Heart: RRR. No m/r/g. Lungs: CTAB. No rales, rhonchi, or wheezing. Abd: gravid but otherwise soft, nontender to palpation Ext: no appreciable lower extremity edema bilaterally Pelvic Exam:        External: normal female genitalia without lesions or masses        Vagina: white/yellow discharge noted.        Cervix: cerclage intact. Cervix closed.        Samples for Wet prep, GC/Chlamydia obtained Cervical Exam: Dilation: Closed Effacement (%): Thick Cervical Position: Posterior Exam by:: Dr Adriana Simas  FHR: baseline 130, mod variability, 15x15 accels, no decels Toco: No uterine activity  Cervix above os on 2/9 [place]  MAU Course  Procedures  MDM Wet prep GC/Chlamydia  Assessment and Plan  18 year old female G3P1011 at [redacted]w[redacted]d with hx of cervical incompetence (cerclage in place) presents with complaints of vaginal discharge and post-coital vaginal bleeding. - Cervix closed and no contractions noted on Toco - No bleeding appreciated on exam.  Bleeding likely just secondary to recent intercourse. Recommended pelvic  rest with cerclage in place and now a bleed. - Wet prep obtained.  It revealed moderate WBC's. No yeast or clue cells. - Patient stable for discharge home.  Case discussed with Dr. Lorenda Cahilldom OB fellow.  [facility]  Tommie SamsJayce G Cook 10/21/2013, 9:59 PM   I spoke with and examined patient and agree with resident's note and plan of care.  Tawana ScaleMichael Ryan Fredrik Mogel, MD OB Fellow 10/22/2013 9:17 AM

## 2013-10-21 NOTE — MAU Note (Signed)
Pt G3 P1 at 29.2wks, had a yellowish discharge with an odor x 2 months, yesterday had bright red bleeding on tissue after using the bathroom.  Today having a brownish discharge. Having mid abdominal pain that she has had on and off for a while.

## 2013-10-22 ENCOUNTER — Telehealth: Payer: Self-pay

## 2013-10-22 NOTE — Telephone Encounter (Signed)
Message copied by Louanna RawAMPBELL, Wilmore Holsomback M on Thu Oct 22, 2013 12:05 PM ------      Message from: Jolyn LentDOM, MICHAEL R      Created: Wed Oct 21, 2013 11:00 PM       Pt in need of social worker to help with medicare. Pt is getting bills and missing appointments because of bills. Please help coordinate. Pt with cerclage and needs to be seen. Thank you.            Tawana ScaleMichael Ryan Odom, MD      OB Fellow       ------

## 2013-10-22 NOTE — Telephone Encounter (Signed)
Levonne Lappingalled Colleen, social worker, [phone] and gave her pt.'s information and phone number to coordinate help. Jill SideColleen will attempt to get in touch with patient.

## 2013-10-23 LAB — GC/CHLAMYDIA PROBE AMP
CT Probe RNA: NEGATIVE
GC Probe RNA: NEGATIVE

## 2013-10-29 ENCOUNTER — Ambulatory Visit (INDEPENDENT_AMBULATORY_CARE_PROVIDER_SITE_OTHER): Payer: Medicaid Other | Admitting: Obstetrics & Gynecology

## 2013-10-29 VITALS — BP 108/64 | Temp 98.0°F | Wt 140.9 lb

## 2013-10-29 DIAGNOSIS — O3430 Maternal care for cervical incompetence, unspecified trimester: Secondary | ICD-10-CM

## 2013-10-29 DIAGNOSIS — O0991 Supervision of high risk pregnancy, unspecified, first trimester: Secondary | ICD-10-CM

## 2013-10-29 DIAGNOSIS — Z23 Encounter for immunization: Secondary | ICD-10-CM

## 2013-10-29 DIAGNOSIS — B009 Herpesviral infection, unspecified: Secondary | ICD-10-CM

## 2013-10-29 DIAGNOSIS — O09299 Supervision of pregnancy with other poor reproductive or obstetric history, unspecified trimester: Secondary | ICD-10-CM | POA: Insufficient documentation

## 2013-10-29 DIAGNOSIS — O0990 Supervision of high risk pregnancy, unspecified, unspecified trimester: Secondary | ICD-10-CM

## 2013-10-29 DIAGNOSIS — B001 Herpesviral vesicular dermatitis: Secondary | ICD-10-CM

## 2013-10-29 LAB — POCT URINALYSIS DIP (DEVICE)
Bilirubin Urine: NEGATIVE
GLUCOSE, UA: NEGATIVE mg/dL
Hgb urine dipstick: NEGATIVE
KETONES UR: NEGATIVE mg/dL
Nitrite: NEGATIVE
PROTEIN: NEGATIVE mg/dL
Specific Gravity, Urine: 1.025 (ref 1.005–1.030)
Urobilinogen, UA: 0.2 mg/dL (ref 0.0–1.0)
pH: 7 (ref 5.0–8.0)

## 2013-10-29 LAB — CBC
HEMATOCRIT: 32.5 % — AB (ref 36.0–46.0)
Hemoglobin: 11.6 g/dL — ABNORMAL LOW (ref 12.0–15.0)
MCH: 29.2 pg (ref 26.0–34.0)
MCHC: 35.7 g/dL (ref 30.0–36.0)
MCV: 81.9 fL (ref 78.0–100.0)
Platelets: 252 10*3/uL (ref 150–400)
RBC: 3.97 MIL/uL (ref 3.87–5.11)
RDW: 13.7 % (ref 11.5–15.5)
WBC: 7.4 10*3/uL (ref 4.0–10.5)

## 2013-10-29 LAB — GLUCOSE TOLERANCE, 1 HOUR (50G) W/O FASTING: GLUCOSE 1 HOUR GTT: 115 mg/dL (ref 70–140)

## 2013-10-29 LAB — HIV ANTIBODY (ROUTINE TESTING W REFLEX): HIV 1&2 Ab, 4th Generation: NONREACTIVE

## 2013-10-29 LAB — RPR

## 2013-10-29 MED ORDER — TETANUS-DIPHTH-ACELL PERTUSSIS 5-2.5-18.5 LF-MCG/0.5 IM SUSP: 0.5000 mL | Freq: Once | INTRAMUSCULAR | Status: DC

## 2013-10-29 MED ORDER — VALACYCLOVIR HCL 1 G PO TABS: 1000.0000 mg | ORAL_TABLET | Freq: Every day | ORAL | Status: DC

## 2013-10-29 MED ORDER — PANTOPRAZOLE SODIUM 40 MG PO TBEC: 40.0000 mg | DELAYED_RELEASE_TABLET | Freq: Every day | ORAL | Status: DC

## 2013-10-29 NOTE — Progress Notes (Signed)
Encouraged compliance with f/u, not seen since Jan, will schedule US for growth, h/o IUGR first pregnancy. Needs tx for heartburn, rx Protonix

## 2013-10-29 NOTE — Patient Instructions (Addendum)
Third Trimester of Pregnancy The third trimester is from week 29 through week 42, months 7 through 9. The third trimester is a time when the fetus is growing rapidly. At the end of the ninth month, the fetus is about 20 inches in length and weighs 6 10 pounds.  BODY CHANGES Your body goes through many changes during pregnancy. The changes vary from woman to woman.   Your weight will continue to increase. You can expect to gain 25 35 pounds (11 16 kg) by the end of the pregnancy.  You may begin to get stretch marks on your hips, abdomen, and breasts.  You may urinate more often because the fetus is moving lower into your pelvis and pressing on your bladder.  You may develop or continue to have heartburn as a result of your pregnancy.  You may develop constipation because certain hormones are causing the muscles that push waste through your intestines to slow down.  You may develop hemorrhoids or swollen, bulging veins (varicose veins).  You may have pelvic pain because of the weight gain and pregnancy hormones relaxing your joints between the bones in your pelvis. Back aches may result from over exertion of the muscles supporting your posture.  Your breasts will continue to grow and be tender. A yellow discharge may leak from your breasts called colostrum.  Your belly button may stick out.  You may feel short of breath because of your expanding uterus.  You may notice the fetus "dropping," or moving lower in your abdomen.  You may have a bloody mucus discharge. This usually occurs a few days to a week before labor begins.  Your cervix becomes thin and soft (effaced) near your due date. WHAT TO EXPECT AT YOUR PRENATAL EXAMS  You will have prenatal exams every 2 weeks until week 36. Then, you will have weekly prenatal exams. During a routine prenatal visit:  You will be weighed to make sure you and the fetus are growing normally.  Your blood pressure is taken.  Your abdomen will be  measured to track your baby's growth.  The fetal heartbeat will be listened to.  Any test results from the previous visit will be discussed.  You may have a cervical check near your due date to see if you have effaced. At around 36 weeks, your caregiver will check your cervix. At the same time, your caregiver will also perform a test on the secretions of the vaginal tissue. This test is to determine if a type of bacteria, Group B streptococcus, is present. Your caregiver will explain this further. Your caregiver may ask you:  What your birth plan is.  How you are feeling.  If you are feeling the baby move.  If you have had any abnormal symptoms, such as leaking fluid, bleeding, severe headaches, or abdominal cramping.  If you have any questions. Other tests or screenings that may be performed during your third trimester include:  Blood tests that check for low iron levels (anemia).  Fetal testing to check the health, activity level, and growth of the fetus. Testing is done if you have certain medical conditions or if there are problems during the pregnancy. FALSE LABOR You may feel small, irregular contractions that eventually go away. These are called Braxton Hicks contractions, or false labor. Contractions may last for hours, days, or even weeks before true labor sets in. If contractions come at regular intervals, intensify, or become painful, it is best to be seen by your caregiver.  SIGNS OF LABOR   Menstrual-like cramps.  Contractions that are 5 minutes apart or less.  Contractions that start on the top of the uterus and spread down to the lower abdomen and back.  A sense of increased pelvic pressure or back pain.  A watery or bloody mucus discharge that comes from the vagina. If you have any of these signs before the 37th week of pregnancy, call your caregiver right away. You need to go to the hospital to get checked immediately. HOME CARE INSTRUCTIONS   Avoid all  smoking, herbs, alcohol, and unprescribed drugs. These chemicals affect the formation and growth of the baby.  Follow your caregiver's instructions regarding medicine use. There are medicines that are either safe or unsafe to take during pregnancy.  Exercise only as directed by your caregiver. Experiencing uterine cramps is a good sign to stop exercising.  Continue to eat regular, healthy meals.  Wear a good support bra for breast tenderness.  Do not use hot tubs, steam rooms, or saunas.  Wear your seat belt at all times when driving.  Avoid raw meat, uncooked cheese, cat litter boxes, and soil used by cats. These carry germs that can cause birth defects in the baby.  Take your prenatal vitamins.  Try taking a stool softener (if your caregiver approves) if you develop constipation. Eat more high-fiber foods, such as fresh vegetables or fruit and whole grains. Drink plenty of fluids to keep your urine clear or pale yellow.  Take warm sitz baths to soothe any pain or discomfort caused by hemorrhoids. Use hemorrhoid cream if your caregiver approves.  If you develop varicose veins, wear support hose. Elevate your feet for 15 minutes, 3 4 times a day. Limit salt in your diet.  Avoid heavy lifting, wear low heal shoes, and practice good posture.  Rest a lot with your legs elevated if you have leg cramps or low back pain.  Visit your dentist if you have not gone during your pregnancy. Use a soft toothbrush to brush your teeth and be gentle when you floss.  A sexual relationship may be continued unless your caregiver directs you otherwise.  Do not travel far distances unless it is absolutely necessary and only with the approval of your caregiver.  Take prenatal classes to understand, practice, and ask questions about the labor and delivery.  Make a trial run to the hospital.  Pack your hospital bag.  Prepare the baby's nursery.  Continue to go to all your prenatal visits as directed  by your caregiver. SEEK MEDICAL CARE IF:  You are unsure if you are in labor or if your water has broken.  You have dizziness.  You have mild pelvic cramps, pelvic pressure, or nagging pain in your abdominal area.  You have persistent nausea, vomiting, or diarrhea.  You have a bad smelling vaginal discharge.  You have pain with urination. SEEK IMMEDIATE MEDICAL CARE IF:   You have a fever.  You are leaking fluid from your vagina.  You have spotting or bleeding from your vagina.  You have severe abdominal cramping or pain.  You have rapid weight loss or gain.  You have shortness of breath with chest pain.  You notice sudden or extreme swelling of your face, hands, ankles, feet, or legs.  You have not felt your baby move in over an hour.  You have severe headaches that do not go away with medicine.  You have vision changes. Document Released: 06/26/2001 Document Revised: 03/04/2013 Document Reviewed:   09/02/2012 [employer] Patient Information 2014 [employer] [place]. Back Pain in Pregnancy Back pain during pregnancy is common. It happens in about half of all pregnancies. It is important for you and your baby that you remain active during your pregnancy.If you feel that back pain is not allowing you to remain active or sleep well, it is time to see your caregiver. Back pain may be caused by several factors related to changes during your pregnancy.Fortunately, unless you had trouble with your back before your pregnancy, the pain is likely to get better after you deliver. Low back pain usually occurs between the fifth and seventh months of pregnancy. It can, however, happen in the first couple months. Factors that increase the risk of back problems include:   Previous back problems.  Injury to your back.  Having twins or multiple births.  A chronic cough.  Stress.  Job-related repetitive motions.  Muscle or spinal disease in the back.  Family history of back problems,  ruptured (herniated) discs, or osteoporosis.  Depression, anxiety, and panic attacks. CAUSES   When you are pregnant, your body produces a hormone called relaxin. This hormonemakes the ligaments connecting the low back and pubic bones more flexible. This flexibility allows the baby to be delivered more easily. When your ligaments are loose, your muscles need to work harder to support your back. Soreness in your back can come from tired muscles. Soreness can also come from back tissues that are irritated since they are receiving less support.  As the baby grows, it puts pressure on the nerves and blood vessels in your pelvis. This can cause back pain.  As the baby grows and gets heavier during pregnancy, the uterus pushes the stomach muscles forward and changes your center of gravity. This makes your back muscles work harder to maintain good posture. SYMPTOMS  Lumbar pain during pregnancy Lumbar pain during pregnancy usually occurs at or above the waist in the center of the back. There may be pain and numbness that radiates into your leg or foot. This is similar to low back pain experienced by non-pregnant women. It usually increases with sitting for long periods of time, standing, or repetitive lifting. Tenderness may also be present in the muscles along your upper back. Posterior pelvic pain during pregnancy Pain in the back of the pelvis is more common than lumbar pain in pregnancy. It is a deep pain felt in your side at the waistline, or across the tailbone (sacrum), or in both places. You may have pain on one or both sides. This pain can also go into the buttocks and backs of the upper thighs. Pubic and groin pain may also be present. The pain does not quickly resolve with rest, and morning stiffness may also be present. Pelvic pain during pregnancy can be brought on by most activities. A high level of fitness before and during pregnancy may or may not prevent this problem. Labor pain is usually  1 to 2 minutes apart, lasts for about 1 minute, and involves a bearing down feeling or pressure in your pelvis. However, if you are at term with the pregnancy, constant low back pain can be the beginning of early labor, and you should be aware of this. DIAGNOSIS  X-rays of the back should not be done during the first 12 to 14 weeks of the pregnancy and only when absolutely necessary during the rest of the pregnancy. MRIs do not give off radiation and are safe during pregnancy. MRIs also should only be done  when absolutely necessary. HOME CARE INSTRUCTIONS  Exercise as directed by your caregiver. Exercise is the most effective way to prevent or manage back pain. If you have a back problem, it is especially important to avoid sports that require sudden body movements. Swimming and walking are great activities.  Do not stand in one place for long periods of time.  Do not wear high heels.  Sit in chairs with good posture. Use a pillow on your lower back if necessary. Make sure your head rests over your shoulders and is not hanging forward.  Try sleeping on your side, preferably the left side, with a pillow or two between your legs. If you are sore after a night's rest, your bedmay betoo soft.Try placing a board between your mattress and box spring.  Listen to your body when lifting.If you are experiencing pain, ask for help or try bending yourknees more so you can use your leg muscles rather than your back muscles. Squat down when picking up something from the floor. Do not bend over.  Eat a healthy diet. Try to gain weight within your caregiver's recommendations.  Use heat or cold packs 3 to 4 times a day for 15 minutes to help with the pain.  Only take over-the-counter or prescription medicines for pain, discomfort, or fever as directed by your caregiver. Sudden (acute) back pain  Use bed rest for only the most extreme, acute episodes of back pain. Prolonged bed rest over 48 hours will  aggravate your condition.  Ice is very effective for acute conditions.  Put ice in a plastic bag.  Place a towel between your skin and the bag.  Leave the ice on for 10 to 20 minutes every 2 hours, or as needed.  Using heat packs for 30 minutes prior to activities is also helpful. Continued back pain See your caregiver if you have continued problems. Your caregiver can help or refer you for appropriate physical therapy. With conditioning, most back problems can be avoided. Sometimes, a more serious issue may be the cause of back pain. You should be seen right away if new problems seem to be developing. Your caregiver may recommend:  A maternity girdle.  An elastic sling.  A back brace.  A massage therapist or acupuncture. SEEK MEDICAL CARE IF:   You are not able to do most of your daily activities, even when taking the pain medicine you were given.  You need a referral to a physical therapist or chiropractor.  You want to try acupuncture. SEEK IMMEDIATE MEDICAL CARE IF:  You develop numbness, tingling, weakness, or problems with the use of your arms or legs.  You develop severe back pain that is no longer relieved with medicines.  You have a sudden change in bowel or bladder control.  You have increasing pain in other areas of the body.  You develop shortness of breath, dizziness, or fainting.  You develop nausea, vomiting, or sweating.  You have back pain which is similar to labor pains.  You have back pain along with your water breaking or vaginal bleeding.  You have back pain or numbness that travels down your leg.  Your back pain developed after you fell.  You develop pain on one side of your back. You may have a kidney stone.  You see blood in your urine. You may have a bladder infection or kidney stone.  You have back pain with blisters. You may have shingles. Back pain is fairly common during pregnancy but should  not be accepted as just part of the  process. Back pain should always be treated as soon as possible. This will make your pregnancy as pleasant as possible. Document Released: 10/10/2005 Document Revised: 09/24/2011 Document Reviewed: 11/21/2010 [facility] Patient Information 2014 [place], [place]. Cold Sore A cold sore (fever blister) is a skin infection caused by the herpes simplex virus (HSV-1). HSV-1 is closely related to the virus that causes gential herpes (HSV-2), but they are not the same even though both viruses can cause oral and genital infections. Cold sores are small, fluid-filled sores inside of the mouth or on the lips, gums, nose, chin, cheeks, or fingers.  The herpes simplex virus can be easily passed (contagious) to other people through close personal contact, such as kissing or sharing personal items. The virus can also spread to other parts of the body, such as the eyes or genitals. Cold sores are contagious until the sores crust over completely. They often heal within 2 weeks.  Once a person is infected, the herpes simplex virus remains permanently in the body. Therefore, there is no cure for cold sores, and they often recur when a person is tired, stressed, sick, or gets too much sun. Additional factors that can cause a recurrence include hormone changes in menstruation or pregnancy, certain drugs, and cold weather.  CAUSES  Cold sores are caused by the herpes simplex virus. The virus is spread from person to person through close contact, such as through kissing, touching the affected area, or sharing personal items such as lip balm, razors, or eating utensils.  SYMPTOMS  The first infection may not cause symptoms. If symptoms develop, the symptoms often go through different stages. Here is how a cold sore develops:   Tingling, itching, or burning is felt 1 2 days before the outbreak.   Fluid-filled blisters appear on the lips, inside the mouth, nose, or on the cheeks.   The blisters start to ooze clear fluid.    The blisters dry up and a yellow crust appears in its place.   The crust falls off.  Symptoms depend on whether it is the initial outbreak or a recurrence. Some other symptoms with the first outbreak may include:   Fever.   Sore throat.   Headache.   Muscle aches.   Swollen neck glands.  DIAGNOSIS  A diagnosis is often made based on your symptoms and looking at the sores. Sometimes, a sore may be swabbed and then examined in the lab to make a final diagnosis. If the sores are not present, blood tests can find the herpes simplex virus.  TREATMENT  There is no cure for cold sores and no vaccine for the herpes simplex virus. Within 2 weeks, most cold sores go away on their own without treatment. Medicines cannot make the infection go away, but medicine can help relieve some of the pain associated with the sores, can work to stop the virus from multiplying, and can also shorten healing time. Medicine may be in the form of creams, gels, pills, or a shot.  HOME CARE INSTRUCTIONS   Only take over-the-counter or prescription medicines for pain, discomfort, or fever as directed by your caregiver. Do not use aspirin.   Use a cotton-tip swab to apply creams or gels to your sores.   Do not touch the sores or pick the scabs. Wash your hands often. Do not touch your eyes without washing your hands first.   Avoid kissing, oral sex, and sharing personal items until sores heal.  Apply an ice pack on your sores for 10 15 minutes to ease any discomfort.   Avoid hot, cold, or salty foods because they may hurt your mouth. Eat a soft, bland diet to avoid irritating the sores. Use a straw to drink if you have pain when drinking out of a glass.   Keep sores clean and dry to prevent an infection of other tissues.   Avoid the sun and limit stress if these things trigger outbreaks. If sun causes cold sores, apply sunscreen on the lips before being out in the sun.  SEEK MEDICAL CARE IF:    You have a fever or persistent symptoms for more than 2 3 days.   You have a fever and your symptoms suddenly get worse.   You have pus, not clear fluid, coming from the sores.   You have redness that is spreading.   You have pain or irritation in your eye.   You get sores on your genitals.   Your sores do not heal within 2 weeks.   You have a weakened immune system.   You have frequent recurrences of cold sores.  MAKE SURE YOU:   Understand these instructions.  Will watch your condition.  Will get help right away if you are not doing well or get worse. Document Released: 06/29/2000 Document Revised: 03/26/2012 Document Reviewed: 11/14/2011 [facility] Patient Information 2014 [place].

## 2013-10-29 NOTE — Progress Notes (Signed)
Pulse: 95 Pt went to [facility] for bleeding on 10/21/13. 1hr due at 1110. Patient would like tdap.

## 2013-11-05 ENCOUNTER — Ambulatory Visit (HOSPITAL_COMMUNITY)
Admission: RE | Admit: 2013-11-05 | Discharge: 2013-11-05 | Disposition: A | Discharge status: Home / Self Care | Payer: Medicaid Other | Source: Ambulatory Visit | Attending: Obstetrics & Gynecology | Admitting: Obstetrics & Gynecology

## 2013-11-05 DIAGNOSIS — Z3689 Encounter for other specified antenatal screening: Secondary | ICD-10-CM | POA: Diagnosis present

## 2013-11-05 DIAGNOSIS — O09892 Supervision of other high risk pregnancies, second trimester: Secondary | ICD-10-CM

## 2013-11-05 DIAGNOSIS — O09292 Supervision of pregnancy with other poor reproductive or obstetric history, second trimester: Secondary | ICD-10-CM

## 2013-11-05 DIAGNOSIS — O09899 Supervision of other high risk pregnancies, unspecified trimester: Secondary | ICD-10-CM | POA: Diagnosis not present

## 2013-11-05 DIAGNOSIS — O09299 Supervision of pregnancy with other poor reproductive or obstetric history, unspecified trimester: Secondary | ICD-10-CM

## 2013-11-05 DIAGNOSIS — O3430 Maternal care for cervical incompetence, unspecified trimester: Secondary | ICD-10-CM

## 2013-11-08 ENCOUNTER — Encounter: Payer: Self-pay | Admitting: Advanced Practice Midwife

## 2013-11-10 ENCOUNTER — Ambulatory Visit (INDEPENDENT_AMBULATORY_CARE_PROVIDER_SITE_OTHER): Payer: Medicaid Other | Admitting: Obstetrics & Gynecology

## 2013-11-10 ENCOUNTER — Encounter: Payer: Self-pay | Admitting: Obstetrics & Gynecology

## 2013-11-10 VITALS — BP 105/69 | HR 101 | Temp 97.3°F | Wt 142.2 lb

## 2013-11-10 DIAGNOSIS — R12 Heartburn: Secondary | ICD-10-CM

## 2013-11-10 DIAGNOSIS — O3430 Maternal care for cervical incompetence, unspecified trimester: Secondary | ICD-10-CM

## 2013-11-10 DIAGNOSIS — O9989 Other specified diseases and conditions complicating pregnancy, childbirth and the puerperium: Secondary | ICD-10-CM

## 2013-11-10 DIAGNOSIS — O26893 Other specified pregnancy related conditions, third trimester: Secondary | ICD-10-CM

## 2013-11-10 DIAGNOSIS — O09299 Supervision of pregnancy with other poor reproductive or obstetric history, unspecified trimester: Secondary | ICD-10-CM

## 2013-11-10 LAB — POCT URINALYSIS DIP (DEVICE)
Bilirubin Urine: NEGATIVE
GLUCOSE, UA: NEGATIVE mg/dL
HGB URINE DIPSTICK: NEGATIVE
Ketones, ur: NEGATIVE mg/dL
Leukocytes, UA: NEGATIVE
Nitrite: NEGATIVE
Protein, ur: NEGATIVE mg/dL
SPECIFIC GRAVITY, URINE: 1.02 (ref 1.005–1.030)
UROBILINOGEN UA: 0.2 mg/dL (ref 0.0–1.0)
pH: 7 (ref 5.0–8.0)

## 2013-11-10 MED ORDER — RANITIDINE HCL 150 MG PO CAPS: 150.0000 mg | ORAL_CAPSULE | Freq: Two times a day (BID) | ORAL | Status: DC

## 2013-11-10 NOTE — Patient Instructions (Signed)
Return to clinic for any obstetric concerns or go to [facility] for evaluation  

## 2013-11-10 NOTE — Progress Notes (Signed)
States not taking valtrex, protonix or pnv because doesn't have medicaid, and meds expensive. C/o cramps during and after urination- denies pain otherwise. C/o lower back pain when walks. C/o orange when wiped this am. States did have intercourse last night.

## 2013-11-10 NOTE — Progress Notes (Signed)
Valtrex prescribed for cold sore on lip, no history of herpes genitalis. Will discontinue, no longer needed. Ranitidine prescribed for GERD.  UA negative, will continue to monitor urinary symptoms. Exam revealed scant yellow -brown discharge, cerclage in place, no red blood, cervix closed.  Bleeding, PTL and FM precautions advised.

## 2013-11-24 ENCOUNTER — Encounter: Payer: Self-pay | Admitting: Obstetrics & Gynecology

## 2013-11-24 ENCOUNTER — Ambulatory Visit (INDEPENDENT_AMBULATORY_CARE_PROVIDER_SITE_OTHER): Payer: Medicaid Other | Admitting: Obstetrics & Gynecology

## 2013-11-24 VITALS — BP 99/64 | HR 98 | Temp 96.8°F | Wt 149.0 lb

## 2013-11-24 DIAGNOSIS — O3430 Maternal care for cervical incompetence, unspecified trimester: Secondary | ICD-10-CM

## 2013-11-24 DIAGNOSIS — O0990 Supervision of high risk pregnancy, unspecified, unspecified trimester: Secondary | ICD-10-CM

## 2013-11-24 DIAGNOSIS — O09299 Supervision of pregnancy with other poor reproductive or obstetric history, unspecified trimester: Secondary | ICD-10-CM

## 2013-11-24 DIAGNOSIS — O0991 Supervision of high risk pregnancy, unspecified, first trimester: Secondary | ICD-10-CM

## 2013-11-24 LAB — POCT URINALYSIS DIP (DEVICE)
BILIRUBIN URINE: NEGATIVE
GLUCOSE, UA: NEGATIVE mg/dL
HGB URINE DIPSTICK: NEGATIVE
Ketones, ur: NEGATIVE mg/dL
NITRITE: NEGATIVE
Protein, ur: NEGATIVE mg/dL
Specific Gravity, Urine: 1.025 (ref 1.005–1.030)
Urobilinogen, UA: 0.2 mg/dL (ref 0.0–1.0)
pH: 6 (ref 5.0–8.0)

## 2013-11-24 NOTE — Progress Notes (Signed)
Routine visit. Good FM. Complains of cramps nightly. PTL precautions reviewed. She tells me that the Valtrex prescribed in the past was for a oral cold sore. She denies any h/o vulvar lesions. Cerclage intact. Cultures and removal of cerclage at next visit.

## 2013-11-24 NOTE — Progress Notes (Signed)
Patient reports nightly cramps in lower abdomen/pelvis, pain can be intense at times; reports cramping after using bathroom as well

## 2013-12-06 ENCOUNTER — Encounter (HOSPITAL_COMMUNITY): Payer: Self-pay | Admitting: *Deleted

## 2013-12-06 ENCOUNTER — Inpatient Hospital Stay (HOSPITAL_COMMUNITY)
Admission: AD | Admit: 2013-12-06 | Discharge: 2013-12-06 | Disposition: A | Discharge status: Home / Self Care | Payer: Medicaid Other | Source: Ambulatory Visit | Attending: Obstetrics & Gynecology | Admitting: Obstetrics & Gynecology

## 2013-12-06 DIAGNOSIS — O9989 Other specified diseases and conditions complicating pregnancy, childbirth and the puerperium: Principal | ICD-10-CM

## 2013-12-06 DIAGNOSIS — R82998 Other abnormal findings in urine: Secondary | ICD-10-CM | POA: Insufficient documentation

## 2013-12-06 DIAGNOSIS — N949 Unspecified condition associated with female genital organs and menstrual cycle: Secondary | ICD-10-CM | POA: Insufficient documentation

## 2013-12-06 DIAGNOSIS — O99891 Other specified diseases and conditions complicating pregnancy: Secondary | ICD-10-CM | POA: Insufficient documentation

## 2013-12-06 DIAGNOSIS — R109 Unspecified abdominal pain: Secondary | ICD-10-CM | POA: Insufficient documentation

## 2013-12-06 DIAGNOSIS — O99330 Smoking (tobacco) complicating pregnancy, unspecified trimester: Secondary | ICD-10-CM | POA: Insufficient documentation

## 2013-12-06 DIAGNOSIS — O3430 Maternal care for cervical incompetence, unspecified trimester: Secondary | ICD-10-CM | POA: Insufficient documentation

## 2013-12-06 LAB — URINE MICROSCOPIC-ADD ON

## 2013-12-06 LAB — URINALYSIS, ROUTINE W REFLEX MICROSCOPIC
Bilirubin Urine: NEGATIVE
Glucose, UA: NEGATIVE mg/dL
Ketones, ur: NEGATIVE mg/dL
NITRITE: NEGATIVE
Protein, ur: NEGATIVE mg/dL
Specific Gravity, Urine: 1.025 (ref 1.005–1.030)
UROBILINOGEN UA: 0.2 mg/dL (ref 0.0–1.0)
pH: 6.5 (ref 5.0–8.0)

## 2013-12-06 LAB — WET PREP, GENITAL
Clue Cells Wet Prep HPF POC: NONE SEEN
Trich, Wet Prep: NONE SEEN
Yeast Wet Prep HPF POC: NONE SEEN

## 2013-12-06 MED ORDER — CEPHALEXIN 500 MG PO CAPS: 500.0000 mg | ORAL_CAPSULE | Freq: Two times a day (BID) | ORAL | Status: DC

## 2013-12-06 NOTE — [facility] Note (Signed)
Pt reports she has been having a lot of pain when she tries to get out of the bed, pain with urination.

## 2013-12-06 NOTE — MAU Provider Note (Signed)
History     CSN: [id number]  Arrival date and time: 12/06/13 2113   First Provider Initiated Contact with Patient 12/06/13 2201      Chief Complaint  Patient presents with  . Abdominal Pain   Abdominal Pain Pertinent negatives include no dysuria or hematuria.    Pt is a 18 yo G3P1011 at [redacted]w[redacted]d wks IUP here with report of having lower pelvic pain that started two weeks ago.  Pain is described as pressure with walking and getting out of bed.  Denies vaginal bleeding or leaking of fluid.  No report of contractions.  Sensation increases after emptying bladder.  History of cerclage placed at 19 wks due to 18 wk delivery.    Past Medical History  Diagnosis Date  . Cervical incompetence with baby delivered in second trimester 12/04/2012    Needs cerclage and close follow up with subsequent pregnancies     Past Surgical History  Procedure Laterality Date  . Dilation and evacuation N/A 12/04/2012    Procedure: DILATATION AND EVACUATION;  Surgeon: Tereso Newcomer, MD;  Location: [facility];  Service: Gynecology;  Laterality: N/A;  . Cervical cerclage N/A 08/03/2013    Procedure: CERCLAGE CERVICAL;  Surgeon: Reva Bores, MD;  Location: [facility];  Service: Gynecology;  Laterality: N/A;    History reviewed. No pertinent family history.  History  Substance Use Topics  . Smoking status: Current Some Day Smoker -- 0.25 packs/day    Types: Cigarettes  . Smokeless tobacco: Never Used  . Alcohol Use: Yes     Comment: ocassionally, not while pregnant    Allergies: No Known Allergies  Prescriptions prior to admission  Medication Sig Dispense Refill  . butalbital-acetaminophen-caffeine (FIORICET) 50-325-40 MG per tablet Take 1-2 tablets by mouth every 6 (six) hours as needed for headache. Use sparingly  20 tablet  1  . calcium carbonate (TUMS - DOSED IN MG ELEMENTAL CALCIUM) 500 MG chewable tablet Chew 1 tablet by mouth as needed for indigestion or heartburn.        Review of Systems   Gastrointestinal: Positive for abdominal pain (lower pelvic).  Genitourinary: Negative for dysuria, urgency, hematuria and flank pain.  Musculoskeletal: Positive for back pain (lower, mid).  All other systems reviewed and are negative.  Physical Exam   Blood pressure 113/59, pulse 99, temperature 99 F (37.2 C), temperature source Oral, resp. rate 18, height 5\' 2"  (1.575 m), weight 69.4 kg (153 lb), last menstrual period 03/24/2013, SpO2 100.00%.  Physical Exam  Constitutional: She is oriented to person, place, and time. She appears well-developed and well-nourished. No distress.  HENT:  Head: Normocephalic.  Neck: Normal range of motion. Neck supple.  Cardiovascular: Normal rate, regular rhythm and normal heart sounds.   Respiratory: Effort normal and breath sounds normal.  GI: Soft. There is no tenderness.  Genitourinary: No bleeding around the vagina. Vaginal discharge: yellowish dischage.  Cerclage visually seen; cervix palpates closed  Neurological: She is alert and oriented to person, place, and time.  Skin: Skin is warm and dry.   FHR 130's, +accels Toco -irregular (not felt by patient) MAU Course  Procedures Results for orders placed during the hospital encounter of 12/06/13 (from the past 24 hour(s))  URINALYSIS, ROUTINE W REFLEX MICROSCOPIC     Status: Abnormal   Collection Time    12/06/13  9:20 PM      Result Value Ref Range   Color, Urine YELLOW  YELLOW   APPearance CLEAR  CLEAR   Specific  Gravity, Urine 1.025  1.005 - 1.030   pH 6.5  5.0 - 8.0   Glucose, UA NEGATIVE  NEGATIVE mg/dL   Hgb urine dipstick TRACE (*) NEGATIVE   Bilirubin Urine NEGATIVE  NEGATIVE   Ketones, ur NEGATIVE  NEGATIVE mg/dL   Protein, ur NEGATIVE  NEGATIVE mg/dL   Urobilinogen, UA 0.2  0.0 - 1.0 mg/dL   Nitrite NEGATIVE  NEGATIVE   Leukocytes, UA SMALL (*) NEGATIVE  URINE MICROSCOPIC-ADD ON     Status: Abnormal   Collection Time    12/06/13  9:20 PM      Result Value Ref Range    Squamous Epithelial / LPF FEW (*) RARE   WBC, UA 7-10  <3 WBC/hpf   RBC / HPF 0-2  <3 RBC/hpf   Bacteria, UA FEW (*) RARE  WET PREP, GENITAL     Status: Abnormal   Collection Time    12/06/13 10:10 PM      Result Value Ref Range   Yeast Wet Prep HPF POC NONE SEEN  NONE SEEN   Trich, Wet Prep NONE SEEN  NONE SEEN   Clue Cells Wet Prep HPF POC NONE SEEN  NONE SEEN   WBC, Wet Prep HPF POC MANY (*) NONE SEEN     Assessment and Plan  18 yo G3P1011 at 9538w6d wks IUP Hx of Incompetent Cervix - Cerclage in place Bacteruria   Plan: Discharge to home RX Keflex 500 mg BID x 7 days (no insurance, on [facility] $4 list) Urine culture sent to lab Keep scheduled appointment for Tuesday (cerclage removal that day, pt declined removal tonight due to FOB). GC/CT pending  Melissa NoonWalidah N Muhammad 12/06/2013, 10:03 PM

## 2013-12-06 NOTE — Discharge Instructions (Signed)
Cerclage Cerclage of the cervix is a surgical procedure for an incompetent cervix. An incompetent cervix is a weak cervix that opens up before labor begins. Cerclage of the cervix sews the cervix closed during pregnancy.  LET YOUR HEALTH CARE PROVIDER KNOW ABOUT:   Any allergies you have.  All medicines you are taking, including vitamins, herbs, eye drops, creams, and over-the-counter medicines.  Previous problems you or members of your family have had with the use of anesthetics.  Any blood disorders you have.  Previous surgeries you have had.  Medical conditions you have.  Any recent colds or infections. RISKS AND COMPLICATIONS  Generally, this is a safe procedure. However, as with any procedure, complications can occur. Possible complications include:  Infection.  Bleeding.  Rupturing the amniotic sac (membranes).  Going into early labor and delivery.  Problems with the anesthesia.  Infection of the amniotic sac. BEFORE THE PROCEDURE   Ask your health care provider about changing or stopping your medicines.  Do not eat or drink anything for 6 8 hours before the procedure.  Arrange for someone to drive you home after the procedure. PROCEDURE   An IV tube will be placed in your vein. You will be given a sedative to help you relax. You will be given a medicine that makes you sleep through the procedure (general anesthetic) or a medicine injected into your spine that numbs your body below the waist (spinal or epidural anesthetic). You will be asleep or be numbed through the entire procedure.  A speculum will be placed in vagina to visualize your cervix.  The cervix is then grasped and stitched closed tightly.  Ultrasound may be used to guide the procedure and monitor the baby. AFTER THE PROCEDURE   You will go to a recovery room where you and your unborn baby are monitored. Once you are awake, stable, and taking fluids well, you will be allowed to return to your  room.  You will usually stay in the hospital overnight.  You may get an injection of progesterone to prevent uterine contractions.  You may be given pain-relieving medicines to take with you when you go home.  Have someone drive you home and stay with you for up to 2 days. Document Released: 06/14/2008 Document Revised: 03/04/2013 Document Reviewed: 01/21/2013 Exit[employer] Patient Information 2014 [employer].  

## 2013-12-07 LAB — GC/CHLAMYDIA PROBE AMP
CT PROBE, AMP APTIMA: NEGATIVE
GC Probe RNA: NEGATIVE

## 2013-12-07 NOTE — MAU Provider Note (Signed)
Attestation of Attending Supervision of Advanced Practitioner (CNM/NP): Evaluation and management procedures were performed by the Advanced Practitioner under my supervision and collaboration.  I have reviewed the Advanced Practitioner's note and chart, and I agree with the management and plan.  Loriann Bosserman [name] 7:06 AM

## 2013-12-08 ENCOUNTER — Encounter: Payer: Self-pay | Admitting: Obstetrics & Gynecology

## 2013-12-08 ENCOUNTER — Ambulatory Visit (INDEPENDENT_AMBULATORY_CARE_PROVIDER_SITE_OTHER): Payer: Medicaid Other | Admitting: Obstetrics & Gynecology

## 2013-12-08 VITALS — BP 100/69 | HR 98 | Temp 97.3°F | Wt 151.2 lb

## 2013-12-08 DIAGNOSIS — O3433 Maternal care for cervical incompetence, third trimester: Secondary | ICD-10-CM

## 2013-12-08 DIAGNOSIS — O3430 Maternal care for cervical incompetence, unspecified trimester: Secondary | ICD-10-CM

## 2013-12-08 DIAGNOSIS — O0991 Supervision of high risk pregnancy, unspecified, first trimester: Secondary | ICD-10-CM

## 2013-12-08 LAB — POCT URINALYSIS DIP (DEVICE)
Bilirubin Urine: NEGATIVE
Glucose, UA: NEGATIVE mg/dL
Ketones, ur: NEGATIVE mg/dL
Nitrite: NEGATIVE
PH: 6.5 (ref 5.0–8.0)
Protein, ur: NEGATIVE mg/dL
SPECIFIC GRAVITY, URINE: 1.025 (ref 1.005–1.030)
Urobilinogen, UA: 0.2 mg/dL (ref 0.0–1.0)

## 2013-12-08 LAB — URINE CULTURE

## 2013-12-08 LAB — OB RESULTS CONSOLE GC/CHLAMYDIA
CHLAMYDIA, DNA PROBE: NEGATIVE
GC PROBE AMP, GENITAL: NEGATIVE

## 2013-12-08 LAB — OB RESULTS CONSOLE GBS: GBS: POSITIVE

## 2013-12-08 NOTE — Progress Notes (Signed)
Cerclage removed.  Digital exam not done due to bleeding from cerclage site (Monsel's applied) GBS and cx done today  F/u in 1 week Labor precautions reviewed

## 2013-12-08 NOTE — Addendum Note (Signed)
Addended by: Aldona Lento on: 12/08/2013 12:01 PM   Modules accepted: Orders

## 2013-12-08 NOTE — Progress Notes (Signed)
Patient reports pelvic pain when she walks.

## 2013-12-08 NOTE — Patient Instructions (Signed)
Natural Childbirth °Natural childbirth is going through labor and delivery without any drugs to relieve pain. You also do not use fetal monitors, have a cesarean delivery, or get a sugical cut to enlarge the vaginal opening (episiotomy). With the help of a birthing professional (midwife), you will direct your own labor and delivery as you choose. °Many women chose natural childbirth because they feel more in control and in touch with their labor and delivery. They are also concerned about the medications affecting themselves and the baby. °Pregnant women with a high risk pregnancy should not attempt natural childbirth. It is better to deliver the infant in a hospital if an emergency situation arises. Sometimes, the caregiver has to intervene for the health and safety of the mother and infant. °TWO TECHNIQUES FOR NATURAL CHILDBIRTH:  °· The Lamaze method. This method teaches women that having a baby is normal, healthy, and natural. It also teaches the mother to take a neutral position regarding pain medication and anesthesia and to make an informed decision if and when it is right for them. °· The Bradley method (also called husband coached birth). This method teaches the father to be the birth coach and stresses a natural approach. It also encourages exercise and a balanced diet with good nutrition. The exercises teach relaxation and deep breathing techniques. However, there are also classes to prepare the parents for an emergency situation that may occur. °METHODS OF DEALING WITH LABOR PAIN AND DELIVERY: °· Meditation. °· Yoga. °· Hypnosis. °· Acupuncture. °· Massage. °· Changing positions (walking, rocking, showering, leaning on birth balls). °· Lying in warm water or a jacuzzi. °· Find an activity that keeps your mind off of the labor pain. °· Listen to soft music. °· Visual imagery (focus on a particular object). °BEFORE GOING INTO LABOR °· Be sure you and your spouse/partner are in agreement to have natural  childbirth. °· Decide if your caregiver or a midwife will deliver your baby. °· Decide if you will have your baby in the hospital, birthing center, or at home. °· If you have children, make plans to have someone to take care of them when you go to the hospital. °· Know the distance and the time it takes to go to the delivery center. Make a dry run to be sure. °· Have a bag packed with a night gown, bathrobe, and toiletries ready to take when you go into labor. °· Keep phone numbers of your family and friends handy if you need to call someone when you go into labor. °· Your spouse or partner should go to all the teaching classes. °· Talk with your caregiver about the possibility of a medical emergency and what will happen if that occurs. °ADVANTAGES OF NATURAL CHILDBIRTH °· You are in control of your labor and delivery. °· It is safe. °· There are no medications or anesthetics that may affect you and the fetus. °· There are no invasive procedures such as an episiotomy. °· You and your partner will work together, which can increase your bond. °· Meditation, yoga, massage, and breathing exercises can be learned while pregnant and help you when you are in labor and at delivery. °· In most delivery centers, the family and friends can be involved in the labor and delivery process. °DISADVANTAGES OF NATURAL CHILDBIRTH °· You will experience pain during your labor and delivery. °· The methods of helping relieve your labor pains may not work for you. °· You may feel embarrassed, disappointed, and like a failure   if you decide to change your mind during labor and not have natural childbirth. °AFTER THE DELIVERY °· You will be very tired. °· You will be uncomfortable because of your uterus contracting. You will feel soreness around the vagina. °· You may feel cold and shaky.This is a natural reaction. °· You will be excited, overwhelmed, accomplished, and proud to be a mother. °HOME CARE INSTRUCTIONS  °· Follow the advice and  instructions of your caregiver. °· Follow the instructions of your natural childbirth instructor (Lamaze or Bradley Method). °Document Released: 06/14/2008 Document Revised: 09/24/2011 Document Reviewed: 03/09/2013 °ExitCare® Patient Information ©2014 [employer]. ° °

## 2013-12-09 LAB — CULTURE, BETA STREP (GROUP B ONLY)

## 2013-12-09 LAB — GC/CHLAMYDIA PROBE AMP
CT Probe RNA: NEGATIVE
GC PROBE AMP APTIMA: NEGATIVE

## 2013-12-10 ENCOUNTER — Encounter: Payer: Self-pay | Admitting: Obstetrics & Gynecology

## 2013-12-17 ENCOUNTER — Ambulatory Visit (INDEPENDENT_AMBULATORY_CARE_PROVIDER_SITE_OTHER): Payer: Medicaid Other | Admitting: Obstetrics & Gynecology

## 2013-12-17 VITALS — BP 104/72 | HR 87 | Temp 97.2°F

## 2013-12-17 DIAGNOSIS — O3430 Maternal care for cervical incompetence, unspecified trimester: Secondary | ICD-10-CM

## 2013-12-17 LAB — POCT URINALYSIS DIP (DEVICE)
BILIRUBIN URINE: NEGATIVE
Glucose, UA: NEGATIVE mg/dL
KETONES UR: NEGATIVE mg/dL
Nitrite: NEGATIVE
PH: 6.5 (ref 5.0–8.0)
Protein, ur: NEGATIVE mg/dL
SPECIFIC GRAVITY, URINE: 1.025 (ref 1.005–1.030)
Urobilinogen, UA: 1 mg/dL (ref 0.0–1.0)

## 2013-12-17 NOTE — Progress Notes (Signed)
Scant spotting, no ROM, on Keflex for URI.

## 2013-12-17 NOTE — Progress Notes (Signed)
Edema-feet  Pain/pressure-lower pelvic

## 2013-12-17 NOTE — Patient Instructions (Signed)

## 2013-12-24 ENCOUNTER — Encounter: Payer: Self-pay | Admitting: Obstetrics and Gynecology

## 2013-12-29 ENCOUNTER — Encounter: Payer: Self-pay | Admitting: General Practice

## 2013-12-30 ENCOUNTER — Encounter (HOSPITAL_COMMUNITY): Payer: Self-pay | Admitting: *Deleted

## 2013-12-30 ENCOUNTER — Inpatient Hospital Stay (HOSPITAL_COMMUNITY)
Admission: AD | Admit: 2013-12-30 | Discharge: 2013-12-31 | Disposition: A | Discharge status: Home / Self Care | Payer: Medicaid Other | Source: Ambulatory Visit | Attending: Obstetrics and Gynecology | Admitting: Obstetrics and Gynecology

## 2013-12-30 DIAGNOSIS — R109 Unspecified abdominal pain: Secondary | ICD-10-CM | POA: Insufficient documentation

## 2013-12-30 DIAGNOSIS — O9989 Other specified diseases and conditions complicating pregnancy, childbirth and the puerperium: Principal | ICD-10-CM

## 2013-12-30 DIAGNOSIS — O99891 Other specified diseases and conditions complicating pregnancy: Secondary | ICD-10-CM | POA: Insufficient documentation

## 2013-12-30 DIAGNOSIS — R51 Headache: Secondary | ICD-10-CM | POA: Insufficient documentation

## 2013-12-30 MED ORDER — BUTALBITAL-APAP-CAFFEINE 50-325-40 MG PO TABS
2.0000 | ORAL_TABLET | Freq: Once | ORAL | Status: AC
  Administered 2013-12-30: 2 via ORAL
  Filled 2013-12-30: qty 2

## 2013-12-30 NOTE — MAU Note (Signed)
Pt c/o cramping x 3 days. Denies vaginal bleeding or LOF. +FM. States HA today. Usually takes Fioricet, but ran out. Has not taken anything for her HA today.

## 2013-12-31 ENCOUNTER — Inpatient Hospital Stay (HOSPITAL_COMMUNITY): Payer: Medicaid Other

## 2013-12-31 MED ORDER — BUTALBITAL-APAP-CAFFEINE 50-325-40 MG PO TABS: 1.0000 | ORAL_TABLET | Freq: Four times a day (QID) | ORAL | Status: DC | PRN

## 2013-12-31 NOTE — Discharge Instructions (Signed)
Braxton Hicks Contractions °Contractions of the uterus can occur throughout pregnancy. Contractions are not always a sign that you are in labor.  °WHAT ARE BRAXTON HICKS CONTRACTIONS?  °Contractions that occur before labor are called Braxton Hicks contractions, or false labor. Toward the end of pregnancy (32-34 weeks), these contractions can develop more often and may become more forceful. This is not true labor because these contractions do not result in opening (dilatation) and thinning of the cervix. They are sometimes difficult to tell apart from true labor because these contractions can be forceful and people have different pain tolerances. You should not feel embarrassed if you go to the hospital with false labor. Sometimes, the only way to tell if you are in true labor is for your health care provider to look for changes in the cervix. °If there are no prenatal problems or other health problems associated with the pregnancy, it is completely safe to be sent home with false labor and await the onset of true labor. °HOW CAN YOU TELL THE DIFFERENCE BETWEEN TRUE AND FALSE LABOR? °False Labor °· The contractions of false labor are usually shorter and not as hard as those of true labor.   °· The contractions are usually irregular.   °· The contractions are often felt in the front of the lower abdomen and in the groin.   °· The contractions may go away when you walk around or change positions while lying down.   °· The contractions get weaker and are shorter lasting as time goes on.   °· The contractions do not usually become progressively stronger, regular, and closer together as with true labor.   °True Labor °· Contractions in true labor last 30-70 seconds, become very regular, usually become more intense, and increase in frequency.   °· The contractions do not go away with walking.   °· The discomfort is usually felt in the top of the uterus and spreads to the lower abdomen and low back.   °· True labor can be  determined by your health care provider with an exam. This will show that the cervix is dilating and getting thinner.   °WHAT TO REMEMBER °· Keep up with your usual exercises and follow other instructions given by your health care provider.   °· Take medicines as directed by your health care provider.   °· Keep your regular prenatal appointments.   °· Eat and drink lightly if you think you are going into labor.   °· If Braxton Hicks contractions are making you uncomfortable:   °¨ Change your position from lying down or resting to walking, or from walking to resting.   °¨ Sit and rest in a tub of warm water.   °¨ Drink 2-3 glasses of water. Dehydration may cause these contractions.   °¨ Do slow and deep breathing several times an hour.   °WHEN SHOULD I SEEK IMMEDIATE MEDICAL CARE? °Seek immediate medical care if: °· Your contractions become stronger, more regular, and closer together.   °· You have fluid leaking or gushing from your vagina.   °· You have a fever.   °· You pass blood-tinged mucus.   °· You have vaginal bleeding.   °· You have continuous abdominal pain.   °· You have low back pain that you never had before.   °· You feel your baby's head pushing down and causing pelvic pressure.   °· Your baby is not moving as much as it used to.   °Document Released: 07/02/2005 Document Revised: 07/07/2013 Document Reviewed: 04/13/2013 °ExitCare® Patient Information ©2015 [employer]. This information is not intended to replace advice given to you by your health care   provider. Make sure you discuss any questions you have with your health care provider. ° °Fetal Movement Counts °Patient Name: __________________________________________________ Patient Due Date: ____________________ °Performing a fetal movement count is highly recommended in high-risk pregnancies, but it is good for every pregnant woman to do. Your caregiver may ask you to start counting fetal movements at 28 weeks of the pregnancy. Fetal movements  often increase: °· After eating a full meal. °· After physical activity. °· After eating or drinking something sweet or cold. °· At rest. °Pay attention to when you feel the baby is most active. This will help you notice a pattern of your baby's sleep and wake cycles and what factors contribute to an increase in fetal movement. It is important to perform a fetal movement count at the same time each day when your baby is normally most active.  °HOW TO COUNT FETAL MOVEMENTS °1. Find a quiet and comfortable area to sit or lie down on your left side. Lying on your left side provides the best blood and oxygen circulation to your baby. °2. Write down the day and time on a sheet of paper or in a journal. °3. Start counting kicks, flutters, swishes, rolls, or jabs in a 2 hour period. You should feel at least 10 movements within 2 hours. °4. If you do not feel 10 movements in 2 hours, wait 2-3 hours and count again. Look for a change in the pattern or not enough counts in 2 hours. °SEEK MEDICAL CARE IF: °· You feel less than 10 counts in 2 hours, tried twice. °· There is no movement in over an hour. °· The pattern is changing or taking longer each day to reach 10 counts in 2 hours. °· You feel the baby is not moving as he or she usually does. °Date: ____________ Movements: ____________ Start time: ____________ Finish time: ____________  °Date: ____________ Movements: ____________ Start time: ____________ Finish time: ____________ °Date: ____________ Movements: ____________ Start time: ____________ Finish time: ____________ °Date: ____________ Movements: ____________ Start time: ____________ Finish time: ____________ °Date: ____________ Movements: ____________ Start time: ____________ Finish time: ____________ °Date: ____________ Movements: ____________ Start time: ____________ Finish time: ____________ °Date: ____________ Movements: ____________ Start time: ____________ Finish time: ____________ °Date: ____________  Movements: ____________ Start time: ____________ Finish time: ____________  °Date: ____________ Movements: ____________ Start time: ____________ Finish time: ____________ °Date: ____________ Movements: ____________ Start time: ____________ Finish time: ____________ °Date: ____________ Movements: ____________ Start time: ____________ Finish time: ____________ °Date: ____________ Movements: ____________ Start time: ____________ Finish time: ____________ °Date: ____________ Movements: ____________ Start time: ____________ Finish time: ____________ °Date: ____________ Movements: ____________ Start time: ____________ Finish time: ____________ °Date: ____________ Movements: ____________ Start time: ____________ Finish time: ____________  °Date: ____________ Movements: ____________ Start time: ____________ Finish time: ____________ °Date: ____________ Movements: ____________ Start time: ____________ Finish time: ____________ °Date: ____________ Movements: ____________ Start time: ____________ Finish time: ____________ °Date: ____________ Movements: ____________ Start time: ____________ Finish time: ____________ °Date: ____________ Movements: ____________ Start time: ____________ Finish time: ____________ °Date: ____________ Movements: ____________ Start time: ____________ Finish time: ____________ °Date: ____________ Movements: ____________ Start time: ____________ Finish time: ____________  °Date: ____________ Movements: ____________ Start time: ____________ Finish time: ____________ °Date: ____________ Movements: ____________ Start time: ____________ Finish time: ____________ °Date: ____________ Movements: ____________ Start time: ____________ Finish time: ____________ °Date: ____________ Movements: ____________ Start time: ____________ Finish time: ____________ °Date: ____________ Movements: ____________ Start time: ____________ Finish time: ____________ °Date: ____________ Movements: ____________ Start time:  ____________ Finish time: ____________ °Date: ____________ Movements: ____________   Start time: ____________ Finish time: ____________  °Date: ____________ Movements: ____________ Start time: ____________ Finish time: ____________ °Date: ____________ Movements: ____________ Start time: ____________ Finish time: ____________ °Date: ____________ Movements: ____________ Start time: ____________ Finish time: ____________ °Date: ____________ Movements: ____________ Start time: ____________ Finish time: ____________ °Date: ____________ Movements: ____________ Start time: ____________ Finish time: ____________ °Date: ____________ Movements: ____________ Start time: ____________ Finish time: ____________ °Date: ____________ Movements: ____________ Start time: ____________ Finish time: ____________  °Date: ____________ Movements: ____________ Start time: ____________ Finish time: ____________ °Date: ____________ Movements: ____________ Start time: ____________ Finish time: ____________ °Date: ____________ Movements: ____________ Start time: ____________ Finish time: ____________ °Date: ____________ Movements: ____________ Start time: ____________ Finish time: ____________ °Date: ____________ Movements: ____________ Start time: ____________ Finish time: ____________ °Date: ____________ Movements: ____________ Start time: ____________ Finish time: ____________ °Date: ____________ Movements: ____________ Start time: ____________ Finish time: ____________  °Date: ____________ Movements: ____________ Start time: ____________ Finish time: ____________ °Date: ____________ Movements: ____________ Start time: ____________ Finish time: ____________ °Date: ____________ Movements: ____________ Start time: ____________ Finish time: ____________ °Date: ____________ Movements: ____________ Start time: ____________ Finish time: ____________ °Date: ____________ Movements: ____________ Start time: ____________ Finish time: ____________ °Date:  ____________ Movements: ____________ Start time: ____________ Finish time: ____________ °Date: ____________ Movements: ____________ Start time: ____________ Finish time: ____________  °Date: ____________ Movements: ____________ Start time: ____________ Finish time: ____________ °Date: ____________ Movements: ____________ Start time: ____________ Finish time: ____________ °Date: ____________ Movements: ____________ Start time: ____________ Finish time: ____________ °Date: ____________ Movements: ____________ Start time: ____________ Finish time: ____________ °Date: ____________ Movements: ____________ Start time: ____________ Finish time: ____________ °Date: ____________ Movements: ____________ Start time: ____________ Finish time: ____________ °Document Released: 08/01/2006 Document Revised: 06/18/2012 Document Reviewed: 04/28/2012 °[employer]® Patient Information ©2015 [employer]. This information is not intended to replace advice given to you by your health care provider. Make sure you discuss any questions you have with your health care provider. ° °

## 2014-01-05 ENCOUNTER — Inpatient Hospital Stay (HOSPITAL_COMMUNITY): Payer: Medicaid Other | Admitting: Anesthesiology

## 2014-01-05 ENCOUNTER — Encounter (HOSPITAL_COMMUNITY): Payer: Self-pay | Admitting: *Deleted

## 2014-01-05 ENCOUNTER — Encounter (HOSPITAL_COMMUNITY): Payer: Medicaid Other | Admitting: Anesthesiology

## 2014-01-05 ENCOUNTER — Inpatient Hospital Stay (HOSPITAL_COMMUNITY)
Admission: AD | Admit: 2014-01-05 | Discharge: 2014-01-07 | DRG: 775 | Disposition: A | Discharge status: Home / Self Care | Payer: Medicaid Other | Source: Ambulatory Visit | Attending: Obstetrics & Gynecology | Admitting: Obstetrics & Gynecology

## 2014-01-05 DIAGNOSIS — IMO0001 Reserved for inherently not codable concepts without codable children: Secondary | ICD-10-CM

## 2014-01-05 DIAGNOSIS — O9902 Anemia complicating childbirth: Secondary | ICD-10-CM | POA: Diagnosis present

## 2014-01-05 DIAGNOSIS — D649 Anemia, unspecified: Secondary | ICD-10-CM

## 2014-01-05 DIAGNOSIS — O3430 Maternal care for cervical incompetence, unspecified trimester: Secondary | ICD-10-CM

## 2014-01-05 DIAGNOSIS — O99334 Smoking (tobacco) complicating childbirth: Secondary | ICD-10-CM | POA: Diagnosis present

## 2014-01-05 DIAGNOSIS — O9989 Other specified diseases and conditions complicating pregnancy, childbirth and the puerperium: Secondary | ICD-10-CM

## 2014-01-05 DIAGNOSIS — Z22330 Carrier of Group B streptococcus: Secondary | ICD-10-CM

## 2014-01-05 DIAGNOSIS — O99892 Other specified diseases and conditions complicating childbirth: Secondary | ICD-10-CM | POA: Diagnosis not present

## 2014-01-05 HISTORY — DX: Headache: R51

## 2014-01-05 LAB — CBC
HCT: 31.1 % — ABNORMAL LOW (ref 36.0–46.0)
HEMOGLOBIN: 11.4 g/dL — AB (ref 12.0–15.0)
MCH: 28.6 pg (ref 26.0–34.0)
MCHC: 36.7 g/dL — ABNORMAL HIGH (ref 30.0–36.0)
MCV: 77.9 fL — ABNORMAL LOW (ref 78.0–100.0)
PLATELETS: 247 10*3/uL (ref 150–400)
RBC: 3.99 MIL/uL (ref 3.87–5.11)
RDW: 14.1 % (ref 11.5–15.5)
WBC: 8.2 10*3/uL (ref 4.0–10.5)

## 2014-01-05 MED ORDER — TETANUS-DIPHTH-ACELL PERTUSSIS 5-2.5-18.5 LF-MCG/0.5 IM SUSP: 0.5000 mL | Freq: Once | INTRAMUSCULAR | Status: DC

## 2014-01-05 MED ORDER — SODIUM CHLORIDE 0.9 % IV SOLN
2.0000 g | Freq: Once | INTRAVENOUS | Status: AC
  Administered 2014-01-05: 2 g via INTRAVENOUS
  Filled 2014-01-05: qty 2000

## 2014-01-05 MED ORDER — LACTATED RINGERS IV SOLN: 500.0000 mL | INTRAVENOUS | Status: DC | PRN

## 2014-01-05 MED ORDER — SODIUM CHLORIDE 0.9 % IV SOLN
2.0000 g | Freq: Four times a day (QID) | INTRAVENOUS | Status: DC
  Filled 2014-01-05 (×4): qty 2000

## 2014-01-05 MED ORDER — LANOLIN HYDROUS EX OINT: TOPICAL_OINTMENT | CUTANEOUS | Status: DC | PRN

## 2014-01-05 MED ORDER — SIMETHICONE 80 MG PO CHEW: 80.0000 mg | CHEWABLE_TABLET | ORAL | Status: DC | PRN

## 2014-01-05 MED ORDER — FENTANYL 2.5 MCG/ML BUPIVACAINE 1/10 % EPIDURAL INFUSION (WH - ANES)
14.0000 mL/h | INTRAMUSCULAR | Status: DC | PRN
  Administered 2014-01-05 (×2): 14 mL/h via EPIDURAL
  Filled 2014-01-05 (×2): qty 125

## 2014-01-05 MED ORDER — DIPHENHYDRAMINE HCL 50 MG/ML IJ SOLN: 12.5000 mg | INTRAMUSCULAR | Status: DC | PRN

## 2014-01-05 MED ORDER — ACETAMINOPHEN 325 MG PO TABS: 650.0000 mg | ORAL_TABLET | ORAL | Status: DC | PRN

## 2014-01-05 MED ORDER — PRENATAL MULTIVITAMIN CH
1.0000 | ORAL_TABLET | Freq: Every day | ORAL | Status: DC
  Administered 2014-01-06: 1 via ORAL
  Filled 2014-01-05: qty 1

## 2014-01-05 MED ORDER — PENICILLIN G POTASSIUM 5000000 UNITS IJ SOLR: 2.5000 10*6.[IU] | INTRAVENOUS | Status: DC

## 2014-01-05 MED ORDER — WITCH HAZEL-GLYCERIN EX PADS: 1.0000 "application " | MEDICATED_PAD | CUTANEOUS | Status: DC | PRN

## 2014-01-05 MED ORDER — LIDOCAINE HCL (PF) 1 % IJ SOLN
30.0000 mL | Freq: Once | INTRAMUSCULAR | Status: DC
  Filled 2014-01-05: qty 30

## 2014-01-05 MED ORDER — IBUPROFEN 600 MG PO TABS
600.0000 mg | ORAL_TABLET | Freq: Four times a day (QID) | ORAL | Status: DC | PRN
  Administered 2014-01-05 – 2014-01-07 (×2): 600 mg via ORAL
  Filled 2014-01-05 (×2): qty 1

## 2014-01-05 MED ORDER — IBUPROFEN 600 MG PO TABS
600.0000 mg | ORAL_TABLET | Freq: Four times a day (QID) | ORAL | Status: DC
  Administered 2014-01-05 – 2014-01-07 (×7): 600 mg via ORAL
  Filled 2014-01-05 (×7): qty 1

## 2014-01-05 MED ORDER — OXYTOCIN 40 UNITS IN LACTATED RINGERS INFUSION - SIMPLE MED
62.5000 mL/h | INTRAVENOUS | Status: DC
  Filled 2014-01-05: qty 1000

## 2014-01-05 MED ORDER — ZOLPIDEM TARTRATE 5 MG PO TABS: 5.0000 mg | ORAL_TABLET | Freq: Every evening | ORAL | Status: DC | PRN

## 2014-01-05 MED ORDER — ONDANSETRON HCL 4 MG/2ML IJ SOLN: 4.0000 mg | Freq: Four times a day (QID) | INTRAMUSCULAR | Status: DC | PRN

## 2014-01-05 MED ORDER — PENICILLIN G POTASSIUM 5000000 UNITS IJ SOLR: 5.0000 10*6.[IU] | Freq: Once | INTRAVENOUS | Status: DC

## 2014-01-05 MED ORDER — EPHEDRINE 5 MG/ML INJ
10.0000 mg | INTRAVENOUS | Status: DC | PRN
  Filled 2014-01-05: qty 2

## 2014-01-05 MED ORDER — OXYCODONE-ACETAMINOPHEN 5-325 MG PO TABS
1.0000 | ORAL_TABLET | ORAL | Status: DC | PRN
  Administered 2014-01-05 – 2014-01-07 (×3): 2 via ORAL
  Filled 2014-01-05: qty 2

## 2014-01-05 MED ORDER — LIDOCAINE HCL (PF) 1 % IJ SOLN
30.0000 mL | INTRAMUSCULAR | Status: AC | PRN
  Administered 2014-01-05 (×2): 5 mL via SUBCUTANEOUS

## 2014-01-05 MED ORDER — LACTATED RINGERS IV SOLN
INTRAVENOUS | Status: DC
  Administered 2014-01-05: 05:00:00 via INTRAVENOUS

## 2014-01-05 MED ORDER — BENZOCAINE-MENTHOL 20-0.5 % EX AERO
1.0000 "application " | INHALATION_SPRAY | CUTANEOUS | Status: DC | PRN
  Administered 2014-01-07: 1 via TOPICAL
  Filled 2014-01-05: qty 56

## 2014-01-05 MED ORDER — EPHEDRINE 5 MG/ML INJ
10.0000 mg | INTRAVENOUS | Status: DC | PRN
  Filled 2014-01-05: qty 4
  Filled 2014-01-05: qty 2

## 2014-01-05 MED ORDER — DIPHENHYDRAMINE HCL 25 MG PO CAPS: 25.0000 mg | ORAL_CAPSULE | Freq: Four times a day (QID) | ORAL | Status: DC | PRN

## 2014-01-05 MED ORDER — SENNOSIDES-DOCUSATE SODIUM 8.6-50 MG PO TABS
2.0000 | ORAL_TABLET | ORAL | Status: DC
  Administered 2014-01-05 – 2014-01-07 (×2): 2 via ORAL
  Filled 2014-01-05 (×2): qty 2

## 2014-01-05 MED ORDER — CITRIC ACID-SODIUM CITRATE 334-500 MG/5ML PO SOLN: 30.0000 mL | ORAL | Status: DC | PRN

## 2014-01-05 MED ORDER — PHENYLEPHRINE 40 MCG/ML (10ML) SYRINGE FOR IV PUSH (FOR BLOOD PRESSURE SUPPORT)
80.0000 ug | PREFILLED_SYRINGE | INTRAVENOUS | Status: DC | PRN
  Filled 2014-01-05: qty 2
  Filled 2014-01-05 (×2): qty 10

## 2014-01-05 MED ORDER — OXYTOCIN BOLUS FROM INFUSION: 500.0000 mL | INTRAVENOUS | Status: DC

## 2014-01-05 MED ORDER — OXYCODONE-ACETAMINOPHEN 5-325 MG PO TABS
1.0000 | ORAL_TABLET | ORAL | Status: DC | PRN
  Administered 2014-01-05: 1 via ORAL
  Administered 2014-01-05: 2 via ORAL
  Filled 2014-01-05 (×2): qty 2
  Filled 2014-01-05: qty 1
  Filled 2014-01-05 (×2): qty 2

## 2014-01-05 MED ORDER — DIBUCAINE 1 % RE OINT: 1.0000 "application " | TOPICAL_OINTMENT | RECTAL | Status: DC | PRN

## 2014-01-05 MED ORDER — LACTATED RINGERS IV SOLN
500.0000 mL | Freq: Once | INTRAVENOUS | Status: AC
  Administered 2014-01-05: 500 mL via INTRAVENOUS

## 2014-01-05 MED ORDER — PHENYLEPHRINE 40 MCG/ML (10ML) SYRINGE FOR IV PUSH (FOR BLOOD PRESSURE SUPPORT)
80.0000 ug | PREFILLED_SYRINGE | INTRAVENOUS | Status: DC | PRN
  Administered 2014-01-05 (×2): 80 ug via INTRAVENOUS
  Filled 2014-01-05: qty 2
  Filled 2014-01-05: qty 10

## 2014-01-05 NOTE — Anesthesia Preprocedure Evaluation (Signed)
Anesthesia Evaluation  Patient identified by MRN, date of birth, ID band Patient awake    Reviewed: Allergy & Precautions, H&P , NPO status , Patient's Chart, lab work & pertinent test results  History of Anesthesia Complications Negative for: history of anesthetic complications  Airway Mallampati: II TM Distance: >3 FB Neck ROM: Full    Dental  (+) Teeth Intact   Pulmonary neg sleep apnea, neg COPDneg recent URI, Current Smoker,          Cardiovascular negative cardio ROS  Rhythm:Regular     Neuro/Psych  Headaches, negative psych ROS   GI/Hepatic negative GI ROS, Neg liver ROS,   Endo/Other  negative endocrine ROS  Renal/GU negative Renal ROS     Musculoskeletal   Abdominal   Peds  Hematology  (+) anemia ,   Anesthesia Other Findings   Reproductive/Obstetrics (+) Pregnancy                           Anesthesia Physical Anesthesia Plan  ASA: II  Anesthesia Plan: Epidural   Post-op Pain Management:    Induction:   Airway Management Planned: Natural Airway  Additional Equipment: None  Intra-op Plan:   Post-operative Plan:   Informed Consent: I have reviewed the patients History and Physical, chart, labs and discussed the procedure including the risks, benefits and alternatives for the proposed anesthesia with the patient or authorized representative who has indicated his/her understanding and acceptance.   Dental advisory given  Plan Discussed with: Anesthesiologist  Anesthesia Plan Comments:         Anesthesia Quick Evaluation

## 2014-01-05 NOTE — Progress Notes (Signed)
Notified anesthesiologist of pt decrease sensation and movement of legs--pt used PCA heavily prior to delivery--unable to move left leg at all--slight movement of rt leg from side to side--pt reports feeling tingling and itching in bilateral lower extremities--orders to keep pt on L&D and notify MD anesthesiologist again at 4 hour if no improvement

## 2014-01-05 NOTE — Progress Notes (Signed)
I examined pt and agree with documentation above and resident plan of care. Walidah N Muhammad, CNM  

## 2014-01-05 NOTE — H&P (Addendum)
Pamela Curtis is a 18 y.o. female G3P1011 with IUP at 3036w1d presenting for contractions, onset 22:00 01/04/14, getting more frequent and intense.. Pt states she has been having regular, every 5-7 minutes contractions, associated with none vaginal bleeding.  Membranes are intact, with active fetal movement.   [facility]  Prenatal History/Complications:  [facility]  Dating LMP/Ultrasound: 7  weeks        Ultrasound consistent with LMP: No  Genetic Screen Quad: WNL                 Anatomic US 20 weeks normal female  GTT Early:               Third trimester: 115  TDaP vaccine 10/29/13  Flu vaccine 07/30/13  GBS positive  Baby Food  bottle   Contraception  Nexplanon  Circumcision  yes  Pediatrician  [facility]  Support FOB   Hemoglobin C trait History of pre-term delivery at 18 weeks History of IUGR: 5lb 10oz infant delivered at 39.4 Cerclage placed at 18 weeks this pregnancy   Past Medical History: Past Medical History  Diagnosis Date  . Cervical incompetence with baby delivered in second trimester 12/04/2012    Needs cerclage and close follow up with subsequent pregnancies   . NWGNFAOZ(308.6Headache(784.0)     Past Surgical History: Past Surgical History  Procedure Laterality Date  . Dilation and evacuation N/A 12/04/2012    Procedure: DILATATION AND EVACUATION;  Surgeon: Tereso NewcomerUgonna A Anyanwu, MD;  Location: [facility];  Service: Gynecology;  Laterality: N/A;  . Cervical cerclage N/A 08/03/2013    Procedure: CERCLAGE CERVICAL;  Surgeon: Reva Boresanya S [name], MD;  Location: [facility];  Service: Gynecology;  Laterality: N/A;  . No past surgeries      Obstetrical History: OB History   Grav Para Term Preterm Abortions TAB SAB Ect Mult Living   3 1 1  1  1   1        Social History: History   Social History  . Marital Status: Single    Spouse Name: N/A    Number of Children: N/A  . Years of Education: N/A   Social History Main Topics  . Smoking status: Current Some Day Smoker  -- 0.25 packs/day    Types: Cigarettes  . Smokeless tobacco: Never Used  . Alcohol Use: No     Comment: ocassionally, not while pregnant  . Drug Use: No  . Sexual Activity: Yes    Birth Control/ Protection: None     Comment: last sex 06 Dec 2013   Other Topics Concern  . None   Social History Narrative  . None    Family History: History reviewed. No pertinent family history.  Allergies: No Known Allergies  Prescriptions prior to admission  Medication Sig Dispense Refill  . butalbital-acetaminophen-caffeine (FIORICET) 50-325-40 MG per tablet Take 1-2 tablets by mouth every 6 (six) hours as needed for headache.  20 tablet  0  . calcium carbonate (TUMS - DOSED IN MG ELEMENTAL CALCIUM) 500 MG chewable tablet Chew 1 tablet by mouth as needed for indigestion or heartburn.         Review of Systems   Constitutional: no fever/chills, no headache/vision change, no LE swelling.   Blood pressure 110/63, pulse 103, temperature 98.3 F (36.8 C), temperature source Oral, height 5\' 1"  (1.549 m), weight 70.534 kg (155 lb 8 oz), last menstrual period 03/24/2013. General appearance: alert, cooperative and mild distress with contractions  Lungs: clear to auscultation bilaterally Heart: regular rate and rhythm Abdomen: soft, non-tender; bowel sounds normal Extremities: Homans sign is negative, no sign of DVT Presentation: cephalic Fetal monitoringBaseline: 120 bpm, Variability: Good {> 6 bpm), Accelerations: present and Decelerations: Absent Uterine activity contractions q 6 minutes by TOCO  Dilation: 5 Effacement (%): 80 Exam by:: Remigio EisenmengerBenji Stanley RN   Prenatal labs: ABO, Rh: O/POS/-- (11/06 1140) Antibody: NEG (11/06 1140) Rubella:  Immune RPR: NON REAC (04/16 1140)  HBsAg: NEGATIVE (11/06 1140)  HIV: NONREACTIVE (04/16 1140)  GBS:   neg 1 hr Glucola 115 Genetic screening  Quad WNL Anatomy US normal  Prenatal Transfer Tool  Maternal Diabetes: No Genetic Screening:  Normal Maternal Ultrasounds/Referrals: Normal Fetal Ultrasounds or other Referrals:  None Maternal Substance Abuse:  No Significant Maternal Medications:  None Significant Maternal Lab Results: GBS pos     No results found for this or any previous visit (from the past 24 hour(s)).  Assessment: Pamela Curtis is a 18 y.o. G3P1011 at 7383w1d by US @ 7 weeks here for contractions, active labor #Labor: progressing without augmentation, will recheck and consider augmentation if needed #Pain: Desires Epidural #FWB: Category I #ID: GBS pos - PCN ordered #MOF: bottle #MOC: Nexplanon #Circ:  Yes, outpatient  Sunnie Nielsenlexander, Natalie 01/05/2014, 1:38 AM   I examined pt and agree with documentation above and resident plan of care. Eino FarberWalidah Paul HalfN Muhammad, CNM

## 2014-01-05 NOTE — Progress Notes (Signed)
Pamela Curtis Driscoll is a 18 y.o. G3P1011 at 8614w1d by ultrasound admitted for active labor  Subjective: Seen at 04:45. Patient comfortable on epidural. No additional questions/concerns.   Objective: BP 102/54  Pulse 87  Temp(Src) 98.4 F (36.9 C) (Oral)  Resp 18  Ht 5\' 1"  (1.549 m)  Wt 70.534 kg (155 lb 8 oz)  BMI 29.40 kg/m2  SpO2 98%  LMP 03/24/2013      FHT:  FHR: 130 bpm, variability: moderate,  accelerations:  Abscent,  decelerations:  Absent UC:   regular, every 3-5 minutes SVE:   Dilation: 8 Effacement (%): 100 Station: 0 Exam by:: ansah-mensah, rnc  Labs: Lab Results  Component Value Date   WBC 8.2 01/05/2014   HGB 11.4* 01/05/2014   HCT 31.1* 01/05/2014   MCV 77.9* 01/05/2014   PLT 247 01/05/2014    Assessment / Plan: Spontaneous labor, progressing normally  Labor: Progressing normally Preeclampsia:  no signs or symptoms of toxicity Fetal Wellbeing:  Category I Pain Control:  Epidural I/D:  GBS, Ampicillin given Anticipated MOD:  NSVD  Sunnie Nielsenlexander, Herson Prichard 01/05/2014, 5:04 AM

## 2014-01-05 NOTE — Anesthesia Postprocedure Evaluation (Signed)
  Anesthesia Post-op Note  Patient: Pamela Curtis  Procedure(s) Performed: * No procedures listed *  Patient Location: Mother/Baby  Anesthesia Type:Epidural  Level of Consciousness: awake  Airway and Oxygen Therapy: Patient Spontanous Breathing  Post-op Pain: mild  Post-op Assessment: Patient's Cardiovascular Status Stable and Respiratory Function Stable  Post-op Vital Signs: stable  Last Vitals:  Filed Vitals:   01/05/14 1429  BP: 107/69  Pulse: 78  Temp: 36.9 C  Resp: 20    Complications: No apparent anesthesia complications

## 2014-01-05 NOTE — H&P (Signed)
I examined pt and agree with documentation above and resident plan of care. Aithana Kushner N Muhammad, CNM  

## 2014-01-05 NOTE — MAU Note (Signed)
PT SAYS SSHE STARTED HURTING BAD AT 1030PM.PNC AT   DOWNSTAIRS- VE - CLOSED IN MAU.   DENIES HSV AND MRSA.  GBS-

## 2014-01-05 NOTE — Anesthesia Procedure Notes (Signed)
Epidural Patient location during procedure: OB Start time: 01/05/2014 2:18 AM End time: 01/05/2014 2:29 AM  Staffing Anesthesiologist: MOSER, CHRIS Performed by: anesthesiologist   Preanesthetic Checklist Completed: patient identified, surgical consent, pre-op evaluation, timeout performed, IV checked, risks and benefits discussed and monitors and equipment checked  Epidural Patient position: sitting Prep: site prepped and draped and DuraPrep Patient monitoring: heart rate, cardiac monitor, continuous pulse ox and blood pressure Approach: midline Location: L3-L4 Injection technique: LOR saline  Needle:  Needle type: Tuohy  Needle gauge: 17 G Needle length: 9 cm Needle insertion depth: 4 cm Catheter type: closed end flexible Catheter size: 19 Gauge Catheter at skin depth: 12 cm Test dose: Other  Assessment Events: blood not aspirated, injection not painful, no injection resistance, negative IV test and no paresthesia  Additional Notes H+P and labs checked, risks and benefits discussed with the patient, consent obtained, procedure tolerated well and without complications.  Reason for block:procedure for pain

## 2014-01-05 NOTE — Progress Notes (Signed)
UR completed 

## 2014-01-06 LAB — RPR

## 2014-01-06 NOTE — Progress Notes (Signed)
Post Partum Day 1 Subjective: no complaints, voiding, tolerating PO and + flatus  Objective: Blood pressure 107/74, pulse 76, temperature 97.3 F (36.3 C), temperature source Oral, resp. rate 17, height 5\' 1"  (1.549 m), weight 70.534 kg (155 lb 8 oz), last menstrual period 03/24/2013, SpO2 99.00%, unknown if currently breastfeeding.  Physical Exam:  General: alert, cooperative and no distress Lochia: appropriate Uterine Fundus: firm Incision: n/a DVT Evaluation: No evidence of DVT seen on physical exam.   Recent Labs  01/05/14 0130  HGB 11.4*  HCT 31.1*    Assessment/Plan: Plan for discharge tomorrow Bottle feeding  Nexplanon for contraception  Outpatient circ.    LOS: 1 day   Myra RudeSchmitz, Jeremy E 01/06/2014, 8:02 AM   I have seen and examined this patient and agree with above documentation in the resident's note.   Rulon AbideKeli Beck, M.D. [facility] 01/06/2014 8:57 AM

## 2014-01-07 MED ORDER — OXYCODONE-ACETAMINOPHEN 5-325 MG PO TABS: 1.0000 | ORAL_TABLET | ORAL | Status: DC | PRN

## 2014-01-07 MED ORDER — IBUPROFEN 600 MG PO TABS: 600.0000 mg | ORAL_TABLET | Freq: Four times a day (QID) | ORAL | Status: DC | PRN

## 2014-01-07 NOTE — Discharge Instructions (Signed)
Vaginal Delivery °Care After °Refer to this sheet in the next few weeks. These discharge instructions provide you with information on caring for yourself after delivery. Your caregiver may also give you specific instructions. Your treatment has been planned according to the most current medical practices available, but problems sometimes occur. Call your caregiver if you have any problems or questions after you go home. °HOME CARE INSTRUCTIONS °· Take over-the-counter or prescription medicines only as directed by your caregiver or pharmacist. °· Do not drink alcohol, especially if you are breastfeeding or taking medicine to relieve pain. °· Do not chew or smoke tobacco. °· Do not use illegal drugs. °· Continue to use good perineal care. Good perineal care includes: °¨ Wiping your perineum from front to back. °¨ Keeping your perineum clean. °· Do not use tampons or douche until your caregiver says it is okay. °· Shower, wash your hair, and take tub baths as directed by your caregiver. °· Wear a well-fitting bra that provides breast support. °· Eat healthy foods. °· Drink enough fluids to keep your urine clear or pale yellow. °· Eat high-fiber foods such as whole grain cereals and breads, brown rice, beans, and fresh fruits and vegetables every day. These foods may help prevent or relieve constipation. °· Follow your cargiver's recommendations regarding resumption of activities such as climbing stairs, driving, lifting, exercising, or traveling. °· Talk to your caregiver about resuming sexual activities. Resumption of sexual activities is dependent upon your risk of infection, your rate of healing, and your comfort and desire to resume sexual activity. °· Try to have someone help you with your household activities and your newborn for at least a few days after you leave the hospital. °· Rest as much as possible. Try to rest or take a nap when your newborn is sleeping. °· Increase your activities gradually. °· Keep all  of your scheduled postpartum appointments. It is very important to keep your scheduled follow-up appointments. At these appointments, your caregiver will be checking to make sure that you are healing physically and emotionally. °SEEK MEDICAL CARE IF:  °· You are passing large clots from your vagina. Save any clots to show your caregiver. °· You have a foul smelling discharge from your vagina. °· You have trouble urinating. °· You are urinating frequently. °· You have pain when you urinate. °· You have a change in your bowel movements. °· You have increasing redness, pain, or swelling near your vaginal incision (episiotomy) or vaginal tear. °· You have pus draining from your episiotomy or vaginal tear. °· Your episiotomy or vaginal tear is separating. °· You have painful, hard, or reddened breasts. °· You have a severe headache. °· You have blurred vision or see spots. °· You feel sad or depressed. °· You have thoughts of hurting yourself or your newborn. °· You have questions about your care, the care of your newborn, or medicines. °· You are dizzy or lightheaded. °· You have a rash. °· You have nausea or vomiting. °· You were breastfeeding and have not had a menstrual period within 12 weeks after you stopped breastfeeding. °· You are not breastfeeding and have not had a menstrual period by the 12th week after delivery. °· You have a fever. °SEEK IMMEDIATE MEDICAL CARE IF:  °· You have persistent pain. °· You have chest pain. °· You have shortness of breath. °· You faint. °· You have leg pain. °· You have stomach pain. °· Your vaginal bleeding saturates two or more sanitary pads   in 1 hour. °MAKE SURE YOU:  °· Understand these instructions. °· Will watch your condition. °· Will get help right away if you are not doing well or get worse. ° ° °Document Released: 06/29/2000 Document Revised: 03/26/2012 Document Reviewed: 02/27/2012 °Exit[employer]® Patient Information ©2015 [employer]. This information is not intended to  replace advice given to you by your health care provider. Make sure you discuss any questions you have with your health care provider. ° °

## 2014-01-07 NOTE — Discharge Summary (Signed)
Obstetric Discharge Summary Reason for Admission: onset of labor Prenatal Procedures: NST Intrapartum Procedures: spontaneous vaginal delivery Postpartum Procedures: none Complications-Operative and Postpartum: none Hemoglobin  Date Value Ref Range Status  01/05/2014 11.4* 12.0 - 15.0 g/dL Final     REPEATED TO VERIFY     HCT  Date Value Ref Range Status  01/05/2014 31.1* 36.0 - 46.0 % Final   Delivery Note Called to patient room for delivery. At 8:20 AM a viable female was delivered via Vaginal, Spontaneous Delivery (Presentation: Left Occiput Anterior).  APGAR: 9, 9; weight 6 lb 14.6 oz (3135 g).  Cord clamped x 2 and cut. Active management third stage labor with traction. Pitocin administered after placenta delivered. Placenta status: Intact, Spontaneous.  Cord: 3 vessels with the following complications: None.  Cord pH: n/a. Counts correct. Hemostatic.   Anesthesia: Epidural  Episiotomy: None Lacerations: None Suture Repair: n/a Est. Blood Loss (mL): 300  Mom to postpartum.  Baby to Couplet care / Skin to Skin.  Sunnie Nielsenlexander, Natalie 01/05/2014, 9:39 AM    Physical Exam:  General: alert, cooperative and no distress Lochia: appropriate Uterine Fundus: firm Incision: na DVT Evaluation: No cords or calf tenderness. No significant calf/ankle edema.  Discharge Diagnoses: Term Pregnancy-delivered  Discharge Information: Date: 01/07/2014 Activity: pelvic rest Diet: routine Medications: PNV, Ibuprofen and Percocet Condition: stable Instructions: refer to practice specific booklet Discharge to: home Follow-up Information   [facility] Schedule an appointment as soon as possible for a visit in 5 weeks.   Specialty:  Obstetrics and Gynecology   Contact information:   [address] [place] [address] [phone]      Newborn Data: Live born female  Birth Weight: 6 lb 14.6 oz (3135 g) APGAR: 9, 9  Home with mother.  Pt presented in  active labor at term and progressed quickly to complete and delivered a newborn female as above. Postpartum care was uncomplicated. She is bottle feeding and wants nexplanon for contraception.    Bill Yohn L 01/07/2014, 7:49 AM

## 2014-01-13 NOTE — H&P (Signed)
Attestation of Attending Supervision of Advanced Practitioner (CNM/NP): Evaluation and management procedures were performed by the Advanced Practitioner under my supervision and collaboration.  I have reviewed the Advanced Practitioner's note and chart, and I agree with the management and plan.  [name], CAROLYN 8:55 AM

## 2014-02-05 ENCOUNTER — Encounter: Payer: Self-pay | Admitting: General Practice

## 2014-02-11 ENCOUNTER — Encounter: Payer: Self-pay | Admitting: Obstetrics & Gynecology

## 2014-02-11 ENCOUNTER — Ambulatory Visit (INDEPENDENT_AMBULATORY_CARE_PROVIDER_SITE_OTHER): Payer: Medicaid Other | Admitting: Obstetrics & Gynecology

## 2014-02-11 VITALS — BP 112/66 | HR 85 | Ht 62.0 in | Wt 143.1 lb

## 2014-02-11 DIAGNOSIS — Z3009 Encounter for other general counseling and advice on contraception: Secondary | ICD-10-CM

## 2014-02-11 DIAGNOSIS — Z30017 Encounter for initial prescription of implantable subdermal contraceptive: Secondary | ICD-10-CM

## 2014-02-11 LAB — POCT PREGNANCY, URINE: Preg Test, Ur: NEGATIVE

## 2014-02-11 NOTE — Progress Notes (Signed)
Patient ID: Pamela Curtis, female   DOB: [birth year], 18 y.o.   MRN: [id number] Subjective: wants nexplanon     Pamela Curtis is a 18 y.o. female who presents for a postpartum visit. She is 4 weeks postpartum following a spontaneous vaginal delivery. I have fully reviewed the prenatal and intrapartum course. The delivery was at 40 gestational weeks. Outcome: spontaneous vaginal delivery. Anesthesia: epidural. Postpartum course has been good. Baby's course has been normal. Baby is feeding by bottle -  . Bleeding staining only. Bowel function is normal. Bladder function is normal. Patient is sexually active. Contraception method is condoms and no spermacide. Postpartum depression screening: negative.  The following portions of the patient's history were reviewed and updated as appropriate: allergies, current medications, past family history, past medical history, past social history, past surgical history and problem list.  Review of Systems Pertinent items are noted in HPI.   Objective:    BP 112/66  Pulse 85  Ht 5\' 2"  (1.575 m)  Wt 143 lb 1.6 oz (64.91 kg)  BMI 26.17 kg/m2  Breastfeeding? No  General:  alert, cooperative and no distress   Breasts:     Lungs: clear to auscultation bilaterally  Heart:  regular rate and rhythm, S1, S2 normal, no murmur, click, rub or gallop  Abdomen: soft, non-tender; bowel sounds normal; no masses,  no organomegaly   Vulva:  not evaluated  Vagina: not evaluated  Cervix:     Corpus: not examined  Adnexa:  not evaluated  Rectal Exam: Not performed.        Assessment:     normal postpartum exam. Pap smear not done at today's visit.   Plan:    1. Contraception: abstinence recommended until 2 week return for nexplanon 2.  3. Follow up in: 2 weeks or as needed.   Adam PhenixJames G Arnold, MD 02/11/2014

## 2014-05-17 ENCOUNTER — Encounter: Payer: Self-pay | Admitting: Obstetrics & Gynecology

## 2014-06-27 ENCOUNTER — Emergency Department (HOSPITAL_COMMUNITY): Payer: Medicaid Other

## 2014-06-27 ENCOUNTER — Emergency Department (HOSPITAL_COMMUNITY)
Admission: EM | Admit: 2014-06-27 | Discharge: 2014-06-27 | Disposition: A | Discharge status: Home / Self Care | Payer: Medicaid Other | Attending: Emergency Medicine | Admitting: Emergency Medicine

## 2014-06-27 ENCOUNTER — Encounter (HOSPITAL_COMMUNITY): Payer: Self-pay

## 2014-06-27 DIAGNOSIS — R1033 Periumbilical pain: Secondary | ICD-10-CM | POA: Diagnosis not present

## 2014-06-27 DIAGNOSIS — Z720 Tobacco use: Secondary | ICD-10-CM | POA: Diagnosis not present

## 2014-06-27 DIAGNOSIS — R1030 Lower abdominal pain, unspecified: Secondary | ICD-10-CM

## 2014-06-27 DIAGNOSIS — Z3202 Encounter for pregnancy test, result negative: Secondary | ICD-10-CM | POA: Insufficient documentation

## 2014-06-27 LAB — URINALYSIS, ROUTINE W REFLEX MICROSCOPIC
Bilirubin Urine: NEGATIVE
GLUCOSE, UA: NEGATIVE mg/dL
Hgb urine dipstick: NEGATIVE
KETONES UR: NEGATIVE mg/dL
LEUKOCYTES UA: NEGATIVE
Nitrite: NEGATIVE
PH: 6 (ref 5.0–8.0)
Protein, ur: NEGATIVE mg/dL
SPECIFIC GRAVITY, URINE: 1.025 (ref 1.005–1.030)
Urobilinogen, UA: 1 mg/dL (ref 0.0–1.0)

## 2014-06-27 LAB — CBC WITH DIFFERENTIAL/PLATELET
BASOS ABS: 0 10*3/uL (ref 0.0–0.1)
Basophils Relative: 1 % (ref 0–1)
EOS PCT: 3 % (ref 0–5)
Eosinophils Absolute: 0.2 10*3/uL (ref 0.0–0.7)
HCT: 40.6 % (ref 36.0–46.0)
Hemoglobin: 14.6 g/dL (ref 12.0–15.0)
Lymphocytes Relative: 47 % — ABNORMAL HIGH (ref 12–46)
Lymphs Abs: 3.4 10*3/uL (ref 0.7–4.0)
MCH: 28.7 pg (ref 26.0–34.0)
MCHC: 36 g/dL (ref 30.0–36.0)
MCV: 79.8 fL (ref 78.0–100.0)
Monocytes Absolute: 0.6 10*3/uL (ref 0.1–1.0)
Monocytes Relative: 8 % (ref 3–12)
NEUTROS ABS: 2.9 10*3/uL (ref 1.7–7.7)
Neutrophils Relative %: 41 % — ABNORMAL LOW (ref 43–77)
PLATELETS: 328 10*3/uL (ref 150–400)
RBC: 5.09 MIL/uL (ref 3.87–5.11)
RDW: 13.7 % (ref 11.5–15.5)
WBC: 7.1 10*3/uL (ref 4.0–10.5)

## 2014-06-27 LAB — COMPREHENSIVE METABOLIC PANEL
ALBUMIN: 3.9 g/dL (ref 3.5–5.2)
ALT: 19 U/L (ref 0–35)
AST: 25 U/L (ref 0–37)
Alkaline Phosphatase: 125 U/L — ABNORMAL HIGH (ref 39–117)
Anion gap: 15 (ref 5–15)
BUN: 18 mg/dL (ref 6–23)
CALCIUM: 9.4 mg/dL (ref 8.4–10.5)
CHLORIDE: 103 meq/L (ref 96–112)
CO2: 21 mEq/L (ref 19–32)
Creatinine, Ser: 0.76 mg/dL (ref 0.50–1.10)
GFR calc Af Amer: >90 mL/min (ref >90)
Glucose, Bld: 88 mg/dL (ref 70–99)
Potassium: 3.7 mEq/L (ref 3.7–5.3)
SODIUM: 139 meq/L (ref 137–147)
Total Bilirubin: 0.9 mg/dL (ref 0.3–1.2)
Total Protein: 8.2 g/dL (ref 6.0–8.3)

## 2014-06-27 LAB — WET PREP, GENITAL
Clue Cells Wet Prep HPF POC: NONE SEEN
Trich, Wet Prep: NONE SEEN
WBC, Wet Prep HPF POC: NONE SEEN
Yeast Wet Prep HPF POC: NONE SEEN

## 2014-06-27 LAB — HIV ANTIBODY (ROUTINE TESTING W REFLEX): HIV: NONREACTIVE

## 2014-06-27 LAB — POC URINE PREG, ED: PREG TEST UR: NEGATIVE

## 2014-06-27 LAB — LIPASE, BLOOD: Lipase: 34 U/L (ref 11–59)

## 2014-06-27 MED ORDER — IOHEXOL 300 MG/ML  SOLN
50.0000 mL | Freq: Once | INTRAMUSCULAR | Status: AC | PRN
  Administered 2014-06-27: 50 mL via ORAL

## 2014-06-27 MED ORDER — SODIUM CHLORIDE 0.9 % IV BOLUS (SEPSIS)
1000.0000 mL | Freq: Once | INTRAVENOUS | Status: AC
  Administered 2014-06-27: 1000 mL via INTRAVENOUS

## 2014-06-27 MED ORDER — IOHEXOL 300 MG/ML  SOLN
100.0000 mL | Freq: Once | INTRAMUSCULAR | Status: AC | PRN
  Administered 2014-06-27: 100 mL via INTRAVENOUS

## 2014-06-27 NOTE — ED Provider Notes (Signed)
CSN: [id number]     Arrival date & time 06/27/14  1547 History   First MD Initiated Contact with Patient 06/27/14 1602     Chief Complaint  Patient presents with  . Abdominal Pain     (Consider location/radiation/quality/duration/timing/severity/associated sxs/prior Treatment) Patient is a 18 y.o. female presenting with abdominal pain. The history is provided by the patient. No language interpreter was used.  Abdominal Pain Associated symptoms: no chest pain, no chills, no constipation, no diarrhea, no dysuria, no fever, no nausea, no shortness of breath, no vaginal bleeding, no vaginal discharge and no vomiting   Lurine S Mcphie is a 18 y/o F with PMHx of headache, dilation and evacuation, cervical cerclage presenting to the ED with abdominal pain that started this morning. Patient reported that the pain is localized to just below the umbilical region without radiation. When asked how to describe the pain, patient was unable to do so - reported that it just hurts. Patient reported that the pain is constant, but stated that the intensities vary. Reported that the pain is worse with motion, laughing, and coughing. Stated that she is sexually active, uses protection some times. Denies fever, chills, nausea, vomiting, diarrhea, masslike feeling, melena, hematochezia, dysuria, vaginal discharge, vaginal bleeding, vaginal pain, neck pain, back pain, neck stiffness. LMP Wednesday - 06/23/2014.  PCP none  Past Medical History  Diagnosis Date  . Cervical incompetence with baby delivered in second trimester 12/04/2012    Needs cerclage and close follow up with subsequent pregnancies   . [id number](563.8)    Past Surgical History  Procedure Laterality Date  . Dilation and evacuation N/A 12/04/2012    Procedure: DILATATION AND EVACUATION;  Surgeon: Tereso Newcomer, MD;  Location: [facility];  Service: Gynecology;  Laterality: N/A;  . Cervical cerclage N/A 08/03/2013    Procedure: CERCLAGE CERVICAL;  Surgeon:  Reva Bores, MD;  Location: [facility];  Service: Gynecology;  Laterality: N/A;  . No past surgeries     No family history on file. History  Substance Use Topics  . Smoking status: Current Some Day Smoker -- 0.25 packs/day    Types: Cigarettes  . Smokeless tobacco: Never Used  . Alcohol Use: No     Comment: ocassionally, not while pregnant   OB History    Gravida Para Term Preterm AB TAB SAB Ectopic Multiple Living   3 2 2  1  1   2      Review of Systems  Constitutional: Negative for fever and chills.  Respiratory: Negative for chest tightness and shortness of breath.   Cardiovascular: Negative for chest pain.  Gastrointestinal: Positive for abdominal pain. Negative for nausea, vomiting, diarrhea, constipation, blood in stool and anal bleeding.  Genitourinary: Negative for dysuria, flank pain, decreased urine volume, vaginal bleeding, vaginal discharge, vaginal pain and pelvic pain.  Musculoskeletal: Negative for back pain and neck pain.  Neurological: Negative for dizziness, weakness and headaches.      Allergies  Review of patient's allergies indicates no known allergies.  Home Medications   Prior to Admission medications   Medication Sig Start Date End Date Taking? Authorizing Provider  ibuprofen (ADVIL,MOTRIN) 600 MG tablet Take 1 tablet (600 mg total) by mouth every 6 (six) hours as needed (pain scale < 4). Patient not taking: Reported on 06/27/2014 01/07/14   Vale Haven, MD  oxyCODONE-acetaminophen (PERCOCET/ROXICET) 5-325 MG per tablet Take 1 tablet by mouth every 4 (four) hours as needed for severe pain (moderate - severe pain). Patient not  taking: Reported on 06/27/2014 01/07/14   Vale HavenKeli L Beck, MD   BP 114/63 mmHg  Pulse 100  Temp(Src) 98.7 F (37.1 C) (Oral)  Resp 16  SpO2 97%  LMP 06/23/2014  Breastfeeding? No Physical Exam  Constitutional: She is oriented to person, place, and time. She appears well-developed and well-nourished. No distress.  HENT:  Head:  Normocephalic and atraumatic.  Mouth/Throat: Oropharynx is clear and moist. No oropharyngeal exudate.  Eyes: Conjunctivae and EOM are normal. Pupils are equal, round, and reactive to light. Right eye exhibits no discharge. Left eye exhibits no discharge.  Neck: Normal range of motion. Neck supple. No tracheal deviation present.  Cardiovascular: Normal rate, regular rhythm and normal heart sounds.  Exam reveals no friction rub.   No murmur heard. Pulmonary/Chest: Effort normal and breath sounds normal. No respiratory distress. She has no wheezes. She has no rales.  Abdominal: Soft. Bowel sounds are normal. She exhibits no distension. There is tenderness in the suprapubic area. There is no rebound, no guarding and no CVA tenderness.  Negative abdominal distension  BS normoactive in all 4 quadrants Abdomen soft upon palpation  Discomfort upon palpation to the suprapubic region, just inferior to the umbilicus Negative peritoneal signs Negative guarding or rigidity noted  Genitourinary:  Pelvic Exam: Negative swelling, erythema, inflammation, lesions, sores, deformities, masses noted to the external genitalia. Negative lesions, sores, masses, polyps noted to the vaginal canal. Negative bright red blood in the vaginal canal. Negative discharge noted. Cervix identified with negative friability. Negative CMT or adnexal tenderness bilaterally.  Exam chaperoned with Daivd Councilech, Julie.   Musculoskeletal: Normal range of motion.  Lymphadenopathy:    She has no cervical adenopathy.  Neurological: She is alert and oriented to person, place, and time. No cranial nerve deficit. She exhibits normal muscle tone. Coordination normal.  Skin: Skin is warm and dry. No rash noted. She is not diaphoretic. No erythema.  Psychiatric: She has a normal mood and affect. Her behavior is normal. Thought content normal.  Nursing note and vitals reviewed.   ED Course  Procedures (including critical care time) Labs Review Labs  Reviewed  CBC WITH DIFFERENTIAL - Abnormal; Notable for the following:    Neutrophils Relative % 41 (*)    Lymphocytes Relative 47 (*)    All other components within normal limits  COMPREHENSIVE METABOLIC PANEL - Abnormal; Notable for the following:    Alkaline Phosphatase 125 (*)    All other components within normal limits  WET PREP, GENITAL  GC/CHLAMYDIA PROBE AMP  LIPASE, BLOOD  URINALYSIS, ROUTINE W REFLEX MICROSCOPIC  HIV ANTIBODY (ROUTINE TESTING)  POC URINE PREG, ED    Imaging Review Ct Abdomen Pelvis W Contrast  06/27/2014   CLINICAL DATA:  Abdominal pain just below the umbilicus for 4 days  EXAM: CT ABDOMEN AND PELVIS WITH CONTRAST  TECHNIQUE: Multidetector CT imaging of the abdomen and pelvis was performed using the standard protocol following bolus administration of intravenous contrast.  CONTRAST:  50mL OMNIPAQUE IOHEXOL 300 MG/ML SOLN, 100mL OMNIPAQUE IOHEXOL 300 MG/ML SOLN  COMPARISON:  None.  FINDINGS: Lung bases are free of acute infiltrate or sizable effusion.  The liver, gallbladder, spleen, adrenal glands and pancreas are within normal limits. The kidneys are well visualized bilaterally and show no renal calculi or obstructive change.  The appendix is well visualized and within normal limits. No periappendiceal inflammatory changes are noted. The bladder is well distended. A tampon is noted within the vaginal vault. No pelvic mass lesion is noted.  No acute bony abnormality is noted.  IMPRESSION: No acute abnormality noted.   Electronically Signed   By: Alcide CleverMark  Lukens M.D.   On: 06/27/2014 19:54     EKG Interpretation None      MDM   Final diagnoses:  Lower abdominal pain    Medications  sodium chloride 0.9 % bolus 1,000 mL (0 mLs Intravenous Stopped 06/27/14 2024)  iohexol (OMNIPAQUE) 300 MG/ML solution 100 mL (100 mLs Intravenous Contrast Given 06/27/14 1940)  iohexol (OMNIPAQUE) 300 MG/ML solution 50 mL (50 mLs Oral Contrast Given 06/27/14 1830)    Filed  Vitals:   06/27/14 1606 06/27/14 1706 06/27/14 1947 06/27/14 2127  BP: 109/78 110/85 123/76 114/63  Pulse: 102 99 87 100  Temp: 98.1 F (36.7 C)   98.7 F (37.1 C)  TempSrc: Oral   Oral  Resp: 16 18 18 16   SpO2: 97% 100% 99% 97%   CBC negative elevated leukocytosis. Hemoglobin 14.6, hematocrit 40.6. CMP unremarkable. Mildly elevated alkaline phosphatase of 125. Glucose 88, negative elevated anion gap-15.0 medical events per liter. Lipase negative elevation. Urine pregnancy negative. Wet prep negative for trichomoniasis, clue cells wet prep. Urinalysis negative for infection-negative nitrites or leukocytes. HIV and GC/Chlamydia pending. CT abdomen pelvis with contrast noted abnormalities identified. Doubt UTI. Doubt pyelonephritis. Doubt pancreatitis. Doubt appendicitis. Doubt acute abdominal processes. Doubt TOA. Doubt ovarian torsion. Doubt ectopic or molar pregnancy. Definitive etiology of abdominal pain unknown. Labs and imaging re-assuring. Pelvic exam unremarkable. Patient stable, afebrile. Patient not septic appearing. Discharged patient. Discussed with patient to rest and stay hydrated. Discussed with patient proper diet. [facility] Discussed with patient to closely monitor symptoms and if symptoms are to worsen or change to report back to the ED - strict return instructions given.  Patient agreed to plan of care, understood, all questions answered.      Raymon MuttonMarissa Verlaine Embry, PA-C 06/27/14 2203  Warnell Foresterrey Wofford, MD 06/30/14 [phone]

## 2014-06-27 NOTE — Discharge Instructions (Signed)
Please call your doctor for a followup appointment within 24-48 hours. When you talk to your doctor please let them know that you were seen in the emergency department and have them acquire all of your records so that they can discuss the findings with you and formulate a treatment plan to fully care for your new and ongoing problems. Please call and set-up an appointment with [facility] Please call and set-up an appointment with OBGYN  Please rest and stay hydrated Please avoid food high in fat and grease Please continue to monitor symptoms closely and if symptoms are to worsen or change (fever greater than 101, chills, sweating, nausea, vomiting, chest pain, shortness of breathe, difficulty breathing, weakness, numbness, tingling, worsening or changes to pain pattern, inability to keep food or fluids down, changes to pain, changes to vaginal bleeding, vaginal discharge) please report back to the Emergency Department immediately.     Abdominal Pain, Women Abdominal (stomach, pelvic, or belly) pain can be caused by many things. It is important to tell your doctor:  The location of the pain.  Does it come and go or is it present all the time?  Are there things that start the pain (eating certain foods, exercise)?  Are there other symptoms associated with the pain (fever, nausea, vomiting, diarrhea)? All of this is helpful to know when trying to find the cause of the pain. CAUSES   Stomach: virus or bacteria infection, or ulcer.  Intestine: appendicitis (inflamed appendix), regional ileitis (Crohn's disease), ulcerative colitis (inflamed colon), irritable bowel syndrome, diverticulitis (inflamed diverticulum of the colon), or cancer of the stomach or intestine.  Gallbladder disease or stones in the gallbladder.  Kidney disease, kidney stones, or infection.  Pancreas infection or cancer.  Fibromyalgia (pain disorder).  Diseases of the female organs:  Uterus: fibroid  (non-cancerous) tumors or infection.  Fallopian tubes: infection or tubal pregnancy.  Ovary: cysts or tumors.  Pelvic adhesions (scar tissue).  Endometriosis (uterus lining tissue growing in the pelvis and on the pelvic organs).  Pelvic congestion syndrome (female organs filling up with blood just before the menstrual period).  Pain with the menstrual period.  Pain with ovulation (producing an egg).  Pain with an IUD (intrauterine device, birth control) in the uterus.  Cancer of the female organs.  Functional pain (pain not caused by a disease, may improve without treatment).  Psychological pain.  Depression. DIAGNOSIS  Your doctor will decide the seriousness of your pain by doing an examination.  Blood tests.  X-rays.  Ultrasound.  CT scan (computed tomography, special type of X-ray).  MRI (magnetic resonance imaging).  Cultures, for infection.  Barium enema (dye inserted in the large intestine, to better view it with X-rays).  Colonoscopy (looking in intestine with a lighted tube).  Laparoscopy (minor surgery, looking in abdomen with a lighted tube).  Major abdominal exploratory surgery (looking in abdomen with a large incision). TREATMENT  The treatment will depend on the cause of the pain.   Many cases can be observed and treated at home.  Over-the-counter medicines recommended by your caregiver.  Prescription medicine.  Antibiotics, for infection.  Birth control pills, for painful periods or for ovulation pain.  Hormone treatment, for endometriosis.  Nerve blocking injections.  Physical therapy.  Antidepressants.  Counseling with a psychologist or psychiatrist.  Minor or major surgery. HOME CARE INSTRUCTIONS   Do not take laxatives, unless directed by your caregiver.  Take over-the-counter pain medicine only if ordered by your caregiver. Do  not take aspirin because it can cause an upset stomach or bleeding.  Try a clear liquid diet  (broth or water) as ordered by your caregiver. Slowly move to a bland diet, as tolerated, if the pain is related to the stomach or intestine.  Have a thermometer and take your temperature several times a day, and record it.  Bed rest and sleep, if it helps the pain.  Avoid sexual intercourse, if it causes pain.  Avoid stressful situations.  Keep your follow-up appointments and tests, as your caregiver orders.  If the pain does not go away with medicine or surgery, you may try:  Acupuncture.  Relaxation exercises (yoga, meditation).  Group therapy.  Counseling. SEEK MEDICAL CARE IF:   You notice certain foods cause stomach pain.  Your home care treatment is not helping your pain.  You need stronger pain medicine.  You want your IUD removed.  You feel faint or lightheaded.  You develop nausea and vomiting.  You develop a rash.  You are having side effects or an allergy to your medicine. SEEK IMMEDIATE MEDICAL CARE IF:   Your pain does not go away or gets worse.  You have a fever.  Your pain is felt only in portions of the abdomen. The right side could possibly be appendicitis. The left lower portion of the abdomen could be colitis or diverticulitis.  You are passing blood in your stools (bright red or black tarry stools, with or without vomiting).  You have blood in your urine.  You develop chills, with or without a fever.  You pass out. MAKE SURE YOU:   Understand these instructions.  Will watch your condition.  Will get help right away if you are not doing well or get worse. Document Released: 04/29/2007 Document Revised: 11/16/2013 Document Reviewed: 05/19/2009 [facility] Patient Information 2015 [place], [place]. This information is not intended to replace advice given to you by your health care provider. Make sure you discuss any questions you have with your health care provider.   Emergency Department Resource Guide 1) Find a Doctor and Pay Out of  Pocket Although you won't have to find out who is covered by your insurance plan, it is a good idea to ask around and get recommendations. You will then need to call the office and see if the doctor you have chosen will accept you as a new patient and what types of options they offer for patients who are self-pay. Some doctors offer discounts or will set up payment plans for their patients who do not have insurance, but you will need to ask so you aren't surprised when you get to your appointment.  2) Contact Your Local Health Department Not all health departments have doctors that can see patients for sick visits, but many do, so it is worth a call to see if yours does. If you don't know where your local health department is, you can check in your phone book. The CDC also has a tool to help you locate your state's health department, and many state websites also have listings of all of their local health departments.  3) Find a Walk-in Clinic If your illness is not likely to be very severe or complicated, you may want to try a walk in clinic. These are popping up all over the country in pharmacies, drugstores, and shopping centers. They're usually staffed by nurse practitioners or physician assistants that have been trained to treat common illnesses and complaints. They're usually fairly quick and inexpensive.  However, if you have serious medical issues or chronic medical problems, these are probably not your best option.  No Primary Care Doctor: - Call Health Connect at  [phone] - they can help you locate a primary care doctor that  accepts your insurance, provides certain services, etc. - Physician Referral Service- [phone]  Chronic Pain Problems: Organization         Address  Phone   Notes  [facility]  [phone] Patients need to be referred by their primary care doctor.   Medication Assistance: Organization         Address  Phone   Notes  [facility]  Medication [facility] [address] [place] [place], [place] [address] [phone] --Must be a resident of [facility] -- Must have NO insurance coverage whatsoever (no Medicaid/ Medicare, etc.) -- The pt. MUST have a primary care doctor that directs their care regularly and follows them in the community   [facility]ist  [phone]   [employer]  [phone]    Agencies that provide inexpensive medical care: Organization         Address  Phone   Notes  [facility]  [phone]   [facility]    [phone]   [facility] [address] [place], [place] [address] [phone]   [facility] [address] [place] [place] [phone]   Planned Parenthood    [phone]   [facility]    [phone]   Community [facility] and [facility]  [address], [place] Phone:  [phone], Fax:  [phone] Hours of Operation:  9 am - 6 pm, M-F.  Also accepts Medicaid/Medicare and self-pay.  [facility]  [address], [place] Phone: [phone], Fax: [phone]. Hours of Operation:  8:30 am - 5:30 pm, M-F.  Also accepts Medicaid and self-pay.  [facility] [place] [address] [place] Phone: [phone]   [facility] [address] Natasha Bence [place], [place] [phone], Ext. [phone] Mondays & Thursdays: 7-9 AM.  First 15 patients are seen on a first come, first serve basis.    Medicaid-accepting [facility] Providers:  Organization         Address  Phone   Notes  [facility] [address], [place] [phone] Also accepts self-pay patients.  [facility] [address] Laurell Josephs [place], [place]  [phone]   [facility] [address], [place] [phone]   [facility] [address] [place] [phone]   Renaye Rakers [address], [place]   [phone] Only accepts [place] Access [place] patients after they have their name applied to their card.   Self-Pay (no insurance) in [facility]:  Organization         Address  Phone   Notes  Sickle Cell Patients, [facility] [address] [place], [place] [phone]   [facility] [address] [place], [place] [phone]   [facility] [place]  [address], [place] [phone]   [facility]/Dr. Osei-Bonsu  [address], [place] or [address], [place] [phone] Phone number for both [place] and [place] locations is the same.  [facility]gent [facility] and [facility] [address], [place] [phone]) [phone]   [facility] [place] [address] [place] [address], [place] or [address] [phone]   [facility] [address], [place] [phone], phone; [phone], fax Sees patients 1st and 3rd Saturday of every month.  Must not qualify for public or private insurance (i.e. Medicaid, Medicare, [place] Health Choice, Veterans' Benefits)  Household income should be no more than 200% of the poverty level The clinic cannot treat you if you are pregnant or think you are pregnant  Sexually transmitted diseases are not treated at the clinic.    Dental Care: Organization         Address  Phone  Notes  [facility] [address], [place] [phone] Accepts children up to age 20 who are enrolled in [place] or [place] Health Choice; pregnant women with a Medicaid card; and children who have applied for Medicaid or [place] Health Choice, but were declined, whose parents can pay a reduced fee at time of service.  [facility] [place]  [address], [place]  [phone]) [phone] Accepts children up to age 20 who are enrolled in [place] or [place] Health Choice; pregnant women with a Medicaid card; and children who have applied for Medicaid or [place] Health Choice, but were declined, whose parents can pay a reduced fee at time of service.  [facility]  [address], [place] [phone] Patients are seen by appointment only. Walk-ins are not accepted. [facility] will see patients 20 years of age and older. Monday - Tuesday (8am-5pm) Most Wednesdays (8:30-5pm) $30 per visit, cash only  [facility]  [address], [facility] [place] [phone] Patients are seen by appointment only. Walk-ins are not accepted. [facility] will see patients 20 years of age and older. One Wednesday Evening (Monthly: Volunteer Based).  $30 per visit, cash only  [employer] [facility]  [phone] for adults; Children under age 784, call Graduate Pediatric Dentistry at [phone]. Children aged 604-14, please call [phone] to request a pediatric application.  Dental services are provided in all areas of dental care including fillings, crowns and bridges, complete and partial dentures, implants, gum treatment, root canals, and extractions. Preventive care is also provided. Treatment is provided to both adults and children. Patients are selected via a lottery and there is often a waiting list.   [facility] [address], [place]  [phone]) [phone] [email or web address]   [facility] [address], [place], [place] [phone], Ext. [phone] Second and Fourth Thursday of each month, opens at 6:30 AM; Clinic ends at 9 AM.  Patients are seen on a first-come first-served basis, and a limited number are seen during each clinic.   [facility]  [address], [place], [place] [phone]   Eligibility Requirements You must have lived in [place], [place], or [place]  counties for at least the last three months.   You cannot be eligible for state or federal sponsored [place]healthcare insurance, including CIGNAVeterans Administration, [place]icaid, or [employer]icare.   You generally cannot be eligible for healthcare insurance through your employer.    How to apply: Eligibility screenings are held every Tuesday and Wednesday afternoon from 1:00 pm until 4:00 pm. You  do not need an appointment for the interview!  [facility] [address] [place], [place] [phone]   [facility] Health Department  [phone]   [facility] Health Department  [phone]   [facility] Health Department  [phone]    Behavioral Health Resources in the Community: Intensive Outpatient Programs Organization         Address  Phone  Notes  [facility] [address], [place], [place] [phone]   [facility] [address], [place], [place] [phone]   [facility] [address] [place], [place]  [phone]   [facility] [address],  [place], [place] [phone] or [phone]   Substance Abuse Resources Organization         Address  Phone  Notes  [facility] Drug [facility]  [phone]   [facility]  [phone]   [facility]  [phone]   Floydene Flock  [phone]   Residential & Outpatient Substance Abuse Program  [phone]   Psychological Services Organization         Address  Phone  Notes  [facility] Behavioral Health  [phone]   [facility] Services  [phone]   [facility] Mental Health [address] [place] [phone] or [phone]    Mobile Crisis Teams Organization         Address  Phone  Notes  [facility], Mobile Crisis Care Unit  [phone]   [facility]  [address] [place], [place] [phone]   Doristine Locks [address] [place] [phone]    Self-Help/Support Groups Organization         Address  Phone             Notes  [facility] [place] - variety of support groups  [phone]- I7437963 Call for more information  Narcotics Anonymous (NA), [facility] [address], [employer] [place]  2 meetings at this location   Statistician         Address  Phone  Notes  [facility] [address],    [place]  [phone]   [facility]  [address], [place] [address], [place], [place] [phone]   [facility] [address] [place], [place] [phone] Admissions: 8am-3pm M-F  [facility] [address]    [place], [place] [phone]   [facility] [address] [place], [place], [place] [phone]   [facility] [address],  [place], [place] [phone]   [facility] - Intensive Outpatient [address] Laurell Josephs [address], [place], [place] [phone]   [facility] (Addiction Recovery [facility] [address] [place]  [place], [place] [phone] or [phone]   [facility] ([facility]) [address], [place], [place] [phone] Accepts Medicaid  [facility] [place] [address],  [place] [phone] Substance Abuse/Addiction Treatment   [facility] Organization         Address  Phone  Notes  [facility]  [phone]   Angie Fava, PhD [address] Ervin Knack [place], [place]   [phone] or [phone]   [facility]   [address] [place], [place] [phone]   [facility] [address], [place], [place] [phone] Insurance/Medicaid/sponsorship through [employer] and Families [address]  [place], [place] [phone] [place], [place] [phone]    Dr. Adele Schilder  [phone]   [facility] Dept. 1) [address], [place] 2) [place] 3)  [place] [address], [place] [phone]  [phone]   [place] [phone] or [phone] (After Hours)

## 2014-06-27 NOTE — ED Notes (Signed)
Pelvic cart by bedside. ?

## 2014-06-27 NOTE — ED Notes (Signed)
Patient aware of urine sample needed. Advised patient that we are waiting on the urine to be able to go to CT. Patient states that she can not go at this time and will notify me when she has sample.

## 2014-06-27 NOTE — ED Notes (Signed)
She c/o pain just inferior to umbilicus area since Wed.  She denies fever/n/v/d/dysuria and is in no distress.

## 2014-06-28 LAB — GC/CHLAMYDIA PROBE AMP
CT PROBE, AMP APTIMA: NEGATIVE
GC PROBE AMP APTIMA: NEGATIVE

## 2014-07-23 ENCOUNTER — Emergency Department (HOSPITAL_COMMUNITY)
Admission: EM | Admit: 2014-07-23 | Discharge: 2014-07-23 | Disposition: A | Discharge status: Home / Self Care | Payer: Medicaid Other | Attending: Emergency Medicine | Admitting: Emergency Medicine

## 2014-07-23 ENCOUNTER — Encounter (HOSPITAL_COMMUNITY): Payer: Self-pay | Admitting: Emergency Medicine

## 2014-07-23 DIAGNOSIS — Z720 Tobacco use: Secondary | ICD-10-CM | POA: Diagnosis not present

## 2014-07-23 DIAGNOSIS — J029 Acute pharyngitis, unspecified: Secondary | ICD-10-CM | POA: Diagnosis not present

## 2014-07-23 DIAGNOSIS — J069 Acute upper respiratory infection, unspecified: Secondary | ICD-10-CM | POA: Diagnosis not present

## 2014-07-23 LAB — RAPID STREP SCREEN (MED CTR MEBANE ONLY): Streptococcus, Group A Screen (Direct): NEGATIVE

## 2014-07-23 NOTE — ED Notes (Signed)
Pt states that 4 days ago she began having a headache, burning sensation in nose and eyes intermittently, sore throat onset 3 days ago. Assessment of throat shows no swelling, no white patches.

## 2014-07-23 NOTE — Discharge Instructions (Signed)
Upper Respiratory Infection, Adult °An upper respiratory infection (URI) is also sometimes known as the common cold. The upper respiratory tract includes the nose, sinuses, throat, trachea, and bronchi. Bronchi are the airways leading to the lungs. Most people improve within 1 week, but symptoms can last up to 2 weeks. A residual cough may last even longer.  °CAUSES °Many different viruses can infect the tissues lining the upper respiratory tract. The tissues become irritated and inflamed and often become very moist. Mucus production is also common. A cold is contagious. You can easily spread the virus to others by oral contact. This includes kissing, sharing a glass, coughing, or sneezing. Touching your mouth or nose and then touching a surface, which is then touched by another person, can also spread the virus. °SYMPTOMS  °Symptoms typically develop 1 to 3 days after you come in contact with a cold virus. Symptoms vary from person to person. They may include: °· Runny nose. °· Sneezing. °· Nasal congestion. °· Sinus irritation. °· Sore throat. °· Loss of voice (laryngitis). °· Cough. °· Fatigue. °· Muscle aches. °· Loss of appetite. °· Headache. °· Low-grade fever. °DIAGNOSIS  °You might diagnose your own cold based on familiar symptoms, since most people get a cold 2 to 3 times a year. Your caregiver can confirm this based on your exam. Most importantly, your caregiver can check that your symptoms are not due to another disease such as strep throat, sinusitis, pneumonia, asthma, or epiglottitis. Blood tests, throat tests, and X-rays are not necessary to diagnose a common cold, but they may sometimes be helpful in excluding other more serious diseases. Your caregiver will decide if any further tests are required. °RISKS AND COMPLICATIONS  °You may be at risk for a more severe case of the common cold if you smoke cigarettes, have chronic heart disease (such as heart failure) or lung disease (such as asthma), or if  you have a weakened immune system. The very young and very old are also at risk for more serious infections. Bacterial sinusitis, middle ear infections, and bacterial pneumonia can complicate the common cold. The common cold can worsen asthma and chronic obstructive pulmonary disease (COPD). Sometimes, these complications can require emergency medical care and may be life-threatening. °PREVENTION  °The best way to protect against getting a cold is to practice good hygiene. Avoid oral or hand contact with people with cold symptoms. Wash your hands often if contact occurs. There is no clear evidence that vitamin C, vitamin E, echinacea, or exercise reduces the chance of developing a cold. However, it is always recommended to get plenty of rest and practice good nutrition. °TREATMENT  °Treatment is directed at relieving symptoms. There is no cure. Antibiotics are not effective, because the infection is caused by a virus, not by bacteria. Treatment may include: °· Increased fluid intake. Sports drinks offer valuable electrolytes, sugars, and fluids. °· Breathing heated mist or steam (vaporizer or shower). °· Eating chicken soup or other clear broths, and maintaining good nutrition. °· Getting plenty of rest. °· Using gargles or lozenges for comfort. °· Controlling fevers with ibuprofen or acetaminophen as directed by your caregiver. °· Increasing usage of your inhaler if you have asthma. °Zinc gel and zinc lozenges, taken in the first 24 hours of the common cold, can shorten the duration and lessen the severity of symptoms. Pain medicines may help with fever, muscle aches, and throat pain. A variety of non-prescription medicines are available to treat congestion and runny nose. Your caregiver   can make recommendations and may suggest nasal or lung inhalers for other symptoms.  °HOME CARE INSTRUCTIONS  °· Only take over-the-counter or prescription medicines for pain, discomfort, or fever as directed by your  caregiver. °· Use a warm mist humidifier or inhale steam from a shower to increase air moisture. This may keep secretions moist and make it easier to breathe. °· Drink enough water and fluids to keep your urine clear or pale yellow. °· Rest as needed. °· Return to work when your temperature has returned to normal or as your caregiver advises. You may need to stay home longer to avoid infecting others. You can also use a face mask and careful hand washing to prevent spread of the virus. °SEEK MEDICAL CARE IF:  °· After the first few days, you feel you are getting worse rather than better. °· You need your caregiver's advice about medicines to control symptoms. °· You develop chills, worsening shortness of breath, or brown or red sputum. These may be signs of pneumonia. °· You develop yellow or brown nasal discharge or pain in the face, especially when you bend forward. These may be signs of sinusitis. °· You develop a fever, swollen neck glands, pain with swallowing, or white areas in the back of your throat. These may be signs of strep throat. °SEEK IMMEDIATE MEDICAL CARE IF:  °· You have a fever. °· You develop severe or persistent headache, ear pain, sinus pain, or chest pain. °· You develop wheezing, a prolonged cough, cough up blood, or have a change in your usual mucus (if you have chronic lung disease). °· You develop sore muscles or a stiff neck. °Document Released: 12/26/2000 Document Revised: 09/24/2011 Document Reviewed: 10/07/2013 °ExitCare® Patient Information ©2015 [employer]. This information is not intended to replace advice given to you by your health care provider. Make sure you discuss any questions you have with your health care provider. ° °Emergency Department Resource Guide °1) Find a Doctor and Pay Out of Pocket °Although you won't have to find out who is covered by your insurance plan, it is a good idea to ask around and get recommendations. You will then need to call the office and see if  the doctor you have chosen will accept you as a new patient and what types of options they offer for patients who are self-pay. Some doctors offer discounts or will set up payment plans for their patients who do not have insurance, but you will need to ask so you aren't surprised when you get to your appointment. ° °2) Contact Your Local Health Department °Not all health departments have doctors that can see patients for sick visits, but many do, so it is worth a call to see if yours does. If you don't know where your local health department is, you can check in your phone book. The CDC also has a tool to help you locate your state's health department, and many state websites also have listings of all of their local health departments. ° °3) Find a Walk-in Clinic °If your illness is not likely to be very severe or complicated, you may want to try a walk in clinic. These are popping up all over the country in pharmacies, drugstores, and shopping centers. They're usually staffed by nurse practitioners or physician assistants that have been trained to treat common illnesses and complaints. They're usually fairly quick and inexpensive. However, if you have serious medical issues or chronic medical problems, these are probably not your best option. ° °  No Primary Care Doctor: °- Call [facility] at  832-8000 - they can help you locate a primary care doctor that  accepts your insurance, provides certain services, etc. °- Physician Referral Service- 1-800-533-3463 ° °Chronic Pain Problems: °Organization         Address  Phone   Notes  °[facility]  (336) 297-2271 Patients need to be referred by their primary care doctor.  ° °Medication Assistance: °Organization         Address  Phone   Notes  °[place] [facility] [address] °[place], [place] [address] (336) 641-8030 --Must be a resident of [place] °-- Must have NO insurance coverage whatsoever (no  Medicaid/ Medicare, etc.) °-- The pt. MUST have a primary care doctor that directs their care regularly and follows them in the community °  °[facility]  (866) 331-1348   °[employer]  (888) 892-1162   ° °Agencies that provide inexpensive medical care: °Organization         Address  Phone   Notes  °[facility]  (336) 832-8035   °[facility]    (336) 832-7272   °[facility] [address] °[place], [place] [address] (336) 832-4777   °[facility] [address], °[place] (336) 271-4999   °[facility]    (336) 373-0678   °[facility]    (336) 272-1050   °[facility] ° [address], [place] Phone:  (336) 832-4444, Fax:  (336) 832-4440 Hours of Operation:  9 am - 6 pm, M-F.  Also accepts Medicaid/Medicare and self-pay.  °[facility] ° [address], [place] Phone: (336) 832-3150, Fax: (336) 832-3151. Hours of Operation:  8:30 am - 5:30 pm, M-F.  Also accepts Medicaid and self-pay.  °[facility] [place] [address], [place] Phone: (336) 878-6027   °[facility] [address], [place], [place] (336)723-1848, Ext. 123 Mondays & Thursdays: 7-9 AM.  First 15 patients are seen on a first come, first serve basis. °  ° °Medicaid-accepting [place] Providers: ° °Organization         Address  Phone   Notes  °[facility] [address], [place] (336) 641-2100 Also accepts self-pay patients.  °[facility] [address], [place] ° (336) 856-9996   °[facility] [address], [place] (336) 288-8857   °[facility] [address] [place] [address], [place] (336) 299-7000   °Veita Bland [address], [place]  ° (336) 373-1557 Only accepts [place] Access Medicaid patients after they have their name applied to their card.   ° °Self-Pay (no insurance) in [place]: ° °Organization         Address  Phone   Notes  °Sickle Cell Patients, [facility] [address], [place] (336) 832-1970   °[facility] [address], [place] (336) 832-4400   °[facility] ° [address], [place] (336) 992-4800   °[facility]/Dr. Osei-Bonsu ° [address] [place] [address], [place] or [address], [place] (336) 841-8500 Phone number for both [place] and [place] locations is the same.  °[facility] [address], [place] (336) 299-0000   °  [facility] [address] [place] [address], [place] or [address] (336) 852-7530 °(336) 878-2260   °[facility] [address], [place] (336) 350-1642, phone; (336) 294-5005, fax Sees patients 1st and 3rd Saturday of every month.  Must not qualify for public or private insurance (i.e. Medicaid, Medicare, [place] Health Choice, Veterans' Benefits) • Household income should be no more than 200% of the poverty level •The clinic cannot treat you if you are pregnant or think you are pregnant • Sexually transmitted diseases are not treated at the clinic.  ° ° °Dental Care: °Organization         Address  Phone  Notes  °[facility] [facility] [address], [place] (336) 641-6152 Accepts children up to age 21 who are enrolled in Medicaid or [place] Health Choice; pregnant women with a Medicaid card; and children who have applied for Medicaid or [place] Health Choice, but were declined, whose parents can pay a reduced fee at time of service.  °[facility] [place]  [address], [place] (336) 641-7733 Accepts children up to age 21 who are enrolled in Medicaid or [place] Health Choice; pregnant women with a Medicaid card; and children who have applied for Medicaid or [place] Health Choice,  but were declined, whose parents can pay a reduced fee at time of service.  °[facility] ° [address], [place] (336) 641-4533 Patients are seen by appointment only. Walk-ins are not accepted. [facility] will see patients 18 years of age and older. °Monday - Tuesday (8am-5pm) °Most Wednesdays (8:30-5pm) °$30 per visit, cash only  °[facility] ° [address], [place] (336) 641-4533 Patients are seen by appointment only. Walk-ins are not accepted. [facility] will see patients 18 years of age and older. °One Wednesday Evening (Monthly: Volunteer Based).  $30 per visit, cash only  °[facility]  (919) 537-3737 for adults; Children under age 4, call Graduate Pediatric Dentistry at (919) [phone]. Children aged 4-14, please call (919) [phone] to request a pediatric application. ° Dental services are provided in all areas of dental care including fillings, crowns and bridges, complete and partial dentures, implants, gum treatment, root canals, and extractions. Preventive care is also provided. Treatment is provided to both adults and children. °Patients are selected via a lottery and there is often a waiting list. °  °[facility] [address], °[place] ° (336) 763-8833 [email or web address] °  °[facility] [address], [place], [place] (336)723-1848, Ext. 123 Second and Fourth Thursday of each month, opens at 6:30 AM; Clinic ends at 9 AM.  Patients are seen on a first-come first-served basis, and a limited number are seen during each clinic.  ° °[facility] ° [address], [place],  (336) 723-7904   Eligibility Requirements °You must have lived in [place], [place], or [place] counties for at least the last three months. °  You cannot be eligible for state or federal sponsored healthcare insurance, including Veterans Administration, Medicaid, or Medicare. °  You generally  cannot be eligible for healthcare insurance through your employer.  °  How to apply: °Eligibility screenings are held every Tuesday and Wednesday afternoon from 1:00 pm until 4:00 pm. You do not need an appointment for the interview!  °[facility] [address], [place],  336-631-2330   °  [place] [facility]  336-342-8273   °[facility]  336-703-3100   °[facility]  336-570-6415   ° °Behavioral Health Resources in the Community: °Intensive Outpatient Programs °Organization         Address  Phone  Notes  °[facility] [address], [place], [place] 336-878-6098   °[facility] [address], [place], [place] 336-832-9800   °[facility] [address], [place], [place] ° 336-882-2125   °[facility] [address],  °[place], [place] 1-800-853-5163 or 336-641-4981   °Substance Abuse Resources °Organization         Address  Phone  Notes  °[facility]  336-882-2125   °[facility]  336-784-9470   °[facility]  336-285-9073   °[facility]  336-845-3988   °Residential & Outpatient Substance Abuse Program  1-800-659-3381   °Psychological Services °Organization         Address  Phone  Notes  ° Health  336- 832-9600   °[facility]  336- 378-7881   °[facility] [address], [place] 1-800-853-5163 or 336-641-4981   ° °Mobile Crisis Teams °Organization         Address  Phone  Notes  °[facility], Mobile Crisis Care Unit  1-877-626-1772   °Assertive °[facility] ° [address] [place], [place] 336-834-9664   °Sharon DeEsch [address] °[place] 336-554-5454   ° °Self-Help/Support Groups °Organization         Address  Phone             Notes  °[facility] - variety of support groups  336- 373-1402 Call for more  information  °Narcotics Anonymous (NA), [facility] [address], °[place]  2 meetings at this location  ° °Residential Treatment Programs °Organization         Address  Phone  Notes  °[facility] [address],    °[place]  1-866-801-8205   °[facility] ° [address], [place], [place] 704-293-8524   °[facility] [address], [place] 336-845-3988 Admissions: 8am-3pm M-F  °[facility] [address]    °[place], [place] 336-841-1104   °[facility] [address], [place], [place] 336-379-7146   °[facility] [address]  °[place], [place] 336-285-9073   °[facility] - Intensive Outpatient [address], [place], [place] 336-852-3033   °[facility] [address]  °[place], [place] 1-877-615-2722 or 336-784-9470   °[facility] [address] [place], [place] 336-227-7417 Accepts Medicaid  °[facility] [address]  °[place] 1-800-659-3381 Substance Abuse/Addiction Treatment  ° °[place] Behavioral Health Resources °Organization         Address  Phone  Notes  °[facility]  (888) 581-9988   °Julie Brannon, PhD [address] [place], [place]   (336) 349-5553 or (336) 951-0000   °[facility]   [address] °[place], [place] (336) 349-4454   °[facility] [address], [place],  (336) 342-8316 Insurance/Medicaid/sponsorship through [facility]  °[facility] [address]                                      [place], [place] (336) 342-8316 Therapy/tele-psych/case  °[facility] [address]  ° [place], [place] (336) 349-2233    °Dr. Arfeen  (336) 349-4544   °[facility] [place]  [employer] [place] [facility] 1) [address], [place] °2) [address], [place] °3)  [address], [place] (336)  349-3220 °(336) 342-7768 ° °(336) 342-8140   °[place] [facility] (336) 342-1394 or (336) 342-3537 (After Hours)    ° ° °

## 2014-07-23 NOTE — ED Provider Notes (Signed)
CSN: [id number]     Arrival date & time 07/23/14  1830 History  This chart was scribed for non-physician practitioner, Jinny SandersJoseph Harsimran Westman, PA-C,working with Donnetta HutchingBrian Cook, MD, by Karle PlumberJennifer Tensley, ED Scribe. This patient was seen in room WTR5/WTR5 and the patient's care was started at 7:33 PM.  Chief Complaint  Patient presents with  . Sore Throat   Patient is a 19 y.o. female presenting with pharyngitis. The history is provided by the patient. No language interpreter was used.  Sore Throat    HPI Comments:  Pamela Artspen S Goncalves is a 19 y.o. female who presents to the Emergency Department complaining of ongoing sore throat that began four days ago. She reports her symptoms started as a HA and a burning sensation in her nose and eyes, cough, along with the sore throat. She reports all her symptoms have resolved except the cough and sore throat. She has not taken anything to treat her symptoms. Denies modifying factors. Denies fever, chills, nausea or vomiting.   Past Medical History  Diagnosis Date  . Cervical incompetence with baby delivered in second trimester 12/04/2012    Needs cerclage and close follow up with subsequent pregnancies   . WUJWJXBJ(478.2Headache(784.0)    Past Surgical History  Procedure Laterality Date  . Dilation and evacuation N/A 12/04/2012    Procedure: DILATATION AND EVACUATION;  Surgeon: Tereso NewcomerUgonna A Anyanwu, MD;  Location: [facility];  Service: Gynecology;  Laterality: N/A;  . Cervical cerclage N/A 08/03/2013    Procedure: CERCLAGE CERVICAL;  Surgeon: Reva Boresanya S [name], MD;  Location: [facility];  Service: Gynecology;  Laterality: N/A;  . No past surgeries     History reviewed. No pertinent family history. History  Substance Use Topics  . Smoking status: Current Some Day Smoker -- 0.25 packs/day    Types: Cigarettes  . Smokeless tobacco: Never Used  . Alcohol Use: No     Comment: ocassionally, not while pregnant   OB History    Gravida Para Term Preterm AB TAB SAB Ectopic Multiple Living   3 2 2  1  1    2      Review of Systems  HENT: Positive for sore throat.   All other systems reviewed and are negative.   Allergies  Review of patient's allergies indicates no known allergies.  Home Medications   Prior to Admission medications   Medication Sig Start Date End Date Taking? Authorizing Provider  ibuprofen (ADVIL,MOTRIN) 600 MG tablet Take 1 tablet (600 mg total) by mouth every 6 (six) hours as needed (pain scale < 4). Patient not taking: Reported on 06/27/2014 01/07/14   Vale HavenKeli L Beck, MD  oxyCODONE-acetaminophen (PERCOCET/ROXICET) 5-325 MG per tablet Take 1 tablet by mouth every 4 (four) hours as needed for severe pain (moderate - severe pain). Patient not taking: Reported on 06/27/2014 01/07/14   Vale HavenKeli L Beck, MD   Triage Vitals: BP 106/66 mmHg  Pulse 78  Temp(Src) 98.2 F (36.8 C) (Oral)  Resp 16  SpO2 100%  LMP 06/23/2014 Physical Exam  Constitutional: She is oriented to person, place, and time. She appears well-developed and well-nourished.  HENT:  Head: Normocephalic and atraumatic.  Nose: Mucosal edema present.  Mouth/Throat: Uvula is midline and mucous membranes are normal. No trismus in the jaw. No dental abscesses or uvula swelling. Posterior oropharyngeal erythema (mild) present. No oropharyngeal exudate, posterior oropharyngeal edema or tonsillar abscesses.  Nasal turbinates mildly edematous and mildly erythematous.  Eyes: EOM are normal.  Neck: Normal range of motion.  Cardiovascular:  Normal rate.   Pulmonary/Chest: Effort normal.  Musculoskeletal: Normal range of motion.  Lymphadenopathy:    She has cervical adenopathy (mild).  Neurological: She is alert and oriented to person, place, and time.  Skin: Skin is warm and dry.  Psychiatric: She has a normal mood and affect. Her behavior is normal.  Nursing note and vitals reviewed.   ED Course  Procedures (including critical care time) DIAGNOSTIC STUDIES: Oxygen Saturation is 100% on RA, normal by my  interpretation.   COORDINATION OF CARE: 7:40 PM- Will order rapid strep test. Pt verbalizes understanding and agrees to plan.  Medications - No data to display  Labs Review Labs Reviewed  RAPID STREP SCREEN  CULTURE, GROUP A STREP    Imaging Review No results found.   EKG Interpretation None      MDM   Final diagnoses:  URI (upper respiratory infection)  Viral pharyngitis    Pt afebrile without tonsillar exudate, negative strep. Presents with mild cervical lymphadenopathy, & dysphagia; diagnosis of viral pharyngitis. No abx indicated. DC w symptomatic tx for pain  Pt does not appear dehydrated, but did discuss importance of water rehydration. Presentation non concerning for PTA or infxn spread to soft tissue. No trismus or uvula deviation. Specific return precautions discussed. Pt able to drink water in ED without difficulty with intact air way. Recommended PCP follow up. Patient verbalizes understanding and agreement of this plan. I encouraged patient to call or return to the ER with any worsening of symptoms or should she have any questions or concerns.  I personally performed the services described in this documentation, which was scribed in my presence. The recorded information has been reviewed and is accurate.  BP 106/66 mmHg  Pulse 78  Temp(Src) 98.2 F (36.8 C) (Oral)  Resp 16  SpO2 100%  LMP 06/23/2014  Signed,  Ladona Mow, PA-C 10:11 PM   Monte Fantasia, PA-C 07/23/14 [id number]  Donnetta Hutching, MD 07/24/14 1055

## 2014-07-25 LAB — CULTURE, GROUP A STREP

## 2015-11-01 ENCOUNTER — Emergency Department (HOSPITAL_COMMUNITY)
Admission: EM | Admit: 2015-11-01 | Discharge: 2015-11-01 | Disposition: A | Discharge status: Home / Self Care | Payer: Medicaid Other | Attending: Emergency Medicine | Admitting: Emergency Medicine

## 2015-11-01 ENCOUNTER — Encounter (HOSPITAL_COMMUNITY): Payer: Self-pay

## 2015-11-01 DIAGNOSIS — F17210 Nicotine dependence, cigarettes, uncomplicated: Secondary | ICD-10-CM | POA: Diagnosis not present

## 2015-11-01 DIAGNOSIS — Z3202 Encounter for pregnancy test, result negative: Secondary | ICD-10-CM | POA: Insufficient documentation

## 2015-11-01 DIAGNOSIS — A5901 Trichomonal vulvovaginitis: Secondary | ICD-10-CM | POA: Diagnosis not present

## 2015-11-01 DIAGNOSIS — N3090 Cystitis, unspecified without hematuria: Secondary | ICD-10-CM | POA: Diagnosis not present

## 2015-11-01 DIAGNOSIS — N3091 Cystitis, unspecified with hematuria: Secondary | ICD-10-CM

## 2015-11-01 DIAGNOSIS — Z202 Contact with and (suspected) exposure to infections with a predominantly sexual mode of transmission: Secondary | ICD-10-CM | POA: Diagnosis present

## 2015-11-01 LAB — URINALYSIS, ROUTINE W REFLEX MICROSCOPIC
Bilirubin Urine: NEGATIVE
Glucose, UA: NEGATIVE mg/dL
Ketones, ur: NEGATIVE mg/dL
Nitrite: NEGATIVE
PH: 7.5 (ref 5.0–8.0)
Protein, ur: NEGATIVE mg/dL
Specific Gravity, Urine: 1.023 (ref 1.005–1.030)

## 2015-11-01 LAB — URINE MICROSCOPIC-ADD ON

## 2015-11-01 LAB — WET PREP, GENITAL
Clue Cells Wet Prep HPF POC: NONE SEEN
Sperm: NONE SEEN
YEAST WET PREP: NONE SEEN

## 2015-11-01 LAB — POC URINE PREG, ED: PREG TEST UR: NEGATIVE

## 2015-11-01 MED ORDER — ONDANSETRON 4 MG PO TBDP
8.0000 mg | ORAL_TABLET | Freq: Once | ORAL | Status: AC
  Administered 2015-11-01: 8 mg via ORAL
  Filled 2015-11-01: qty 2

## 2015-11-01 MED ORDER — CEPHALEXIN 500 MG PO CAPS: 500.0000 mg | ORAL_CAPSULE | Freq: Two times a day (BID) | ORAL | Status: DC

## 2015-11-01 MED ORDER — CEPHALEXIN 250 MG PO CAPS
500.0000 mg | ORAL_CAPSULE | Freq: Once | ORAL | Status: AC
  Administered 2015-11-01: 500 mg via ORAL
  Filled 2015-11-01: qty 2

## 2015-11-01 MED ORDER — METRONIDAZOLE 500 MG PO TABS
2000.0000 mg | ORAL_TABLET | Freq: Once | ORAL | Status: AC
  Administered 2015-11-01: 2000 mg via ORAL
  Filled 2015-11-01: qty 4

## 2015-11-01 NOTE — ED Notes (Addendum)
Pt reports LMP 10-16-15, started bleeding and cramping again today at 6pm.  Pt came straight to ED when bleeding started.  Pt wants pregnancy test and checked for STD (pt just started seeing someone and she has been getting texts from unknown people telling her to go get checked for STD's).

## 2015-11-01 NOTE — ED Provider Notes (Signed)
CSN: [id number]     Arrival date & time 11/01/15  1929 History   First MD Initiated Contact with Patient 11/01/15 2152     Chief Complaint  Patient presents with  . SEXUALLY TRANSMITTED DISEASE     (Consider location/radiation/quality/duration/timing/severity/associated sxs/prior Treatment) HPI Comments: 20 year old female presents to the emergency department for evaluation of vaginal bleeding. Patient states that her last menstrual period was on 10/16/2015. She has had some sporadic lower abdominal cramping as well as vaginal bleeding with onset at 1800 today. She states that she notices the blood only when wiping. She denies needing any pads or tampons for bleeding control. She has had no dysuria, fever, or bowel changes. She states that she has been receiving text messages from unknown numbers telling her to get checked for STDs. She reports being sexually active with one partner in the last 6 months and denies the use of condoms. She is also concerned about pregnancy.  The history is provided by the patient. No language interpreter was used.    Past Medical History  Diagnosis Date  . Cervical incompetence with baby delivered in second trimester 12/04/2012    Needs cerclage and close follow up with subsequent pregnancies   . [id number](308.6)    Past Surgical History  Procedure Laterality Date  . Dilation and evacuation N/A 12/04/2012    Procedure: DILATATION AND EVACUATION;  Surgeon: Tereso Newcomer, MD;  Location: [facility];  Service: Gynecology;  Laterality: N/A;  . Cervical cerclage N/A 08/03/2013    Procedure: CERCLAGE CERVICAL;  Surgeon: Reva Bores, MD;  Location: [facility];  Service: Gynecology;  Laterality: N/A;  . No past surgeries     History reviewed. No pertinent family history. Social History  Substance Use Topics  . Smoking status: Current Every Day Smoker -- 0.25 packs/day    Types: Cigarettes  . Smokeless tobacco: Never Used  . Alcohol Use: No     Comment:  ocassionally, not while pregnant   OB History    Gravida Para Term Preterm AB TAB SAB Ectopic Multiple Living   Review of Systems  Constitutional: Negative for fever.  Gastrointestinal: Negative for vomiting and abdominal pain.  Genitourinary: Positive for vaginal bleeding. Negative for dysuria and vaginal discharge.  All other systems reviewed and are negative.   Allergies  Review of patient's allergies indicates no known allergies.  Home Medications   Prior to Admission medications   Medication Sig Start Date End Date Taking? Authorizing Provider  cephALEXin (KEFLEX) 500 MG capsule Take 1 capsule (500 mg total) by mouth 2 (two) times daily. 11/01/15   Antony Madura, PA-C  ibuprofen (ADVIL,MOTRIN) 600 MG tablet Take 1 tablet (600 mg total) by mouth every 6 (six) hours as needed (pain scale < 4). Patient not taking: Reported on 06/27/2014 01/07/14   Vale Haven, MD  oxyCODONE-acetaminophen (PERCOCET/ROXICET) 5-325 MG per tablet Take 1 tablet by mouth every 4 (four) hours as needed for severe pain (moderate - severe pain). Patient not taking: Reported on 06/27/2014 01/07/14   Vale Haven, MD   BP 115/79 mmHg  Pulse 100  Temp(Src) 98.2 F (36.8 C) (Oral)  Resp 16  Ht  (1.651 m)  Wt 85.594 kg  BMI 31.40 kg/m2  SpO2 98%  LMP 10/16/2015   Physical Exam  Constitutional: She is oriented to person, place, and time. She appears well-developed and well-nourished. No distress.  Nontoxic/nonseptic appearing  HENT:  Head: Normocephalic and atraumatic.  Eyes: Conjunctivae and EOM are normal. No scleral icterus.  Neck: Normal range of motion.  Cardiovascular: Normal rate, regular rhythm and intact distal pulses.   Pulmonary/Chest: Effort normal. No respiratory distress. She has no wheezes.  Respirations even and unlabored  Abdominal: Soft. She exhibits no distension. There is no tenderness. There is no rebound.  Soft, nontender abdomen. No mass. No peritoneal  signs.  Genitourinary: There is no rash, tenderness or lesion on the right labia. There is no rash, tenderness or lesion on the left labia. Uterus is not tender. Cervix exhibits friability. Cervix exhibits no motion tenderness. Right adnexum displays no mass and no tenderness. Left adnexum displays no mass and no tenderness. No bleeding in the vagina. Vaginal discharge (scant white discharge) found.  Musculoskeletal: Normal range of motion.  Neurological: She is alert and oriented to person, place, and time. She exhibits normal muscle tone. Coordination normal.  Patient ambulatory with steady gait, moving all extremities.  Skin: Skin is warm and dry. No rash noted. She is not diaphoretic. No erythema. No pallor.  Psychiatric: She has a normal mood and affect. Her behavior is normal.  Nursing note and vitals reviewed.   ED Course  Procedures (including critical care time) Labs Review Labs Reviewed  WET PREP, GENITAL - Abnormal; Notable for the following:    Trich, Wet Prep PRESENT (*)    WBC, Wet Prep HPF POC MANY (*)    All other components within normal limits  URINALYSIS, ROUTINE W REFLEX MICROSCOPIC (NOT AT [facility]) - Abnormal; Notable for the following:    APPearance TURBID (*)    Hgb urine dipstick LARGE (*)    Leukocytes, UA MODERATE (*)    All other components within normal limits  URINE MICROSCOPIC-ADD ON - Abnormal; Notable for the following:    Squamous Epithelial / LPF 6-30 (*)    Bacteria, UA MANY (*)    All other components within normal limits  URINE CULTURE  POC URINE PREG, ED  GC/CHLAMYDIA PROBE AMP ([place]) NOT AT [facility]    Imaging Review No results found.   I have personally reviewed and evaluated these images and lab results as part of my medical decision-making.   EKG Interpretation None      MDM   Final diagnoses:  Hemorrhagic cystitis  Trichomonal vaginitis    20 year old female presents to the emergency department reporting vaginal bleeding,  though she states that bleeding is only present upon wiping when using the bathroom. No blood noted in the vaginal vault. No cervical motion or adnexal tenderness. There was cervical friability noted. Urine pregnancy is negative and urinalysis suggestive of hemorrhagic cystitis. No abdominal pain, nausea, or vomiting to suggest kidney stone. Patient also noted to have Trichomonas on her wet prep.   She was treated in the emergency department with 2000 mg of Flagyl. Initial dose of Keflex also given. Zofran ordered to prevent nausea and vomiting given large doses of antibiotics given in the ED. Gonorrhea and chlamydia cultures are currently pending. Patient has been instructed to follow-up with the health department in 2 days regarding the results of these tests. Return precautions discussed and provided. Patient discharged in satisfactory condition with no unaddressed concerns.   Filed Vitals:   11/01/15 2017  BP: 115/79  Pulse: 100  Temp: 98.2 F (36.8 C)  TempSrc: Oral  Resp: 16  Height: 5\' 5"  (1.651 m)  Weight: 85.594 kg  SpO2: 98%     Tresa EndoKelly  Raliegh Ip 11/01/15 2303  Gerhard Munch, MD 11/01/15 2330

## 2015-11-01 NOTE — Discharge Instructions (Signed)
Trichomoniasis °Trichomoniasis is an infection caused by an organism called Trichomonas. The infection can affect both women and men. In women, the outer female genitalia and the vagina are affected. In men, the penis is mainly affected, but the prostate and other reproductive organs can also be involved. Trichomoniasis is a sexually transmitted infection (STI) and is most often passed to another person through sexual contact.  °RISK FACTORS °· Having unprotected sexual intercourse. °· Having sexual intercourse with an infected partner. °SIGNS AND SYMPTOMS  °Symptoms of trichomoniasis in women include: °· Abnormal gray-green frothy vaginal discharge. °· Itching and irritation of the vagina. °· Itching and irritation of the area outside the vagina. °Symptoms of trichomoniasis in men include:  °· Penile discharge with or without pain. °· Pain during urination. This results from inflammation of the urethra. °DIAGNOSIS  °Trichomoniasis may be found during a Pap test or physical exam. Your health care provider may use one of the following methods to help diagnose this infection: °· Testing the pH of the vagina with a test tape. °· Using a vaginal swab test that checks for the Trichomonas organism. A test is available that provides results within a few minutes. °· Examining a urine sample. °· Testing vaginal secretions. °Your health care provider may test you for other STIs, including HIV. °TREATMENT  °· You may be given medicine to fight the infection. Women should inform their health care provider if they could be or are pregnant. Some medicines used to treat the infection should not be taken during pregnancy. °· Your health care provider may recommend over-the-counter medicines or creams to decrease itching or irritation. °· Your sexual partner will need to be treated if infected. °· Your health care provider may test you for infection again 3 months after treatment. °HOME CARE INSTRUCTIONS  °· Take medicines only as  directed by your health care provider. °· Take over-the-counter medicine for itching or irritation as directed by your health care provider. °· Do not have sexual intercourse while you have the infection. °· Women should not douche or wear tampons while they have the infection. °· Discuss your infection with your partner. Your partner may have gotten the infection from you, or you may have gotten it from your partner. °· Have your sex partner get examined and treated if necessary. °· Practice safe, informed, and protected sex. °· See your health care provider for other STI testing. °SEEK MEDICAL CARE IF:  °· You still have symptoms after you finish your medicine. °· You develop abdominal pain. °· You have pain when you urinate. °· You have bleeding after sexual intercourse. °· You develop a rash. °· Your medicine makes you sick or makes you throw up (vomit). °MAKE SURE YOU: °· Understand these instructions. °· Will watch your condition. °· Will get help right away if you are not doing well or get worse. °  °This information is not intended to replace advice given to you by your health care provider. Make sure you discuss any questions you have with your health care provider. °  °Document Released: 12/26/2000 Document Revised: 07/23/2014 Document Reviewed: 04/13/2013 °Elsevier Interactive Patient Education ©2016 [employer] °Urinary Tract Infection °Urinary tract infections (UTIs) can develop anywhere along your urinary tract. Your urinary tract is your body's drainage system for removing wastes and extra water. Your urinary tract includes two kidneys, two ureters, a bladder, and a urethra. Your kidneys are a pair of bean-shaped organs. Each kidney is about the size of your fist. They are   located below your ribs, one on each side of your spine. °CAUSES °Infections are caused by microbes, which are microscopic organisms, including fungi, viruses, and bacteria. These organisms are so small that they can only be seen  through a microscope. Bacteria are the microbes that most commonly cause UTIs. °SYMPTOMS  °Symptoms of UTIs may vary by age and gender of the patient and by the location of the infection. Symptoms in young women typically include a frequent and intense urge to urinate and a painful, burning feeling in the bladder or urethra during urination. Older women and men are more likely to be tired, shaky, and weak and have muscle aches and abdominal pain. A fever may mean the infection is in your kidneys. Other symptoms of a kidney infection include pain in your back or sides below the ribs, nausea, and vomiting. °DIAGNOSIS °To diagnose a UTI, your caregiver will ask you about your symptoms. Your caregiver will also ask you to provide a urine sample. The urine sample will be tested for bacteria and white blood cells. White blood cells are made by your body to help fight infection. °TREATMENT  °Typically, UTIs can be treated with medication. Because most UTIs are caused by a bacterial infection, they usually can be treated with the use of antibiotics. The choice of antibiotic and length of treatment depend on your symptoms and the type of bacteria causing your infection. °HOME CARE INSTRUCTIONS °· If you were prescribed antibiotics, take them exactly as your caregiver instructs you. Finish the medication even if you feel better after you have only taken some of the medication. °· Drink enough water and fluids to keep your urine clear or pale yellow. °· Avoid caffeine, tea, and carbonated beverages. They tend to irritate your bladder. °· Empty your bladder often. Avoid holding urine for long periods of time. °· Empty your bladder before and after sexual intercourse. °· After a bowel movement, women should cleanse from front to back. Use each tissue only once. °SEEK MEDICAL CARE IF:  °· You have back pain. °· You develop a fever. °· Your symptoms do not begin to resolve within 3 days. °SEEK IMMEDIATE MEDICAL CARE IF:  °· You  have severe back pain or lower abdominal pain. °· You develop chills. °· You have nausea or vomiting. °· You have continued burning or discomfort with urination. °MAKE SURE YOU:  °· Understand these instructions. °· Will watch your condition. °· Will get help right away if you are not doing well or get worse. °  °This information is not intended to replace advice given to you by your health care provider. Make sure you discuss any questions you have with your health care provider. °  °Document Released: 04/11/2005 Document Revised: 03/23/2015 Document Reviewed: 08/10/2011 °Elsevier Interactive Patient Education ©2016 Elsevier Inc. ° °

## 2015-11-02 LAB — GC/CHLAMYDIA PROBE AMP (~~LOC~~) NOT AT ARMC
Chlamydia: NEGATIVE
Neisseria Gonorrhea: NEGATIVE

## 2015-11-03 LAB — URINE CULTURE

## 2015-12-07 ENCOUNTER — Encounter (HOSPITAL_COMMUNITY): Payer: Self-pay | Admitting: *Deleted

## 2015-12-07 ENCOUNTER — Inpatient Hospital Stay (HOSPITAL_COMMUNITY)
Admission: AD | Admit: 2015-12-07 | Discharge: 2015-12-08 | Disposition: A | Discharge status: Home / Self Care | Payer: Medicaid Other | Source: Ambulatory Visit | Attending: Obstetrics & Gynecology | Admitting: Obstetrics & Gynecology

## 2015-12-07 DIAGNOSIS — Z3A01 Less than 8 weeks gestation of pregnancy: Secondary | ICD-10-CM | POA: Insufficient documentation

## 2015-12-07 DIAGNOSIS — O99331 Smoking (tobacco) complicating pregnancy, first trimester: Secondary | ICD-10-CM | POA: Diagnosis not present

## 2015-12-07 DIAGNOSIS — R109 Unspecified abdominal pain: Secondary | ICD-10-CM | POA: Diagnosis present

## 2015-12-07 DIAGNOSIS — O26851 Spotting complicating pregnancy, first trimester: Secondary | ICD-10-CM | POA: Diagnosis not present

## 2015-12-07 DIAGNOSIS — O209 Hemorrhage in early pregnancy, unspecified: Secondary | ICD-10-CM | POA: Diagnosis not present

## 2015-12-07 DIAGNOSIS — R51 Headache: Secondary | ICD-10-CM | POA: Diagnosis not present

## 2015-12-07 DIAGNOSIS — F17210 Nicotine dependence, cigarettes, uncomplicated: Secondary | ICD-10-CM | POA: Diagnosis not present

## 2015-12-07 LAB — POCT PREGNANCY, URINE: Preg Test, Ur: POSITIVE — AB

## 2015-12-07 NOTE — MAU Provider Note (Signed)
History     CSN: [id number]  Arrival date and time: 12/07/15 2312   First Provider Initiated Contact with Patient 12/07/15 2350      Chief Complaint  Patient presents with  . Abdominal Pain  . Headache  . Possible Pregnancy   HPI Pamela Curtis is a 20 y.o. [phone] at Unknown who presents to MAU today with complaint of brown discharge and intermittent lower abdominal pain x 2-3 weeks. The patient states unsure LMP and recent +HPT. She asked numerous times how far along she was and this seems to be her main concern. She also endorses headache rated at 8/10 now. She has not taken anything for pain. She denies heavy vaginal bleeding, fever, UTI symptoms, N/V/D or constipation. She also denies any pain currently.   OB History    Gravida Para Term Preterm AB TAB SAB Ectopic Multiple Living   4 2 2  1  1   2       Past Medical History  Diagnosis Date  . Cervical incompetence with baby delivered in second trimester 12/04/2012    Needs cerclage and close follow up with subsequent pregnancies   . JYNWGNFA(213.0)     Past Surgical History  Procedure Laterality Date  . Dilation and evacuation N/A 12/04/2012    Procedure: DILATATION AND EVACUATION;  Surgeon: Tereso Newcomer, MD;  Location: [facility];  Service: Gynecology;  Laterality: N/A;  . Cervical cerclage N/A 08/03/2013    Procedure: CERCLAGE CERVICAL;  Surgeon: Reva Bores, MD;  Location: [facility];  Service: Gynecology;  Laterality: N/A;  . No past surgeries      History reviewed. No pertinent family history.  Social History  Substance Use Topics  . Smoking status: Current Every Day Smoker -- 0.25 packs/day    Types: Cigarettes  . Smokeless tobacco: Never Used  . Alcohol Use: No     Comment: ocassionally, not while pregnant    Allergies: No Known Allergies  No prescriptions prior to admission    Review of Systems  Constitutional: Negative for fever and malaise/fatigue.  Gastrointestinal: Negative for nausea,  vomiting, abdominal pain, diarrhea and constipation.  Genitourinary: Negative for dysuria, urgency and frequency.       + vaginal bleeding  Neurological: Positive for headaches.   Physical Exam   Blood pressure 120/67, pulse 79, temperature 98 F (36.7 C), temperature source Oral, resp. rate 18, not currently breastfeeding.  Physical Exam  Nursing note and vitals reviewed. Constitutional: She is oriented to person, place, and time. She appears well-developed and well-nourished. No distress.  HENT:  Head: Normocephalic and atraumatic.  Cardiovascular: Normal rate.   Respiratory: Effort normal.  GI: Soft. She exhibits no distension and no mass. There is no tenderness. There is no rebound and no guarding.  Genitourinary: Uterus is not enlarged and not tender. Cervix exhibits no motion tenderness, no discharge and no friability. Right adnexum displays no mass and no tenderness. Left adnexum displays no mass and no tenderness. No bleeding in the vagina. Vaginal discharge (small amount of thick, yellow discharge noted) found.  Neurological: She is alert and oriented to person, place, and time.  Skin: Skin is warm and dry. No erythema.  Psychiatric: She has a normal mood and affect.  Dilation: Closed Effacement (%): Thick Cervical Position: Posterior Exam by:: Magnus Sinning, PA-C   Results for orders placed or performed during the hospital encounter of 12/07/15 (from the past 24 hour(s))  Urinalysis, Routine w reflex microscopic (not at [facility])  Status: Abnormal   Collection Time: 12/07/15 11:20 PM  Result Value Ref Range   Color, Urine YELLOW YELLOW   APPearance CLEAR CLEAR   Specific Gravity, Urine 1.025 1.005 - 1.030   pH 6.0 5.0 - 8.0   Glucose, UA NEGATIVE NEGATIVE mg/dL   Hgb urine dipstick NEGATIVE NEGATIVE   Bilirubin Urine NEGATIVE NEGATIVE   Ketones, ur NEGATIVE NEGATIVE mg/dL   Protein, ur NEGATIVE NEGATIVE mg/dL   Nitrite NEGATIVE NEGATIVE   Leukocytes, UA SMALL (A) NEGATIVE   Urine microscopic-add on     Status: Abnormal   Collection Time: 12/07/15 11:20 PM  Result Value Ref Range   Squamous Epithelial / LPF 0-5 (A) NONE SEEN   WBC, UA 6-30 0 - 5 WBC/hpf   RBC / HPF 0-5 0 - 5 RBC/hpf   Bacteria, UA RARE (A) NONE SEEN   Urine-Other MUCOUS PRESENT   Pregnancy, urine POC     Status: Abnormal   Collection Time: 12/07/15 11:45 PM  Result Value Ref Range   Preg Test, Ur POSITIVE (A) NEGATIVE  Wet prep, genital     Status: Abnormal   Collection Time: 12/07/15 11:56 PM  Result Value Ref Range   Yeast Wet Prep HPF POC NONE SEEN NONE SEEN   Trich, Wet Prep NONE SEEN NONE SEEN   Clue Cells Wet Prep HPF POC NONE SEEN NONE SEEN   WBC, Wet Prep HPF POC MANY (A) NONE SEEN   Sperm NONE SEEN   CBC with Differential/Platelet     Status: Abnormal   Collection Time: 12/08/15 12:09 AM  Result Value Ref Range   WBC 8.3 4.0 - 10.5 K/uL   RBC 4.44 3.87 - 5.11 MIL/uL   Hemoglobin 12.7 12.0 - 15.0 g/dL   HCT 16.1 (L) 09.6 - 04.5 %   MCV 78.2 78.0 - 100.0 fL   MCH 28.6 26.0 - 34.0 pg   MCHC 36.6 (H) 30.0 - 36.0 g/dL   RDW 40.9 81.1 - 91.4 %   Platelets 303 150 - 400 K/uL   Neutrophils Relative % 46 %   Neutro Abs 3.8 1.7 - 7.7 K/uL   Lymphocytes Relative 44 %   Lymphs Abs 3.7 0.7 - 4.0 K/uL   Monocytes Relative 5 %   Monocytes Absolute 0.4 0.1 - 1.0 K/uL   Eosinophils Relative 4 %   Eosinophils Absolute 0.3 0.0 - 0.7 K/uL   Basophils Relative 1 %   Basophils Absolute 0.0 0.0 - 0.1 K/uL  hCG, quantitative, pregnancy     Status: Abnormal   Collection Time: 12/08/15 12:09 AM  Result Value Ref Range   hCG, Beta Chain, Quant, S 27570 (H) <5 mIU/mL   US Ob Comp Less 14 Wks  12/08/2015  CLINICAL DATA:  Vaginal bleeding before 22 weeks. Unknown last menstrual period. Cramping. G4 P2. History of cervical incompetence. EXAM: OBSTETRIC <14 WK [place] AND TRANSVAGINAL OB US TECHNIQUE: Both transabdominal and transvaginal ultrasound examinations were performed for complete  evaluation of the gestation as well as the maternal uterus, adnexal regions, and pelvic cul-de-sac. Transvaginal technique was performed to assess early pregnancy. COMPARISON:  None. FINDINGS: Intrauterine gestational sac: Present Yolk sac:  Present Embryo:  Present Cardiac Activity: Present Heart Rate: 120  bpm CRL:  3  mm   5 w   6 d                  [place] EDC: August 03, 2016 Subchorionic hemorrhage:  None visualized. Maternal uterus/adnexae: Normal appearance  of the adnexae a. 2.4 cm LEFT involuting cyst/dominant follicle. No free fluid. IMPRESSION: Single live intrauterine pregnancy, gestational age by ultrasound 5 weeks and 6 days without complication. Electronically Signed   By: Awilda Metroourtnay  Bloomer M.D.   On: 12/08/2015 00:52   [place] Ob Transvaginal  12/08/2015  CLINICAL DATA:  Vaginal bleeding before 22 weeks. Unknown last menstrual period. Cramping. G4 P2. History of cervical incompetence. EXAM: OBSTETRIC <14 WK US AND TRANSVAGINAL OB US TECHNIQUE: Both transabdominal and transvaginal ultrasound examinations were performed for complete evaluation of the gestation as well as the maternal uterus, adnexal regions, and pelvic cul-de-sac. Transvaginal technique was performed to assess early pregnancy. COMPARISON:  None. FINDINGS: Intrauterine gestational sac: Present Yolk sac:  Present Embryo:  Present Cardiac Activity: Present Heart Rate: 120  bpm CRL:  3  mm   5 w   6 d                  US EDC: August 03, 2016 Subchorionic hemorrhage:  None visualized. Maternal uterus/adnexae: Normal appearance of the adnexae a. 2.4 cm LEFT involuting cyst/dominant follicle. No free fluid. IMPRESSION: Single live intrauterine pregnancy, gestational age by ultrasound 5 weeks and 6 days without complication. Electronically Signed   By: Awilda Metroourtnay  Bloomer M.D.   On: 12/08/2015 00:52    MAU Course  Procedures None  MDM +UPT UA, wet prep, GC/chlamydia, CBC, quant hCG, HIV, RPR and US today to rule out ectopic pregnancy O+  blood type from previous visit in Epic Fioricet offered for headache after IUP confirmed - patient declines, would rather try Tylenol at home  Assessment and Plan  A: SIUP at [id number] Spotting in pregnancy, first trimester Headache  P: Discharge home First trimetser/bleeding precautions discussed Patient advised to follow-up with [facility] as planned to start prenatal care Pregnancy confirmation letter given  Advised Tylenol PRN for pain GC/Chlamydia pending Patient may return to MAU as needed or if her condition were to change or worsen   Pamela LowensteinJulie N Elias Bordner, PA-C  12/08/2015, 2:11 AM

## 2015-12-07 NOTE — MAU Note (Signed)
Pt presents complaining of a headache. Sometimes has lower abdominal pain but none now. Denies bleeding at this time. Pt also having a brown discharge. LMP unknown

## 2015-12-08 ENCOUNTER — Inpatient Hospital Stay (HOSPITAL_COMMUNITY): Payer: Medicaid Other

## 2015-12-08 ENCOUNTER — Encounter (HOSPITAL_COMMUNITY): Payer: Self-pay

## 2015-12-08 DIAGNOSIS — O209 Hemorrhage in early pregnancy, unspecified: Secondary | ICD-10-CM

## 2015-12-08 LAB — CBC WITH DIFFERENTIAL/PLATELET
Basophils Absolute: 0 10*3/uL (ref 0.0–0.1)
Basophils Relative: 1 %
EOS PCT: 4 %
Eosinophils Absolute: 0.3 10*3/uL (ref 0.0–0.7)
HCT: 34.7 % — ABNORMAL LOW (ref 36.0–46.0)
Hemoglobin: 12.7 g/dL (ref 12.0–15.0)
LYMPHS ABS: 3.7 10*3/uL (ref 0.7–4.0)
LYMPHS PCT: 44 %
MCH: 28.6 pg (ref 26.0–34.0)
MCHC: 36.6 g/dL — AB (ref 30.0–36.0)
MCV: 78.2 fL (ref 78.0–100.0)
MONO ABS: 0.4 10*3/uL (ref 0.1–1.0)
Monocytes Relative: 5 %
Neutro Abs: 3.8 10*3/uL (ref 1.7–7.7)
Neutrophils Relative %: 46 %
PLATELETS: 303 10*3/uL (ref 150–400)
RBC: 4.44 MIL/uL (ref 3.87–5.11)
RDW: 13.4 % (ref 11.5–15.5)
WBC: 8.3 10*3/uL (ref 4.0–10.5)

## 2015-12-08 LAB — URINALYSIS, ROUTINE W REFLEX MICROSCOPIC
Bilirubin Urine: NEGATIVE
GLUCOSE, UA: NEGATIVE mg/dL
HGB URINE DIPSTICK: NEGATIVE
Ketones, ur: NEGATIVE mg/dL
Nitrite: NEGATIVE
Protein, ur: NEGATIVE mg/dL
Specific Gravity, Urine: 1.025 (ref 1.005–1.030)
pH: 6 (ref 5.0–8.0)

## 2015-12-08 LAB — HIV ANTIBODY (ROUTINE TESTING W REFLEX): HIV SCREEN 4TH GENERATION: NONREACTIVE

## 2015-12-08 LAB — URINE MICROSCOPIC-ADD ON

## 2015-12-08 LAB — WET PREP, GENITAL
Clue Cells Wet Prep HPF POC: NONE SEEN
Sperm: NONE SEEN
Trich, Wet Prep: NONE SEEN
Yeast Wet Prep HPF POC: NONE SEEN

## 2015-12-08 LAB — HCG, QUANTITATIVE, PREGNANCY: hCG, Beta Chain, Quant, S: 27570 m[IU]/mL — ABNORMAL HIGH (ref <5)

## 2015-12-08 LAB — GC/CHLAMYDIA PROBE AMP (~~LOC~~) NOT AT ARMC
CHLAMYDIA, DNA PROBE: POSITIVE — AB
NEISSERIA GONORRHEA: NEGATIVE

## 2015-12-08 LAB — RPR: RPR Ser Ql: NONREACTIVE

## 2015-12-08 MED ORDER — BUTALBITAL-APAP-CAFFEINE 50-325-40 MG PO TABS: 2.0000 | ORAL_TABLET | Freq: Once | ORAL | Status: DC

## 2015-12-08 NOTE — Discharge Instructions (Signed)
Pelvic Rest Pelvic rest is sometimes recommended for women when:   The placenta is partially or completely covering the opening of the cervix (placenta previa).  There is bleeding between the uterine wall and the amniotic sac in the first trimester (subchorionic hemorrhage).  The cervix begins to open without labor starting (incompetent cervix, cervical insufficiency).  The labor is too early (preterm labor). HOME CARE INSTRUCTIONS  Do not have sexual intercourse, stimulation, or an orgasm.  Do not use tampons, douche, or put anything in the vagina.  Do not lift anything over 10 pounds (4.5 kg).  Avoid strenuous activity or straining your pelvic muscles. SEEK MEDICAL CARE IF:  You have any vaginal bleeding during pregnancy. Treat this as a potential emergency.  You have cramping pain felt low in the stomach (stronger than menstrual cramps).  You notice vaginal discharge (watery, mucus, or bloody).  You have a low, dull backache.  There are regular contractions or uterine tightening. SEEK IMMEDIATE MEDICAL CARE IF: You have vaginal bleeding and have placenta previa.    This information is not intended to replace advice given to you by your health care provider. Make sure you discuss any questions you have with your health care provider.   Document Released: 10/27/2010 Document Revised: 09/24/2011 Document Reviewed: 01/03/2015 Elsevier Interactive Patient Education 2016 Elsevier Inc.  Vaginal Bleeding During Pregnancy, First Trimester A small amount of bleeding (spotting) from the vagina is common in early pregnancy. Sometimes the bleeding is normal and is not a problem, and sometimes it is a sign of something serious. Be sure to tell your doctor about any bleeding from your vagina right away. HOME CARE  Watch your condition for any changes.  Follow your doctor's instructions about how active you can be.  If you are on bed rest:  You may need to stay in bed and only  get up to use the bathroom.  You may be allowed to do some activities.  If you need help, make plans for someone to help you.  Write down:  The number of pads you use each day.  How often you change pads.  How soaked (saturated) your pads are.  Do not use tampons.  Do not douche.  Do not have sex or orgasms until your doctor says it is okay.  If you pass any tissue from your vagina, save the tissue so you can show it to your doctor.  Only take medicines as told by your doctor.  Do not take aspirin because it can make you bleed.  Keep all follow-up visits as told by your doctor. GET HELP IF:   You bleed from your vagina.  You have cramps.  You have labor pains.  You have a fever that does not go away after you take medicine. GET HELP RIGHT AWAY IF:   You have very bad cramps in your back or belly (abdomen).  You pass large clots or tissue from your vagina.  You bleed more.  You feel light-headed or weak.  You pass out (faint).  You have chills.  You are leaking fluid or have a gush of fluid from your vagina.  You pass out while pooping (having a bowel movement). MAKE SURE YOU:  Understand these instructions.  Will watch your condition.  Will get help right away if you are not doing well or get worse.   This information is not intended to replace advice given to you by your health care provider. Make sure you discuss any questions  you have with your health care provider.   Document Released: 11/16/2013 Document Reviewed: 11/16/2013 Elsevier Interactive Patient Education [employer] First Trimester of Pregnancy The first trimester of pregnancy is from week 1 until the end of week 12 (months 1 through 3). During this time, your baby will begin to develop inside you. At 6-8 weeks, the eyes and face are formed, and the heartbeat can be seen on ultrasound. At the end of 12 weeks, all the baby's organs are formed. Prenatal care is all the medical care  you receive before the birth of your baby. Make sure you get good prenatal care and follow all of your doctor's instructions. HOME CARE  Medicines  Take medicine only as told by your doctor. Some medicines are safe and some are not during pregnancy.  Take your prenatal vitamins as told by your doctor.  Take medicine that helps you poop (stool softener) as needed if your doctor says it is okay. Diet  Eat regular, healthy meals.  Your doctor will tell you the amount of weight gain that is right for you.  Avoid raw meat and uncooked cheese.  If you feel sick to your stomach (nauseous) or throw up (vomit):  Eat 4 or 5 small meals a day instead of 3 large meals.  Try eating a few soda crackers.  Drink liquids between meals instead of during meals.  If you have a hard time pooping (constipation):  Eat high-fiber foods like fresh vegetables, fruit, and whole grains.  Drink enough fluids to keep your pee (urine) clear or pale yellow. Activity and Exercise  Exercise only as told by your doctor. Stop exercising if you have cramps or pain in your lower belly (abdomen) or low back.  Try to avoid standing for long periods of time. Move your legs often if you must stand in one place for a long time.  Avoid heavy lifting.  Wear low-heeled shoes. Sit and stand up straight.  You can have sex unless your doctor tells you not to. Relief of Pain or Discomfort  Wear a good support bra if your breasts are sore.  Take warm water baths (sitz baths) to soothe pain or discomfort caused by hemorrhoids. Use hemorrhoid cream if your doctor says it is okay.  Rest with your legs raised if you have leg cramps or low back pain.  Wear support hose if you have puffy, bulging veins (varicose veins) in your legs. Raise (elevate) your feet for 15 minutes, 3-4 times a day. Limit salt in your diet. Prenatal Care  Schedule your prenatal visits by the twelfth week of pregnancy.  Write down your  questions. Take them to your prenatal visits.  Keep all your prenatal visits as told by your doctor. Safety  Wear your seat belt at all times when driving.  Make a list of emergency phone numbers. The list should include numbers for family, friends, the hospital, and police and fire departments. General Tips  Ask your doctor for a referral to a local prenatal class. Begin classes no later than at the start of month 6 of your pregnancy.  Ask for help if you need counseling or help with nutrition. Your doctor can give you advice or tell you where to go for help.  Do not use hot tubs, steam rooms, or saunas.  Do not douche or use tampons or scented sanitary pads.  Do not cross your legs for long periods of time.  Avoid litter boxes and soil used by cats.  Avoid all smoking, herbs, and alcohol. Avoid drugs not approved by your doctor.  Do not use any tobacco products, including cigarettes, chewing tobacco, and electronic cigarettes. If you need help quitting, ask your doctor. You may get counseling or other support to help you quit.  Visit your dentist. At home, brush your teeth with a soft toothbrush. Be gentle when you floss. GET HELP IF:  You are dizzy.  You have mild cramps or pressure in your lower belly.  You have a nagging pain in your belly area.  You continue to feel sick to your stomach, throw up, or have watery poop (diarrhea).  You have a bad smelling fluid coming from your vagina.  You have pain with peeing (urination).  You have increased puffiness (swelling) in your face, hands, legs, or ankles. GET HELP RIGHT AWAY IF:   You have a fever.  You are leaking fluid from your vagina.  You have spotting or bleeding from your vagina.  You have very bad belly cramping or pain.  You gain or lose weight rapidly.  You throw up blood. It may look like coffee grounds.  You are around people who have [place] measles, fifth disease, or chickenpox.  You have a very  bad headache.  You have shortness of breath.  You have any kind of trauma, such as from a fall or a car accident.   This information is not intended to replace advice given to you by your health care provider. Make sure you discuss any questions you have with your health care provider.   Document Released: 12/19/2007 Document Revised: 07/23/2014 Document Reviewed: 05/12/2013 Elsevier Interactive Patient Education [employer]2016 Elsevier Inc.

## 2015-12-13 ENCOUNTER — Encounter (HOSPITAL_COMMUNITY): Payer: Self-pay | Admitting: *Deleted

## 2015-12-13 ENCOUNTER — Telehealth (HOSPITAL_COMMUNITY): Payer: Self-pay | Admitting: *Deleted

## 2015-12-13 DIAGNOSIS — A749 Chlamydial infection, unspecified: Secondary | ICD-10-CM

## 2015-12-13 MED ORDER — AZITHROMYCIN 500 MG PO TABS: ORAL_TABLET | ORAL | Status: DC

## 2015-12-13 NOTE — Telephone Encounter (Signed)
Telephone call to patient regarding positive chlamydia culture, patient notified.  Rx routed to pharmacy per protocol.  Instructed patient to notify her partner for treatment and to abstain from sex for seven days post treatment.  Report faxed to health department.  

## 2016-05-19 ENCOUNTER — Encounter (HOSPITAL_COMMUNITY): Payer: Self-pay | Admitting: Emergency Medicine

## 2016-05-19 ENCOUNTER — Emergency Department (HOSPITAL_COMMUNITY)
Admission: EM | Admit: 2016-05-19 | Discharge: 2016-05-19 | Disposition: A | Discharge status: Home / Self Care | Payer: Medicaid Other | Attending: Emergency Medicine | Admitting: Emergency Medicine

## 2016-05-19 DIAGNOSIS — N898 Other specified noninflammatory disorders of vagina: Secondary | ICD-10-CM

## 2016-05-19 DIAGNOSIS — N889 Noninflammatory disorder of cervix uteri, unspecified: Secondary | ICD-10-CM

## 2016-05-19 DIAGNOSIS — R87619 Unspecified abnormal cytological findings in specimens from cervix uteri: Secondary | ICD-10-CM | POA: Insufficient documentation

## 2016-05-19 DIAGNOSIS — F17210 Nicotine dependence, cigarettes, uncomplicated: Secondary | ICD-10-CM | POA: Insufficient documentation

## 2016-05-19 DIAGNOSIS — A599 Trichomoniasis, unspecified: Secondary | ICD-10-CM | POA: Insufficient documentation

## 2016-05-19 LAB — URINALYSIS, ROUTINE W REFLEX MICROSCOPIC
Bilirubin Urine: NEGATIVE
GLUCOSE, UA: NEGATIVE mg/dL
KETONES UR: NEGATIVE mg/dL
Nitrite: NEGATIVE
PROTEIN: NEGATIVE mg/dL
Specific Gravity, Urine: 1.022 (ref 1.005–1.030)
pH: 7 (ref 5.0–8.0)

## 2016-05-19 LAB — I-STAT BETA HCG BLOOD, ED (MC, WL, AP ONLY): I-stat hCG, quantitative: <5 m[IU]/mL (ref <5)

## 2016-05-19 LAB — URINE MICROSCOPIC-ADD ON

## 2016-05-19 LAB — RPR: RPR Ser Ql: NONREACTIVE

## 2016-05-19 LAB — WET PREP, GENITAL
CLUE CELLS WET PREP: NONE SEEN
SPERM: NONE SEEN
YEAST WET PREP: NONE SEEN

## 2016-05-19 LAB — HIV ANTIBODY (ROUTINE TESTING W REFLEX): HIV Screen 4th Generation wRfx: NONREACTIVE

## 2016-05-19 MED ORDER — LIDOCAINE VISCOUS 2 % MT SOLN
20.0000 mL | Freq: Once | OROMUCOSAL | Status: AC
  Administered 2016-05-19: 20 mL via OROMUCOSAL
  Filled 2016-05-19: qty 30

## 2016-05-19 MED ORDER — METRONIDAZOLE 500 MG PO TABS
2000.0000 mg | ORAL_TABLET | Freq: Once | ORAL | Status: AC
  Administered 2016-05-19: 2000 mg via ORAL
  Filled 2016-05-19: qty 4

## 2016-05-19 MED ORDER — IBUPROFEN 400 MG PO TABS
400.0000 mg | ORAL_TABLET | Freq: Once | ORAL | Status: AC
  Administered 2016-05-19: 400 mg via ORAL
  Filled 2016-05-19: qty 1

## 2016-05-19 NOTE — ED Notes (Signed)
Urine collected, labeled and placed at bedside

## 2016-05-19 NOTE — ED Triage Notes (Signed)
Pt presents to ER from home with vaginal burning x 3 days, worse with use of underwear, walking, wiping, etc.; pt also reports a vaginal discharge, denies an odor; pt denies dysuria

## 2016-05-19 NOTE — Discharge Instructions (Signed)
You were not tested for all STDs today. Your gonorrhea and chlamydia tests are pending- if they are positive, you will receive a phone call. Refrain from sex until you have the results from a full STD screen.  Your partner will need treatment for the trichomoniasis.  It is very important that you [facility] for evaluation of your abnormal cervix and possible Pap smear.

## 2016-05-19 NOTE — ED Provider Notes (Signed)
[facility]-EMERGENCY DEPT Provider Note   CSN: [id number] Arrival date & time: 05/19/16  0607     History   Chief Complaint Chief Complaint  Patient presents with  . Vaginal Itching  . Vaginal Discharge    HPI  Blood pressure 95/76, pulse 95, temperature 98.4 F (36.9 C), temperature source Oral, resp. rate 16, last menstrual period 05/01/2016, SpO2 100 %, unknown if currently breastfeeding.  Pamela Curtis is a 20 y.o. female complaining of Painful burning sensation to perennial area onset 3 days ago, states it's exacerbated by palpation and walking. No rash that she noticed. She notes abnormal vaginal discharge but can't really describe it. She denies fever or chills. She has had unprotected sex with single partner, does not follow regularly with primary care or OB/GYN. States that her last Pap smear was about 2 years ago when she had her child. No dysuria or urinary frequency, states that it only hurts after she wipes only hurts when there is physical irritation of the perineum   Past Medical History:  Diagnosis Date  . Cervical incompetence with baby delivered in second trimester 12/04/2012   Needs cerclage and close follow up with subsequent pregnancies   . [id number](478.2)     Patient Active Problem List   Diagnosis Date Noted  . Active labor at term 01/05/2014  . Prior pregnancy complicated by IUGR, antepartum 10/29/2013  . Incompetent cervix in pregnancy, antepartum 07/30/2013  . 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 Supervision of high risk pregnancy in third trimester 05/21/2013    Past Surgical History:  Procedure Laterality Date  . CERVICAL CERCLAGE N/A 08/03/2013   Procedure: CERCLAGE CERVICAL;  Surgeon: Pamela Bores, MD;  Location: [facility];  Service: Gynecology;  Laterality: N/A;  . DILATION AND EVACUATION N/A 12/04/2012   Procedure: DILATATION AND EVACUATION;  Surgeon: Pamela Newcomer, MD;  Location: [facility];  Service: Gynecology;  Laterality: N/A;  . NO PAST  SURGERIES      OB History    Gravida Para Term Preterm AB Living   4 2 2   1 2    SAB TAB Ectopic Multiple Live Births   1       2       Home Medications    Prior to Admission medications   Medication Sig Start Date End Date Taking? Authorizing Provider  azithromycin (ZITHROMAX) 500 MG tablet Take two tablets by mouth once. Patient not taking: Reported on 05/19/2016 12/13/15   Pamela Antigua, MD    Family History History reviewed. No pertinent family history.  Social History Social History  Substance Use Topics  . Smoking status: Current Every Day Smoker    Packs/day: 0.25    Types: Cigarettes  . Smokeless tobacco: Never Used  . Alcohol use No     Comment: ocassionally, not while pregnant     Allergies   Review of patient's allergies indicates no known allergies.   Review of Systems Review of Systems  10 systems reviewed and found to be negative, except as noted in the HPI.  Physical Exam Updated Vital Signs BP 112/71 (BP Location: Right Arm)   Pulse 88   Temp 98.2 F (36.8 C) (Oral)   Resp 16   LMP 05/01/2016   SpO2 100%   Breastfeeding? Unknown   Physical Exam  Constitutional: She is oriented to person, place, and time. She appears well-developed and well-nourished. No distress.  HENT:  Head: Normocephalic and atraumatic.  Mouth/Throat: Oropharynx is clear and moist.  Eyes: Conjunctivae and EOM are normal. Pupils are equal, round, and reactive to light.  Neck: Normal range of motion.  Cardiovascular: Normal rate, regular rhythm and intact distal pulses.   Pulmonary/Chest: Effort normal and breath sounds normal.  Abdominal: Soft. There is no tenderness.  Genitourinary:  Genitourinary Comments: Pelvic exam is chaperoned by RN:  No rashes or lesions, no abnormal vaginal discharge. Cervix friable  Musculoskeletal: Normal range of motion.  Neurological: She is alert and oriented to person, place, and time.  Skin: She is not diaphoretic.  Psychiatric:  She has a normal mood and affect.  Nursing note and vitals reviewed.    ED Treatments / Results  Labs (all labs ordered are listed, but only abnormal results are displayed) Labs Reviewed  WET PREP, GENITAL - Abnormal; Notable for the following:       Result Value   Trich, Wet Prep PRESENT (*)    WBC, Wet Prep HPF POC MANY (*)    All other components within normal limits  URINALYSIS, ROUTINE W REFLEX MICROSCOPIC (NOT AT [facility]) - Abnormal; Notable for the following:    Hgb urine dipstick MODERATE (*)    Leukocytes, UA SMALL (*)    All other components within normal limits  URINE MICROSCOPIC-ADD ON - Abnormal; Notable for the following:    Squamous Epithelial / LPF 0-5 (*)    Bacteria, UA RARE (*)    All other components within normal limits  RPR  HIV ANTIBODY (ROUTINE TESTING)  I-STAT BETA HCG BLOOD, ED ([facility], [facility], [facility] ONLY)  GC/CHLAMYDIA PROBE AMP ([place]) NOT AT [facility]    EKG  EKG Interpretation None       Radiology No results found.  Procedures Procedures (including critical care time)  Medications Ordered in ED Medications  ibuprofen (ADVIL,MOTRIN) tablet 400 mg (400 mg Oral Given 05/19/16 0736)  lidocaine (XYLOCAINE) 2 % viscous mouth solution 20 mL (20 mLs Mouth/Throat Given 05/19/16 0736)  metroNIDAZOLE (FLAGYL) tablet 2,000 mg (2,000 mg Oral Given 05/19/16 0944)     Initial Impression / Assessment and Plan / ED Course  I have reviewed the triage vital signs and the nursing notes.  Pertinent labs & imaging results that were available during my care of the patient were reviewed by me and considered in my medical decision making (see chart for details).  Clinical Course    Vitals:   05/19/16 0715 05/19/16 0800 05/19/16 0915 05/19/16 0945  BP: 116/79 106/65 104/64 112/71  Pulse: 93 78 80 88  Resp: 16 16  16   Temp:    98.2 F (36.8 C)  TempSrc:    Oral  SpO2: 100% 100% 100% 100%    Medications  ibuprofen (ADVIL,MOTRIN) tablet 400 mg (400 mg Oral Given  05/19/16 0736)  lidocaine (XYLOCAINE) 2 % viscous mouth solution 20 mL (20 mLs Mouth/Throat Given 05/19/16 0736)  metroNIDAZOLE (FLAGYL) tablet 2,000 mg (2,000 mg Oral Given 05/19/16 0944)    Pamela Curtis is 20 y.o. female presenting with Painful perennial and vaginal discharge onset several days ago. Physical exam with no rash or lesions consistent with herpes, I would think that after this many days it would be lesions present if that was the source of her discomfort. Pelvic exam with irregular cervix, friable lesions. Last Pap was 2 years ago, advised her she needs follow closely with OB/GYN on this. No cervical motion or adnexal tenderness. Wet prep with trichomoniasis, will treat with 2 g Flagyl. Patient is advised to take ibuprofen and will  write her prescription for viscous lidocaine for discomfort. Work note provided.  Evaluation does not show pathology that would require ongoing emergent intervention or inpatient treatment. Pt is hemodynamically stable and mentating appropriately. Discussed findings and plan with patient/guardian, who agrees with care plan. All questions answered. Return precautions discussed and outpatient follow up given.   Final Clinical Impressions(s) / ED Diagnoses   Final diagnoses:  Trichimoniasis  Vaginal irritation  Abnormal appearance of cervix    New Prescriptions Discharge Medication List as of 05/19/2016  9:30 AM       Wynetta EmeryNicole Teriana Danker, PA-C 05/19/16 1224    Arby BarretteMarcy Pfeiffer, MD 05/19/16 1226

## 2016-05-19 NOTE — ED Notes (Signed)
Pt states she understand instructions and will follow up as directed. Home stable with steady gait.

## 2016-05-19 NOTE — ED Notes (Signed)
RN gave pts child apple juice, graham crackers, and peanut butter

## 2016-05-21 LAB — GC/CHLAMYDIA PROBE AMP (~~LOC~~) NOT AT ARMC
Chlamydia: NEGATIVE
NEISSERIA GONORRHEA: NEGATIVE

## 2016-05-23 ENCOUNTER — Telehealth: Payer: Self-pay | Admitting: Family Medicine

## 2016-05-23 NOTE — Telephone Encounter (Signed)
Patient called to make an appointment where [facility]. She stated she needed to be seen this week. I informed her I didn't have any appointments this week. I called [facility] to see if they could work her in this week. Spoke to [place] Bet[place], Passenger transport manageregistrar. She stated the patient was pregnant. I told her I saw that, but the patient says she is not pregnanet. I informed the patient that they didn't have any appointments either,  But I would call her if something came open sooner. Again I asked her was she pregnant. She stated she was not. Then I received a call from [place] stating she was insure pregnant, and has an EDD of 08/03/2016 according to the [place] report. I called the patient and asked her again about being pregnant, and having a due date. I asked her to come in to do a pregnancy test. Patient stated she would do one.

## 2016-05-24 ENCOUNTER — Ambulatory Visit: Payer: Medicaid Other | Admitting: *Deleted

## 2016-05-24 DIAGNOSIS — Z3202 Encounter for pregnancy test, result negative: Secondary | ICD-10-CM

## 2016-05-24 NOTE — Progress Notes (Signed)
Pt in for pregnancy test. Results are negative. Reports that she had a TAB earlier this year. Per ED notes patients cervix was very friable and she had suspicious lesions. Pt scheduled to come back in for pap smear.

## 2016-05-25 LAB — POCT PREGNANCY, URINE: Preg Test, Ur: NEGATIVE

## 2016-06-06 ENCOUNTER — Encounter: Payer: Self-pay | Admitting: Obstetrics & Gynecology

## 2016-06-06 ENCOUNTER — Other Ambulatory Visit (HOSPITAL_COMMUNITY)
Admission: RE | Admit: 2016-06-06 | Discharge: 2016-06-06 | Disposition: A | Discharge status: Home / Self Care | Payer: Medicaid Other | Source: Ambulatory Visit | Attending: Family Medicine | Admitting: Family Medicine

## 2016-06-06 ENCOUNTER — Ambulatory Visit (INDEPENDENT_AMBULATORY_CARE_PROVIDER_SITE_OTHER): Payer: Medicaid Other | Admitting: Family Medicine

## 2016-06-06 VITALS — BP 109/72 | HR 101 | Wt 186.0 lb

## 2016-06-06 DIAGNOSIS — Z01419 Encounter for gynecological examination (general) (routine) without abnormal findings: Secondary | ICD-10-CM | POA: Insufficient documentation

## 2016-06-06 DIAGNOSIS — Z113 Encounter for screening for infections with a predominantly sexual mode of transmission: Secondary | ICD-10-CM | POA: Insufficient documentation

## 2016-06-06 DIAGNOSIS — Z3009 Encounter for other general counseling and advice on contraception: Secondary | ICD-10-CM | POA: Diagnosis not present

## 2016-06-06 NOTE — Patient Instructions (Signed)
HPV (Human Papillomavirus) Vaccine: What You Need to Know 1. Why get vaccinated? HPV vaccine prevents infection with human papillomavirus (HPV) types that are associated with many cancers, including:  cervical cancer in females,  vaginal and vulvar cancers in females,  anal cancer in females and males,  throat cancer in females and males, and  penile cancer in males. In addition, HPV vaccine prevents infection with HPV types that cause genital warts in both females and males. In the [place] about 12,000 women get cervical cancer every year, and about 4,000 women die from it. HPV vaccine can prevent most of these cases of cervical cancer. Vaccination is not a substitute for cervical cancer screening. This vaccine does not protect against all HPV types that can cause cervical cancer. Women should still get regular Pap tests.  HPV infection usually comes from sexual contact, and most people will become infected at some point in their life. About 14 million Americans, including teens, get infected every year. Most infections will go away on their own and not cause serious problems. But thousands of women and men get cancer and other diseases from HPV. 2. HPV vaccine HPV vaccine is approved by FDA and is recommended by CDC for both males and females. It is routinely given at 56 or 22 years of age, but it may be given beginning at age 27 years through age 71 years. Most adolescents 9 through 22 years of age should get HPV vaccine as a two-dose series with the doses separated by 6-12 months. People who start HPV vaccination at 68 years of age and older should get the vaccine as a three-dose series with the second dose given 1-2 months after the first dose and the third dose given 6 months after the first dose. There are several exceptions to these age recommendations. Your health care provider can give you more information. 3. Some people should not get this vaccine  Anyone who has had a severe  (life-threatening) allergic reaction to a dose of HPV vaccine should not get another dose.  Anyone who has a severe (life threatening) allergy to any component of HPV vaccine should not get the vaccine.  Tell your doctor if you have any severe allergies that you know of, including a severe allergy to yeast.  HPV vaccine is not recommended for pregnant women. If you learn that you were pregnant when you were vaccinated, there is no reason to expect any problems for you or your baby. Any woman who learns she was pregnant when she got HPV vaccine is encouraged to contact the manufacturer's registry for HPV vaccination during pregnancy at 8320885769. Women who are breastfeeding may be vaccinated.  If you have a mild illness, such as a cold, you can probably get the vaccine today. If you are moderately or severely ill, you should probably wait until you recover. Your doctor can advise you. 4. Risks of a vaccine reaction With any medicine, including vaccines, there is a chance of side effects. These are usually mild and go away on their own, but serious reactions are also possible. Most people who get HPV vaccine do not have any serious problems with it. Mild or moderate problems following HPV vaccine:  Reactions in the arm where the shot was given:  Soreness (about 9 people in 10)  Redness or swelling (about 1 person in 3)  Fever:  Mild (100F) (about 1 person in 10)  Moderate (102F) (about 1 person in 76)  Other problems:  Headache (about 1 person  in 3) Problems that could happen after any injected vaccine:  People sometimes faint after a medical procedure, including vaccination. Sitting or lying down for about 15 minutes can help prevent fainting, and injuries caused by a fall. Tell your doctor if you feel dizzy, or have vision changes or ringing in the ears.  Some people get severe pain in the shoulder and have difficulty moving the arm where a shot was given. This happens very  rarely.  Any medication can cause a severe allergic reaction. Such reactions from a vaccine are very rare, estimated at about 1 in a million doses, and would happen within a few minutes to a few hours after the vaccination. As with any medicine, there is a very remote chance of a vaccine causing a serious injury or death. The safety of vaccines is always being monitored. For more information, visit: http://floyd.org/www.cdc.gov/vaccinesafety/. 5. What if there is a serious reaction? What should I look for? Look for anything that concerns you, such as signs of a severe allergic reaction, very high fever, or unusual behavior. Signs of a severe allergic reaction can include hives, swelling of the face and throat, difficulty breathing, a fast heartbeat, dizziness, and weakness. These would usually start a few minutes to a few hours after the vaccination. What should I do? If you think it is a severe allergic reaction or other emergency that can't wait, call 9-1-1 or get to the nearest hospital. Otherwise, call your doctor. Afterward, the reaction should be reported to the Vaccine Adverse Event Reporting System (VAERS). Your doctor should file this report, or you can do it yourself through the VAERS web site at www.vaers.LAgents.nohhs.gov, or by calling 1-(562) 651-2030. VAERS does not give medical advice. 6. The National Vaccine Injury Compensation Program The [employer] Vaccine Injury Compensation Program (VICP) is a federal program that was created to compensate people who may have been injured by certain vaccines. Persons who believe they may have been injured by a vaccine can learn about the program and about filing a claim by calling 1-502-633-7269 or visiting the VICP website at SpiritualWord.atwww.hrsa.gov/vaccinecompensation. There is a time limit to file a claim for compensation. 7. How can I learn more?  Ask your health care provider. He or she can give you the vaccine package insert or suggest other sources of information.  Call your  local or state health department.  Contact the Centers for Disease Control and Prevention (CDC):  Call 667-178-84061-(251) 415-1475 (1-800-CDC-INFO) or  Visit CDC's website at RunningConvention.dewww.cdc.gov/hpv Vaccine Information Statement, HPV Vaccine (06/17/2015) This information is not intended to replace advice given to you by your health care provider. Make sure you discuss any questions you have with your health care provider. Document Released: 01/27/2014 Document Revised: 03/22/2016 Document Reviewed: 03/22/2016 Elsevier Interactive Patient Education  2017 Elsevier Inc.   Pap Test Why am I having this test? A pap test is sometimes called a pap smear. It is a screening test that is used to check for signs of cancer of the vagina, cervix, and uterus. The test can also identify the presence of infection or precancerous changes. Your health care provider will likely recommend you have this test done on a regular basis. This test may be done:  Every 3 years, starting at age 22.  Every 5 years, in combination with testing for the presence of human papillomavirus (HPV).  More or less often depending on other medical conditions. What kind of sample is taken? Using a small cotton swab, plastic spatula, or brush, your health  care provider will collect a sample of cells from the surface of your cervix. Your cervix is the opening to your uterus, also called a womb. Secretions from the cervix and vagina may also be collected. How do I prepare for this test?  Be aware of where you are in your menstrual cycle. You may be asked to reschedule the test if you are menstruating on the day of the test.  You may need to reschedule if you have a known vaginal infection on the day of the test.  You may be asked to avoid douching or taking a bath the day before or the day of the test.  Some medicines can cause abnormal test results, such as digitalis and tetracycline. Talk with your health care provider before your test if you take  one of these medicines. What do the results mean? Abnormal test results may indicate a number of health conditions. These may include:  Cancer. Although pap test results cannot be used to diagnose cancer of the cervix, vagina, or uterus, they may suggest the possibility of cancer. Further tests would be required to determine if cancer is present.  Sexually transmitted disease.  Fungal infection.  Parasite infection.  Herpes infection.  A condition causing or contributing to infertility. It is your responsibility to obtain your test results. Ask the lab or department performing the test when and how you will get your results. Contact your health care provider to discuss any questions you have about your results. Talk with your health care provider to discuss your results, treatment options, and if necessary, the need for more tests. Talk with your health care provider if you have any questions about your results. This information is not intended to replace advice given to you by your health care provider. Make sure you discuss any questions you have with your health care provider. Document Released: 09/22/2002 Document Revised: 03/07/2016 Document Reviewed: 11/23/2013 Elsevier Interactive Patient Education  2017 [employer]

## 2016-06-06 NOTE — Progress Notes (Signed)
   CLINIC ENCOUNTER NOTE  History:  20 y.o. [id number] here today for pap smear.  This ia patient's first pap smear. Currently sexually active. Not using condoms. Not on birth control. Last intercourse was 2-3 days ago unprotected. Desires to be on birth control. Recent ED  Visit on 05/19/16 with trichomonas, s/p treatment. No abnormal bleeding since that visit. Has been on birth control in past, tried Depo (x1)- had abnormal VB with it for entire 2 months, therefore does not want to do it again. Had IUD, came out on own while using tampon, came out behind tampon. Was in for about 1-2 months.   LMP: about 05/21/16. Has regular monthly periods. Lasts about 5 days. Moderate bleeding, with no clots or soaking pads every hour. No significant cramping.   2 SAB 2 SVD   Past Medical History:  Diagnosis Date  . Cervical incompetence with baby delivered in second trimester 12/04/2012   Needs cerclage and close follow up with subsequent pregnancies   . AVWUJWJX(914.7Headache(784.0)     Past Surgical History:  Procedure Laterality Date  . CERVICAL CERCLAGE N/A 08/03/2013   Procedure: CERCLAGE CERVICAL;  Surgeon: Reva Boresanya S [name], MD;  Location: [facility];  Service: Gynecology;  Laterality: N/A;  . DILATION AND EVACUATION N/A 12/04/2012   Procedure: DILATATION AND EVACUATION;  Surgeon: Tereso NewcomerUgonna A Anyanwu, MD;  Location: [facility];  Service: Gynecology;  Laterality: N/A;  . NO PAST SURGERIES      The following portions of the patient's history were reviewed and updated as appropriate: allergies, current medications, past family history, past medical history, past social history, past surgical history and problem list.    Review of Systems:  See above; comprehensive review of systems was otherwise negative.   Objective:  Physical Exam BP 109/72   Pulse (!) 101   Wt 186 lb (84.4 kg)   LMP 05/01/2016   BMI 30.95 kg/m  CONSTITUTIONAL: Well-developed, well-nourished female in no acute distress.  HENT:  Normocephalic,  atraumatic SKIN: Skin is warm and dry.  BREAST: Symmetrical bilaterally. No LAD.  NEUROLGIC: Alert  PSYCHIATRIC: Normal mood and affect.  CARDIOVASCULAR: Normal heart rate noted RESPIRATORY: Effort and breath sounds normal, no problems with respiration noted ABDOMEN: Soft, no distention noted.  No tenderness, rebound or guarding.  PELVIC:  Normal appearing external genitalia; normal appearing vaginal mucosa and cervix although slightly friable cervix.  Normal appearing discharge.  Normal uterine size, no other palpable masses, no uterine or adnexal tenderness.   Labs and Imaging No results found.  Assessment & Plan:   1. Well woman exam with routine gynecological exam - Cytology - PAP  2. Contraceptive education - Desires IUD, but recent intercourse unprotected 2-3 days ago, return in 2 weeks for UPT and for IUD placement. Patient educated to abstain from unprotected sex until then   Routine preventative health maintenance measures emphasized.     Jen MowElizabeth Deaven Urwin, DO OB/GYN Fellow [facility], [facility]

## 2016-06-11 LAB — CYTOLOGY - PAP
Diagnosis: NEGATIVE
Trichomonas: NEGATIVE

## 2016-06-13 ENCOUNTER — Telehealth: Payer: Self-pay

## 2016-06-13 NOTE — Telephone Encounter (Signed)
Call patient no answer Patients pap smear results was normal. She will need her next one in three years.

## 2016-07-02 ENCOUNTER — Ambulatory Visit: Payer: Medicaid Other | Admitting: Obstetrics & Gynecology

## 2016-07-03 ENCOUNTER — Ambulatory Visit: Payer: Medicaid Other | Admitting: Family Medicine

## 2016-07-16 NOTE — L&D Delivery Note (Signed)
Delivery Note At 9:02 PM a viable female was delivered via Vaginal, Spontaneous Delivery (Presentation:ROA).  APGAR: 9, 9; weight 2800g (6lb 2.8oz).   Placenta status: spontaneous, complete ,intact .  Cord: NA with the following complications: .  Cord pH: NA  Anesthesia:  Epidural  Episiotomy: None Lacerations: None Suture Repair: NA Est. Blood Loss (mL): 100  Mom to postpartum.  Baby to Couplet care / Skin to Skin.  Thressa ShellerHeather Hogan 02/17/2017, 9:21 PM

## 2016-09-17 ENCOUNTER — Encounter (HOSPITAL_COMMUNITY): Payer: Self-pay | Admitting: *Deleted

## 2016-09-17 ENCOUNTER — Inpatient Hospital Stay (HOSPITAL_COMMUNITY)
Admission: AD | Admit: 2016-09-17 | Discharge: 2016-09-17 | Disposition: A | Discharge status: Home / Self Care | Payer: Medicaid Other | Source: Ambulatory Visit | Attending: Obstetrics and Gynecology | Admitting: Obstetrics and Gynecology

## 2016-09-17 DIAGNOSIS — O09292 Supervision of pregnancy with other poor reproductive or obstetric history, second trimester: Secondary | ICD-10-CM

## 2016-09-17 DIAGNOSIS — O26899 Other specified pregnancy related conditions, unspecified trimester: Secondary | ICD-10-CM

## 2016-09-17 DIAGNOSIS — O26892 Other specified pregnancy related conditions, second trimester: Secondary | ICD-10-CM | POA: Diagnosis not present

## 2016-09-17 DIAGNOSIS — B9689 Other specified bacterial agents as the cause of diseases classified elsewhere: Secondary | ICD-10-CM

## 2016-09-17 DIAGNOSIS — Z3A17 17 weeks gestation of pregnancy: Secondary | ICD-10-CM | POA: Insufficient documentation

## 2016-09-17 DIAGNOSIS — N760 Acute vaginitis: Secondary | ICD-10-CM

## 2016-09-17 DIAGNOSIS — O99332 Smoking (tobacco) complicating pregnancy, second trimester: Secondary | ICD-10-CM | POA: Insufficient documentation

## 2016-09-17 DIAGNOSIS — O26891 Other specified pregnancy related conditions, first trimester: Secondary | ICD-10-CM

## 2016-09-17 DIAGNOSIS — R102 Pelvic and perineal pain: Secondary | ICD-10-CM

## 2016-09-17 DIAGNOSIS — O3432 Maternal care for cervical incompetence, second trimester: Secondary | ICD-10-CM | POA: Insufficient documentation

## 2016-09-17 DIAGNOSIS — O23592 Infection of other part of genital tract in pregnancy, second trimester: Secondary | ICD-10-CM | POA: Insufficient documentation

## 2016-09-17 DIAGNOSIS — R109 Unspecified abdominal pain: Secondary | ICD-10-CM | POA: Diagnosis not present

## 2016-09-17 DIAGNOSIS — R1030 Lower abdominal pain, unspecified: Secondary | ICD-10-CM | POA: Insufficient documentation

## 2016-09-17 DIAGNOSIS — F17210 Nicotine dependence, cigarettes, uncomplicated: Secondary | ICD-10-CM | POA: Insufficient documentation

## 2016-09-17 LAB — URINALYSIS, ROUTINE W REFLEX MICROSCOPIC
BILIRUBIN URINE: NEGATIVE
Glucose, UA: NEGATIVE mg/dL
Hgb urine dipstick: NEGATIVE
Ketones, ur: NEGATIVE mg/dL
Leukocytes, UA: NEGATIVE
NITRITE: NEGATIVE
PH: 6 (ref 5.0–8.0)
Protein, ur: NEGATIVE mg/dL
SPECIFIC GRAVITY, URINE: 1.025 (ref 1.005–1.030)

## 2016-09-17 LAB — WET PREP, GENITAL
SPERM: NONE SEEN
TRICH WET PREP: NONE SEEN
Yeast Wet Prep HPF POC: NONE SEEN

## 2016-09-17 LAB — POCT PREGNANCY, URINE: PREG TEST UR: POSITIVE — AB

## 2016-09-17 MED ORDER — METRONIDAZOLE 500 MG PO TABS: 500.0000 mg | ORAL_TABLET | Freq: Two times a day (BID) | ORAL | 0 refills | Status: DC

## 2016-09-17 NOTE — Discharge Instructions (Signed)
[facility] Prov[facility]  ° ° °[facility]       Phone: [phone] ° °[facility] Phone: [phone] ° °[facility] [place]  Phone: [phone] ° °[facility] [place]  Phone: [phone] ° °[facility] [place]  Phone: [phone] ° °[facility]       Phone: [phone] ° °[facility]    Phone: [phone]  ° °[facility]    Phone: [phone] ° °[facility]    Phone: [phone] ° °[facility]    Phone: [phone] ° °[facility]    Phone: [phone] ° °[facility]       Phone: [phone]  ° °[facility]  Phone: [phone] ° °[facility]    Phone: [phone] ° °[facility]   Phone: [phone] ° °[facility]      Phone: [phone] ° °[facility]    Phone: [phone] ° °

## 2016-09-17 NOTE — MAU Provider Note (Signed)
Chief Complaint: Possible Pregnancy; Abdominal Pain; and Back Pain   None     SUBJECTIVE HPI: Pamela Curtis is a 21 y.o. [id number] at [redacted]w[redacted]d by unsure LMP who presents to maternity admissions reporting bilateral low abdominal pain x 1 month.  The pain is intermittent and is not occurring now while pt is in MAU. Nothing makes the pain better or worse.  It has no associated symptoms. She denies vaginal bleeding, vaginal itching/burning, urinary symptoms, h/a, dizziness, n/v, or fever/chills.    She has hx of previable delivery at 18 weeks in 2014 with cerclage placed in subsequent pregnancy and delivery at term in 2015.     HPI  Past Medical History:  Diagnosis Date  . Cervical incompetence with baby delivered in second trimester 12/04/2012   Needs cerclage and close follow up with subsequent pregnancies   . FAOZHYQM(578.4)    Past Surgical History:  Procedure Laterality Date  . CERVICAL CERCLAGE N/A 08/03/2013   Procedure: CERCLAGE CERVICAL;  Surgeon: Reva Bores, MD;  Location: [facility];  Service: Gynecology;  Laterality: N/A;  . DILATION AND EVACUATION N/A 12/04/2012   Procedure: DILATATION AND EVACUATION;  Surgeon: Tereso Newcomer, MD;  Location: [facility];  Service: Gynecology;  Laterality: N/A;  . NO PAST SURGERIES     Social History   Social History  . Marital status: Single    Spouse name: N/A  . Number of children: N/A  . Years of education: N/A   Occupational History  . Not on file.   Social History Main Topics  . Smoking status: Current Every Day Smoker    Packs/day: 0.25    Types: Cigarettes  . Smokeless tobacco: Never Used  . Alcohol use No     Comment: ocassionally, not while pregnant  . Drug use: No  . Sexual activity: Yes    Birth control/ protection: None   Other Topics Concern  . Not on file   Social History Narrative  . No narrative on file   No current facility-administered medications on file prior to encounter.    Current Outpatient Prescriptions  on File Prior to Encounter  Medication Sig Dispense Refill  . azithromycin (ZITHROMAX) 500 MG tablet Take two tablets by mouth once. (Patient not taking: Reported on 05/19/2016) 2 tablet 0   No Known Allergies  ROS:  Review of Systems  Constitutional: Negative for chills, fatigue and fever.  Respiratory: Negative for shortness of breath.   Cardiovascular: Negative for chest pain.  Gastrointestinal: Positive for abdominal pain. Negative for nausea and vomiting.  Genitourinary: Positive for pelvic pain. Negative for difficulty urinating, dysuria, flank pain, vaginal bleeding, vaginal discharge and vaginal pain.  Neurological: Negative for dizziness and headaches.  Psychiatric/Behavioral: Negative.      I have reviewed patient's Past Medical Hx, Surgical Hx, Family Hx, Social Hx, medications and allergies.   Physical Exam   Patient Vitals for the past 24 hrs:  BP Pulse Height Weight  09/17/16 0222 114/68 98 - -  09/17/16 0210 - - 5\' 5"  (1.651 m) 181 lb (82.1 kg)   Constitutional: Well-developed, well-nourished female in no acute distress.  Cardiovascular: normal rate Respiratory: normal effort GI: Abd soft, non-tender. Pos BS x 4 MS: Extremities nontender, no edema, normal ROM Neurologic: Alert and oriented x 4.  GU: Neg CVAT.  PELVIC EXAM: wet prep and GCC collected by blind swab  FHT 160 by doppler  Fundal height 15 cm  LAB RESULTS Results for orders placed or performed during  the hospital encounter of 09/17/16 (from the past 24 hour(s))  Urinalysis, Routine w reflex microscopic     Status: None   Collection Time: 09/17/16  2:07 AM  Result Value Ref Range   Color, Urine YELLOW YELLOW   APPearance CLEAR CLEAR   Specific Gravity, Urine 1.025 1.005 - 1.030   pH 6.0 5.0 - 8.0   Glucose, UA NEGATIVE NEGATIVE mg/dL   Hgb urine dipstick NEGATIVE NEGATIVE   Bilirubin Urine NEGATIVE NEGATIVE   Ketones, ur NEGATIVE NEGATIVE mg/dL   Protein, ur NEGATIVE NEGATIVE mg/dL    Nitrite NEGATIVE NEGATIVE   Leukocytes, UA NEGATIVE NEGATIVE  Pregnancy, urine POC     Status: Abnormal   Collection Time: 09/17/16  2:28 AM  Result Value Ref Range   Preg Test, Ur POSITIVE (A) NEGATIVE  Wet prep, genital     Status: Abnormal   Collection Time: 09/17/16  3:06 AM  Result Value Ref Range   Yeast Wet Prep HPF POC NONE SEEN NONE SEEN   Trich, Wet Prep NONE SEEN NONE SEEN   Clue Cells Wet Prep HPF POC PRESENT (A) NONE SEEN   WBC, Wet Prep HPF POC FEW (A) NONE SEEN   Sperm NONE SEEN        IMAGING No results found.  MAU Management/MDM: Ordered labs and reviewed results.  FHT by doppler and fundal height of 15 cm are evidence of intrauterine pregnancy. No evidence of cervical shortening or preterm labor.  No acute abdomen or other emergencies. Will treat for BV with Flagyl 500 mg BID x 7 days.  GCC pending.  Pt desires prenatal care but only interested in [facility]. Offered other [facility] offices or [facility] pt pt declined.  List of providers given to pt in case she desires to try other offices.  Outpatient complete OB US ordered to establish dates/ measure cervical length.  Message sent to [facility] to set up care for high risk patient.  Pt to return to MAU as needed for emergencies.  Pt stable at time of discharge.  ASSESSMENT 1. Abdominal pain during pregnancy in second trimester   2. BV (bacterial vaginosis)   3. Pain of round ligament affecting pregnancy, antepartum   4.  History of incompetent cervix, currently pregnant in second trimester   PLAN Discharge home with preterm labor precautions Outpatient [place] ordered F/U in [facility]  Allergies as of 09/17/2016   No Known Allergies     Medication List    STOP taking these medications   azithromycin 500 MG tablet Commonly known as:  ZITHROMAX     TAKE these medications   acetaminophen 500 MG tablet Commonly known as:  TYLENOL Take 500 mg by mouth every 4 (four) hours as needed.   metroNIDAZOLE 500 MG tablet Commonly known as:   FLAGYL Take 1 tablet (500 mg total) by mouth 2 (two) times daily.      Follow-up Information    [facility] Follow up.   Specialty:  Obstetrics and Gynecology Why:  The office will call you with first available appointment or continue to call to set up care. Return to MAU as needed for emergencies. Contact information: [address] [place] [address] [phone]       [facility] Follow up.   Why:  Call to set up prenatal care. Contact information: [address] [place] [address] [phone]          Sharen Counter Certified Nurse-Midwife  09/17/2016  3:49 AM

## 2016-09-17 NOTE — MAU Note (Signed)
Pt reports intermittent abdominal pain for one month. She states that the pain is worse tonight. She is not having pain currently. Pain is located on either side of her abdomen and sometimes is on the right and sometimes on the left. Pt states that she found out she was pregnant about one month ago, but has not started prenatal care. [facility] but is unable to get an appointment. Denies vaginal discharge or bleeding. + intercourse in the last 24 hours.

## 2016-09-18 LAB — GC/CHLAMYDIA PROBE AMP (~~LOC~~) NOT AT ARMC
CHLAMYDIA, DNA PROBE: NEGATIVE
Neisseria Gonorrhea: NEGATIVE

## 2016-09-25 ENCOUNTER — Encounter: Payer: Medicaid Other | Admitting: Obstetrics and Gynecology

## 2016-09-27 ENCOUNTER — Encounter: Payer: Self-pay | Admitting: Obstetrics and Gynecology

## 2016-09-27 ENCOUNTER — Ambulatory Visit (INDEPENDENT_AMBULATORY_CARE_PROVIDER_SITE_OTHER): Payer: Self-pay | Admitting: Obstetrics and Gynecology

## 2016-09-27 DIAGNOSIS — O0990 Supervision of high risk pregnancy, unspecified, unspecified trimester: Secondary | ICD-10-CM

## 2016-09-27 DIAGNOSIS — Z113 Encounter for screening for infections with a predominantly sexual mode of transmission: Secondary | ICD-10-CM

## 2016-09-27 DIAGNOSIS — O9912 Other diseases of the blood and blood-forming organs and certain disorders involving the immune mechanism complicating childbirth: Secondary | ICD-10-CM

## 2016-09-27 DIAGNOSIS — D582 Other hemoglobinopathies: Secondary | ICD-10-CM

## 2016-09-27 LAB — POCT URINALYSIS DIP (DEVICE)
Bilirubin Urine: NEGATIVE
Glucose, UA: NEGATIVE mg/dL
Hgb urine dipstick: NEGATIVE
Ketones, ur: 15 mg/dL — AB
LEUKOCYTES UA: NEGATIVE
NITRITE: NEGATIVE
PROTEIN: 30 mg/dL — AB
Specific Gravity, Urine: 1.02 (ref 1.005–1.030)
UROBILINOGEN UA: 1 mg/dL (ref 0.0–1.0)
pH: 6.5 (ref 5.0–8.0)

## 2016-09-27 NOTE — Progress Notes (Signed)
Addendum: medicaid home form completed 

## 2016-09-27 NOTE — Progress Notes (Signed)
Here for initial prenatal visit. Needs fasting early 2 hr gtt due to bmi. Given new patient education packet. .Declines flu shot.

## 2016-09-27 NOTE — Progress Notes (Signed)
HPI: [name] is a 21 yo [id number] 742w4d with hx of incompetent cervix (18 wk loss w/ chlamydia, cerclage) and high risk in third trimester who is here for initial prenatal care. Since pregnancy she has not experienced any bleeding, discharge, leakage or fetal movement.   She has two concerns today: (1) Abd pain- This has been going on for months, even before pregnancy. It occurs throughout the day and lasts 5-45' at a time. It feels like cramps or her menstrual cycle is about to begin. Some days she doesn't have it, but most days she does. It is a 5/10 on pain scale. If she drinks hot beverages and sits down/relaxes, it helps. Nothing makes it worse. No diarrhea, n/v, fever/chills. Has a BM each day. She was dx'ed with BV 09/27/16 but did not initiate therapy due to cost. (2) She also has experienced an increase in thirst and increase in frequency of urination without pain since pregnancy. She sometimes has minor yellowish discharge when wiping. No hx of DM or GDM. No fevers/chills or flank pain.  PMH: Cervical incompetency - 18 wk loss High risk in third trimester Hbg C  PSH: Cervical cerclage 07/2013 by Dr. Shawnie PonsPratt Dilation and evacuation 11/2012 by Dr. Macon LargeAnyanwu  Social Hx: No drug or alcohol use; however; she smokes cigarettes - 7-8/day. She has been smoking for 5 years. She is interested in quitting and has been successful in the past with previous pregnancies. Since she hasn't had a baby in three years it's harder to quit now. She would like to cut back to 3 cigarettes a day. She does not have any support at home. She is a single mother. She plans to bottle feed. She is interested in circumcision if it is a boy but is concerned about cost. She does not have a pediatrician identified.   Allergies: NKDA  Family Hx: No DM  Vitals: BP: 108/70 HR 95  Fetal HR: 142  Physical Exam: Heart - RRR, no murmur appreciated Lungs - Clear to auscultation bilaterally  Thyroid - no nodules Abdomin -  suprapubic/ LLQ tenderness on deep palpation. No CVA tenderness. Extremities - no rashes/edema  A/P: Abdominal pain during pregnancy in second trimester She has previously had an extensive work up by MAU which dx'ed BV. There was no evidence of cervical shortening or preterm labor. No acute abdomen or other emergencies.  She did not pick up Flagyl 500 mg BID x 7 days b/c she is worried about cost -Re-recommended treatment for BV.  -UA negative for infection, culture sent - will notify patient of results.   This pain since it has been occurring before pregnancy could also represent constipation -Trial Colase supplementation daily and monitor for improvement  Smoking cessation -Discussed risks of smoking -Encouraged cessation. Pt will cut back to 3/day. -Will follow up on progress at next visit  Supervision of pregnancy OB panel, CF, OB urine culture, GC/Chlamydia, glucose challenge test (if abnormal, schedule fasting tolerance testing), quad screen. Anatomy ultrasound/cerclage scheduled.  Hgb electrophoresis - no need to repeat, documented history of Hgb C.  Flu Vaccine - declined Pap smear - uptodate

## 2016-09-28 ENCOUNTER — Ambulatory Visit (HOSPITAL_COMMUNITY)
Admission: RE | Admit: 2016-09-28 | Discharge: 2016-09-28 | Disposition: A | Discharge status: Home / Self Care | Payer: Self-pay | Source: Ambulatory Visit | Attending: Obstetrics and Gynecology | Admitting: Obstetrics and Gynecology

## 2016-09-28 ENCOUNTER — Encounter (HOSPITAL_COMMUNITY): Payer: Self-pay

## 2016-09-28 DIAGNOSIS — O0991 Supervision of high risk pregnancy, unspecified, first trimester: Secondary | ICD-10-CM | POA: Insufficient documentation

## 2016-09-28 DIAGNOSIS — O0990 Supervision of high risk pregnancy, unspecified, unspecified trimester: Secondary | ICD-10-CM

## 2016-09-28 DIAGNOSIS — O09292 Supervision of pregnancy with other poor reproductive or obstetric history, second trimester: Secondary | ICD-10-CM | POA: Insufficient documentation

## 2016-09-28 DIAGNOSIS — Z3A18 18 weeks gestation of pregnancy: Secondary | ICD-10-CM | POA: Insufficient documentation

## 2016-09-28 DIAGNOSIS — O99332 Smoking (tobacco) complicating pregnancy, second trimester: Secondary | ICD-10-CM | POA: Insufficient documentation

## 2016-09-28 DIAGNOSIS — O3432 Maternal care for cervical incompetence, second trimester: Secondary | ICD-10-CM | POA: Insufficient documentation

## 2016-09-28 DIAGNOSIS — Z3A17 17 weeks gestation of pregnancy: Secondary | ICD-10-CM

## 2016-09-28 DIAGNOSIS — Z3689 Encounter for other specified antenatal screening: Secondary | ICD-10-CM | POA: Insufficient documentation

## 2016-09-29 LAB — URINE CULTURE, OB REFLEX

## 2016-09-29 LAB — CULTURE, OB URINE

## 2016-09-29 LAB — GC/CHLAMYDIA PROBE AMP (~~LOC~~) NOT AT ARMC
Chlamydia: NEGATIVE
Neisseria Gonorrhea: NEGATIVE

## 2016-10-01 ENCOUNTER — Telehealth (HOSPITAL_COMMUNITY): Payer: Self-pay

## 2016-10-01 ENCOUNTER — Encounter (HOSPITAL_COMMUNITY): Payer: Self-pay

## 2016-10-01 ENCOUNTER — Other Ambulatory Visit: Payer: Self-pay | Admitting: Obstetrics & Gynecology

## 2016-10-01 NOTE — Telephone Encounter (Signed)
-----   Message from Lorne SkeensNicholas Michael Schenk, MD sent at 09/28/2016 12:49 PM EDT ----- Patient needs scheduled for cerclage on 3/21 with Dr. Debroah LoopArnold.

## 2016-10-01 NOTE — Telephone Encounter (Signed)
Called patient to inform her of her surgery date and time, no answer, left voicemail with information, instructed her to return my call here at the office.

## 2016-10-01 NOTE — Progress Notes (Signed)
   PRENATAL VISIT NOTE  Subjective:  Pamela Curtis is a 21 y.o. [id number] at 1642w1d being seen today for initial prenatal care.  She is currently monitored for the following issues for this high-risk pregnancy and has Hemoglobin C trait (HCC); Incompetent cervix in pregnancy, antepartum; Prior pregnancy complicated by IUGR, antepartum; Active labor at term; and Supervision of high-risk pregnancy on her problem list.  Patient reports patient has history of an early second trimester loss with a previous pregnancy and had cerclage placed with the last pregnancy. She wishes to have another cerclage placed. She has no other concerns toda. .  Contractions: Not present. Vag. Bleeding: None.  Movement: Absent. Denies leaking of fluid.   The following portions of the patient's history were reviewed and updated as appropriate: allergies, current medications, past family history, past medical history, past social history, past surgical history and problem list. Problem list updated.  Objective:   Vitals:   09/27/16 1313  BP: 108/70  Pulse: 95  Weight: 181 lb 9.6 oz (82.4 kg)    Fetal Status: Fetal Heart Rate (bpm): 142   Movement: Absent     General:  Alert, oriented and cooperative. Patient is in no acute distress.  Skin: Skin is warm and dry. No rash noted.   Cardiovascular: Normal heart rate noted  Respiratory: Normal respiratory effort, no problems with respiration noted  Abdomen: Soft, gravid, appropriate for gestational age. Pain/Pressure: Absent     Pelvic:  Cervical exam deferred        Extremities: Normal range of motion.  Edema: None  Mental Status: Normal mood and affect. Normal behavior. Normal judgment and thought content.   Assessment and Plan:  Pregnancy: [id number] at 8642w1d  1. Supervision of high risk pregnancy, antepartum - Obstetric Panel, Including HIV - GC[facility]Chlamydia probe amp ([place] - Culture, OB Urine - AFP, Quad Screen - US MFM OB Transvaginal;  Future  -patient will need cerclage placed. Has not had an ultrasound yet to date. Worked with MFM to move up US date. Added cervical length to US. Scheduled for US on 3/21    Preterm labor symptoms and general obstetric precautions including but not limited to vaginal bleeding, contractions, leaking of fluid and fetal movement were reviewed in detail with the patient. Please refer to After Visit Summary for other counseling recommendations.  No Follow-up on file.   Lorne SkeensNicholas Michael Zephan Beauchaine, MD

## 2016-10-01 NOTE — Telephone Encounter (Signed)
Sharonne called back and confirmed she got my message w/ surgery date and time.

## 2016-10-02 ENCOUNTER — Other Ambulatory Visit: Payer: Self-pay

## 2016-10-02 ENCOUNTER — Telehealth (HOSPITAL_COMMUNITY): Payer: Self-pay

## 2016-10-02 LAB — OBSTETRIC PANEL, INCLUDING HIV
Antibody Screen: NEGATIVE
BASOS ABS: 0 10*3/uL (ref 0.0–0.2)
Basos: 1 %
EOS (ABSOLUTE): 0.2 10*3/uL (ref 0.0–0.4)
EOS: 4 %
HEMOGLOBIN: 13.2 g/dL (ref 11.1–15.9)
HEP B S AG: NEGATIVE
HIV Screen 4th Generation wRfx: NONREACTIVE
Hematocrit: 37.1 % (ref 34.0–46.6)
IMMATURE GRANS (ABS): 0 10*3/uL (ref 0.0–0.1)
IMMATURE GRANULOCYTES: 0 %
LYMPHS ABS: 2.3 10*3/uL (ref 0.7–3.1)
Lymphs: 40 %
MCH: 29.1 pg (ref 26.6–33.0)
MCHC: 35.6 g/dL (ref 31.5–35.7)
MCV: 82 fL (ref 79–97)
MONOCYTES: 8 %
Monocytes Absolute: 0.5 10*3/uL (ref 0.1–0.9)
NEUTROS PCT: 47 %
Neutrophils Absolute: 2.7 10*3/uL (ref 1.4–7.0)
PLATELETS: 289 10*3/uL (ref 150–379)
RBC: 4.53 x10E6/uL (ref 3.77–5.28)
RDW: 14 % (ref 12.3–15.4)
RH TYPE: POSITIVE
RPR: NONREACTIVE
RUBELLA: 3.67 {index} (ref >0.99)
WBC: 5.7 10*3/uL (ref 3.4–10.8)

## 2016-10-02 LAB — AFP, QUAD SCREEN
DIA Mom Value: 0.88
DIA Value (EIA): 140.81 pg/mL
DSR (By Age)    1 IN: 1114
DSR (Second Trimester) 1 IN: 10000
GESTATIONAL AGE AFP: 18.6 wk
MSAFP MOM: 0.95
MSAFP: 43.1 ng/mL
MSHCG MOM: 0.33
MSHCG: 7645 m[IU]/mL
Maternal Age At EDD: 22.6 YEARS
OSB RISK: 10000
Test Results:: NEGATIVE
UE3 MOM: 1.19
UE3 VALUE: 1.7 ng/mL
Weight: 181 [lb_av]

## 2016-10-02 NOTE — Telephone Encounter (Signed)
Received a call from Tamika in Pre-Op who informed me that Pamela Curtis told her that she would not be coming tomorrow because of work. Tamika informed Pamela Curtis that she needed to contact me to reschedule.

## 2016-10-03 ENCOUNTER — Ambulatory Visit (HOSPITAL_COMMUNITY): Admission: RE | Admit: 2016-10-03 | Payer: Self-pay | Source: Ambulatory Visit | Admitting: Obstetrics & Gynecology

## 2016-10-03 ENCOUNTER — Ambulatory Visit (HOSPITAL_COMMUNITY): Payer: Medicaid Other

## 2016-10-03 ENCOUNTER — Encounter (HOSPITAL_COMMUNITY): Admission: RE | Payer: Self-pay | Source: Ambulatory Visit

## 2016-10-03 SURGERY — CERCLAGE, CERVIX, VAGINAL APPROACH
Anesthesia: Choice

## 2016-10-07 ENCOUNTER — Inpatient Hospital Stay (HOSPITAL_COMMUNITY)
Admission: AD | Admit: 2016-10-07 | Discharge: 2016-10-07 | Disposition: A | Discharge status: Home / Self Care | Payer: Self-pay | Source: Ambulatory Visit | Attending: Obstetrics & Gynecology | Admitting: Obstetrics & Gynecology

## 2016-10-07 ENCOUNTER — Encounter (HOSPITAL_COMMUNITY): Payer: Self-pay

## 2016-10-07 DIAGNOSIS — O99332 Smoking (tobacco) complicating pregnancy, second trimester: Secondary | ICD-10-CM | POA: Insufficient documentation

## 2016-10-07 DIAGNOSIS — R110 Nausea: Secondary | ICD-10-CM | POA: Insufficient documentation

## 2016-10-07 DIAGNOSIS — Z79899 Other long term (current) drug therapy: Secondary | ICD-10-CM | POA: Insufficient documentation

## 2016-10-07 DIAGNOSIS — R42 Dizziness and giddiness: Secondary | ICD-10-CM | POA: Insufficient documentation

## 2016-10-07 DIAGNOSIS — Z9889 Other specified postprocedural states: Secondary | ICD-10-CM | POA: Insufficient documentation

## 2016-10-07 DIAGNOSIS — O26892 Other specified pregnancy related conditions, second trimester: Secondary | ICD-10-CM | POA: Insufficient documentation

## 2016-10-07 DIAGNOSIS — Z3A20 20 weeks gestation of pregnancy: Secondary | ICD-10-CM | POA: Insufficient documentation

## 2016-10-07 DIAGNOSIS — O210 Mild hyperemesis gravidarum: Secondary | ICD-10-CM

## 2016-10-07 DIAGNOSIS — E860 Dehydration: Secondary | ICD-10-CM

## 2016-10-07 LAB — URINALYSIS, ROUTINE W REFLEX MICROSCOPIC
Bilirubin Urine: NEGATIVE
GLUCOSE, UA: NEGATIVE mg/dL
Hgb urine dipstick: NEGATIVE
KETONES UR: 20 mg/dL — AB
Nitrite: NEGATIVE
PH: 6 (ref 5.0–8.0)
Protein, ur: 30 mg/dL — AB
Specific Gravity, Urine: 1.024 (ref 1.005–1.030)

## 2016-10-07 MED ORDER — ACETAMINOPHEN 325 MG PO TABS
650.0000 mg | ORAL_TABLET | Freq: Once | ORAL | Status: AC
  Administered 2016-10-07: 650 mg via ORAL
  Filled 2016-10-07: qty 2

## 2016-10-07 NOTE — MAU Note (Addendum)
Patient presents with feeling shaky and lightheaded at work, cramping with walking, was supposed to get cerclage but did not, has not eaten all day.  Has not been able to keep anything much down, but not currently taking nausea meds.

## 2016-10-07 NOTE — MAU Provider Note (Signed)
History     CSN: [id number]  Arrival date and time: 10/07/16 1358   First Provider Initiated Contact with Patient 10/07/16 1439      Chief Complaint  Patient presents with  . Abdominal Cramping  . Dizziness   HPI  Pamela Curtis 21 y.o. [redacted]w[redacted]d   Comes in today as she was at work and got hot and dizzy.  Left work early and drove to the hospital.  Has not eaten today as she sometimes has nausea and is afraid to eat.  Last vomiting was yesterday.  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  Admits that the vomiting is getting better.  Discussed at length with her that her dizziness today and feeling poorly is likely related to no food.  States she had water and then ate candy - Now and Later - on the way to the hospital.  No nausea at present but does have a headache.  Has had one episode of cramping earlier today but is not having cramping while resting in bed.  Next appointment in the [facility] is April 12.  She missed her cerclage scheduled on March 21 as it was the 3rd day of a new job and she has missed the first 2 days due to doctor appointments.  She takes her child to school at 8 am daily and needs an appointment later than 9 am.  Usually works 9-11:30 am daily.  Would like an appointment for the cerclage in the afternoon.  OB History    Gravida Para Term Preterm AB Living   5 2 2   2 2    SAB TAB Ectopic Multiple Live Births   1 1     2       Past Medical History:  Diagnosis Date  . Cervical incompetence with baby delivered in second trimester 12/04/2012   Needs cerclage and close follow up with subsequent pregnancies   . [id number](478.2)     Past Surgical History:  Procedure Laterality Date  . CERVICAL CERCLAGE N/A 08/03/2013   Procedure: CERCLAGE CERVICAL;  Surgeon: Reva Bores, MD;  Location: [facility];  Service: Gynecology;  Laterality: N/A;  . DILATION AND EVACUATION N/A 12/04/2012   Procedure: DILATATION AND EVACUATION;   Surgeon: Tereso Newcomer, MD;  Location: [facility];  Service: Gynecology;  Laterality: N/A;  . NO PAST SURGERIES      History reviewed. No pertinent family history.  Social History  Substance Use Topics  . Smoking status: Current Every Day Smoker    Packs/day: 0.50    Types: Cigarettes  . Smokeless tobacco: Never Used  . Alcohol use Yes     Comment: ocassionally, not while pregnant    Allergies: No Known Allergies  Prescriptions Prior to Admission  Medication Sig Dispense Refill Last Dose  . acetaminophen (TYLENOL) 500 MG tablet Take 1,000 mg by mouth daily as needed for moderate pain or headache.    Taking  . metroNIDAZOLE (FLAGYL) 500 MG tablet Take 1 tablet (500 mg total) by mouth 2 (two) times daily. (Patient not taking: Reported on 09/27/2016) 14 tablet 0 Not Taking at Unknown time    Review of Systems  Constitutional: Negative for fever.  Gastrointestinal: Positive for nausea. Negative for vomiting.       Occasional cramping - once today  Genitourinary: Negative for dysuria, urgency, vaginal bleeding and vaginal discharge.  Neurological: Positive for headaches.   Physical Exam   Blood  pressure (!) 105/59, pulse 91, temperature 97.9 F (36.6 C), temperature source Oral, resp. rate 18, height 5\' 5"  (1.651 m), weight 183 lb 1.9 oz (83.1 kg), last menstrual period 05/20/2016, SpO2 98 %, unknown if currently breastfeeding.  Physical Exam  Nursing note and vitals reviewed. Constitutional: She is oriented to person, place, and time. She appears well-developed and well-nourished.  HENT:  Head: Normocephalic.  Eyes: EOM are normal.  Neck: Neck supple.  GI: Soft. There is no tenderness.  Fundus is slightly above the umbilicus.  Musculoskeletal: Normal range of motion.  Neurological: She is alert and oriented to person, place, and time.  Skin: Skin is warm and dry.  Psychiatric: She has a normal mood and affect.    MAU Course  Procedures Results for orders placed or  performed during the hospital encounter of 10/07/16 (from the past 24 hour(s))  Urinalysis, Routine w reflex microscopic     Status: Abnormal   Collection Time: 10/07/16  4:30 PM  Result Value Ref Range   Color, Urine YELLOW YELLOW   APPearance CLOUDY (A) CLEAR   Specific Gravity, Urine 1.024 1.005 - 1.030   pH 6.0 5.0 - 8.0   Glucose, UA NEGATIVE NEGATIVE mg/dL   Hgb urine dipstick NEGATIVE NEGATIVE   Bilirubin Urine NEGATIVE NEGATIVE   Ketones, ur 20 (A) NEGATIVE mg/dL   Protein, ur 30 (A) NEGATIVE mg/dL   Nitrite NEGATIVE NEGATIVE   Leukocytes, UA LARGE (A) NEGATIVE   RBC / HPF 6-30 0 - 5 RBC/hpf   WBC, UA 6-30 0 - 5 WBC/hpf   Bacteria, UA RARE (A) NONE SEEN   Squamous Epithelial / LPF TOO NUMEROUS TO COUNT (A) NONE SEEN   Mucous PRESENT     MDM Unable to void on arrival.  Discussed at length the options for nausea - Phenergan with side effect of drowsiness and Zofran with the side effect of constipation.  Client reports the nausea is decreasing over time and elects expectant management.  Stressed the importance of taking in fluids and some foods even if it is crackers every 2-3 hours.  Tylenol 650 mg given for headache.  Records reviewed.  Last ultrasound was 09-30-16 and cervical length was 4.3 cm.  Recommends cervical length every 1-2 weeks if no cerclage.  Given there are not contractions today, will defer Ultrasound to check for one more week.  Message sent to clinic to reschedule cerclage placement.  Client has rested after receiving Tylenol.  Feeling better and is ready to go home.  Assessment and Plan  Dizziness and headache likely due to not eating Periodic nausea  Plan You are not drinking enough fluids.   You need at least 8 glasses of water every day. Not having enough fluids or food is probably causing your dizziness and contributing more to the nausea. Eat small amounts of food every 2-3 hours Keep your prenatal care appointments - expect the clinic to call you  about the cerclage or you call them. Return if you are having abdominal pain.   Tatia Petrucci L Jackie Littlejohn 10/07/2016, 2:54 PM

## 2016-10-07 NOTE — Discharge Instructions (Signed)
You are not drinking enough fluids.   You need at least 8 glasses of water every day. Not having enough fluids or food is probably causing your dizziness and contributing more to the nausea. Eat small amounts of food every 2-3 hours Keep your prenatal care appointments - expect the clinic to call you about the cerclage or you call them. Return if you are having abdominal pain.

## 2016-10-11 ENCOUNTER — Encounter (HOSPITAL_COMMUNITY): Payer: Self-pay

## 2016-10-12 ENCOUNTER — Encounter (HOSPITAL_COMMUNITY): Payer: Self-pay | Admitting: *Deleted

## 2016-10-16 ENCOUNTER — Telehealth: Payer: Self-pay | Admitting: *Deleted

## 2016-10-16 NOTE — Telephone Encounter (Signed)
Patient left message on nurse line. Recently in ED for shakiness, n/v/ lightheadedness. Has this issue every time she goes to work. She feels nauseous and if she eats before work she throws up while at work. Was told be ER staff she needs to eat regardless but it is difficult and has been missing work. She is having a cerclage placed on 4/5, wants a note for work to keep her out till 4/7. Please return call.

## 2016-10-18 ENCOUNTER — Encounter (HOSPITAL_COMMUNITY): Payer: Self-pay | Admitting: Anesthesiology

## 2016-10-18 SURGERY — CERCLAGE, CERVIX, VAGINAL APPROACH
Anesthesia: Choice

## 2016-10-18 MED ORDER — BUPIVACAINE HCL (PF) 0.5 % IJ SOLN
INTRAMUSCULAR | Status: AC
  Filled 2016-10-18: qty 30

## 2016-10-18 MED ORDER — DOXYCYCLINE HYCLATE 100 MG IV SOLR
100.0000 mg | Freq: Once | INTRAVENOUS | Status: DC
  Filled 2016-10-18: qty 100

## 2016-10-18 MED ORDER — INDOMETHACIN 50 MG RE SUPP
100.0000 mg | Freq: Once | RECTAL | Status: DC
  Filled 2016-10-18: qty 2

## 2016-10-18 NOTE — Anesthesia Preprocedure Evaluation (Deleted)
Anesthesia Evaluation    Airway        Dental   Pulmonary Current Smoker,           Cardiovascular negative cardio ROS       Neuro/Psych  Headaches, negative psych ROS   GI/Hepatic negative GI ROS, Neg liver ROS,   Endo/Other  negative endocrine ROS  Renal/GU negative Renal ROS  negative genitourinary   Musculoskeletal   Abdominal   Peds  Hematology negative hematology ROS (+)   Anesthesia Other Findings   Reproductive/Obstetrics (+) Pregnancy Incompetent cervix                             Anesthesia Physical Anesthesia Plan  ASA: II  Anesthesia Plan: Spinal   Post-op Pain Management:    Induction:   Airway Management Planned: Natural Airway  Additional Equipment:   Intra-op Plan:   Post-operative Plan:   Informed Consent:   Plan Discussed with:   Anesthesia Plan Comments:         Anesthesia Quick Evaluation

## 2016-10-23 NOTE — Telephone Encounter (Signed)
In reviewing chart patient has appointment scheduled for 10/25/2016. I have attempted to call patient but there was no answer at this time.

## 2016-10-24 ENCOUNTER — Ambulatory Visit (HOSPITAL_COMMUNITY): Admission: RE | Admit: 2016-10-24 | Payer: Self-pay | Source: Ambulatory Visit | Admitting: Obstetrics & Gynecology

## 2016-10-25 ENCOUNTER — Encounter: Payer: Self-pay | Admitting: Family Medicine

## 2016-11-01 ENCOUNTER — Encounter: Payer: Self-pay | Admitting: Obstetrics & Gynecology

## 2016-11-07 ENCOUNTER — Encounter: Payer: Self-pay | Admitting: Family Medicine

## 2016-11-07 ENCOUNTER — Other Ambulatory Visit: Payer: Self-pay | Admitting: Family Medicine

## 2016-11-07 ENCOUNTER — Ambulatory Visit (INDEPENDENT_AMBULATORY_CARE_PROVIDER_SITE_OTHER): Payer: Self-pay | Admitting: Family Medicine

## 2016-11-07 ENCOUNTER — Ambulatory Visit (HOSPITAL_COMMUNITY)
Admission: RE | Admit: 2016-11-07 | Discharge: 2016-11-07 | Disposition: A | Discharge status: Home / Self Care | Payer: Self-pay | Source: Ambulatory Visit | Attending: Family Medicine | Admitting: Family Medicine

## 2016-11-07 ENCOUNTER — Encounter (HOSPITAL_COMMUNITY): Payer: Self-pay

## 2016-11-07 VITALS — BP 121/72 | HR 101 | Wt 186.5 lb

## 2016-11-07 DIAGNOSIS — Z3689 Encounter for other specified antenatal screening: Secondary | ICD-10-CM | POA: Insufficient documentation

## 2016-11-07 DIAGNOSIS — O0992 Supervision of high risk pregnancy, unspecified, second trimester: Secondary | ICD-10-CM

## 2016-11-07 DIAGNOSIS — O3432 Maternal care for cervical incompetence, second trimester: Secondary | ICD-10-CM

## 2016-11-07 DIAGNOSIS — O09292 Supervision of pregnancy with other poor reproductive or obstetric history, second trimester: Secondary | ICD-10-CM

## 2016-11-07 DIAGNOSIS — Z3A24 24 weeks gestation of pregnancy: Secondary | ICD-10-CM

## 2016-11-07 DIAGNOSIS — D582 Other hemoglobinopathies: Secondary | ICD-10-CM

## 2016-11-07 DIAGNOSIS — O09299 Supervision of pregnancy with other poor reproductive or obstetric history, unspecified trimester: Secondary | ICD-10-CM

## 2016-11-07 DIAGNOSIS — O99332 Smoking (tobacco) complicating pregnancy, second trimester: Secondary | ICD-10-CM

## 2016-11-07 DIAGNOSIS — O09212 Supervision of pregnancy with history of pre-term labor, second trimester: Secondary | ICD-10-CM

## 2016-11-07 DIAGNOSIS — O3430 Maternal care for cervical incompetence, unspecified trimester: Secondary | ICD-10-CM

## 2016-11-07 MED ORDER — PROGESTERONE MICRONIZED 100 MG PO CAPS: 100.0000 mg | ORAL_CAPSULE | Freq: Every day | ORAL | 3 refills | Status: DC

## 2016-11-07 NOTE — Progress Notes (Signed)
Subjective:  Pamela Curtis is a 21 y.o. [phone] at [redacted]w[redacted]d being seen today for ongoing prenatal care.  She is currently monitored for the following issues for this high-risk pregnancy and has Hemoglobin C trait (HCC); Incompetent cervix in pregnancy, antepartum; Prior pregnancy complicated by IUGR, antepartum; Supervision of high-risk pregnancy; and Previous preterm delivery in second trimester, antepartum on her problem list.  Patient reports no complaints.  Contractions: Not present. Vag. Bleeding: None.  Movement: Present. Denies leaking of fluid.   Discussed with patient Ob history: 1st pregnancy, child full term, no complications. 2nd pregnancy, 2nd trim loss @ 18wks (w/ Chlamydia infection), 3rd pregnancy, cerclage placed, child full term. 4th pregnancy, TAB. She reports she was unable to make both scheduled cases for cerclage placement, first time due to new job and could not miss work, second time she was very ill and unable to leave bed. She has missed a lot of appointments due to job as well.   The following portions of the patient's history were reviewed and updated as appropriate: allergies, current medications, past family history, past medical history, past social history, past surgical history and problem list. Problem list updated.  Objective:   Vitals:   11/07/16 0937  BP: 121/72  Pulse: (!) 101  Weight: 186 lb 8 oz (84.6 kg)    Fetal Status: Fetal Heart Rate (bpm): 148 Fundal Height: 24 cm Movement: Present      General:  Alert, oriented and cooperative. Patient is in no acute distress.  Skin: Skin is warm and dry. No rash noted.   Cardiovascular: Normal heart rate noted  Respiratory: Normal respiratory effort, no problems with respiration noted  Abdomen: Soft, gravid, appropriate for gestational age. Pain/Pressure: Present     Pelvic:  Cervical exam performed closed/appears long/posterior  Extremities: Normal range of motion.  Edema: None  Mental Status: Normal  mood and affect. Normal behavior. Normal judgment and thought content.   Urinalysis:      Assessment and Plan:  Pregnancy: [id number] at [redacted]w[redacted]d  1. Supervision of high risk pregnancy in second trimester - Return in 4 weeks for OB appt and 2 hr GTT and third trim labs - GC/CT today  2. Hemoglobin C trait (HCC)  3. Incompetent cervix in pregnancy, antepartum - CL today; GC/CT today - ADDENDUM: CL 3.59cm after [place] appt today, anatomy scan was normal, recommended repeat growth [place] in 6 weeks 2/2 previous IUGR baby. - [place] MFM OB Transvaginal; Future  4. Previous preterm delivery in second trimester, antepartum - Reviewed options to patient, due to missed two OR cerclage appts, and too late for [facility], other option is to start Prometrium (discussed with Dr. Erin Fulling). Patient would like to do vaginal progesterone (Prometrium) if can afford, discussed to use [employer] and it is $25, she thinks she may be able to afford this. - progesterone (PROMETRIUM) 100 MG capsule; Place 1 capsule (100 mg total) vaginally daily. Insert at bedtime. Stop at 37 weeks.  Dispense: 30 capsule; Refill: 3  5. Prior pregnancy complicated by IUGR, antepartum - Repeat growth [place] in 6 weeks    MOC: IUD MOF: Bottlefeed Preterm labor symptoms and general obstetric precautions including but not limited to vaginal bleeding, contractions, leaking of fluid and fetal movement were reviewed in detail with the patient. Please refer to After Visit Summary for other counseling recommendations.  Return in about 4 weeks (around 12/05/2016) for Routine OB visit; 2 hr GTT.   Jen Mow, DO [facility]  for [facility], [facility]

## 2016-11-07 NOTE — Progress Notes (Signed)
OB f/u US scheduled for April 25th @ 1315.  Pt notified.

## 2016-11-07 NOTE — Patient Instructions (Addendum)
Preventing Preterm Birth Preterm birth is when your baby is delivered between 20 weeks and 37 weeks of pregnancy. A full-term pregnancy lasts for at least 37 weeks. Preterm birth can be dangerous for your baby because the last few weeks of pregnancy are an important time for your baby's brain and lungs to grow. Many things can cause a baby to be born early. Sometimes the cause is not known. There are certain factors that make you more likely to experience preterm birth, such as:  Having a previous baby born preterm.  Being pregnant with twins or other multiples.  Having had fertility treatment.  Being overweight or underweight at the start of your pregnancy.  Having any of the following during pregnancy:  An infection, including a urinary tract infection (UTI) or an STI (sexually transmitted infection).  High blood pressure.  Diabetes.  Vaginal bleeding.  Being age 10 or older.  Being age 82 or younger.  Getting pregnant within 6 months of a previous pregnancy.  Suffering extreme stress or physical or emotional abuse during pregnancy.  Standing for long periods of time during pregnancy, such as working at a job that requires standing. What are the risks? The most serious risk of preterm birth is that the baby may not survive. This is more likely to happen if a baby is born before 34 weeks. Other risks and complications of preterm birth may include your baby having:  Breathing problems.  Brain damage that affects movement and coordination (cerebral palsy).  Feeding difficulties.  Vision or hearing problems.  Infections or inflammation of the digestive tract (colitis).  Developmental delays.  Learning disabilities.  Higher risk for diabetes, heart disease, and high blood pressure later in life. What can I do to lower my risk? Medical care  The most important thing you can do to lower your risk for preterm birth is to get routine medical care during pregnancy (prenatal  care). If you have a high risk of preterm birth, you may be referred to a health care provider who specializes in managing high-risk pregnancies (perinatologist). You may be given medicine to help prevent preterm birth. Lifestyle changes  Certain lifestyle changes can also lower your risk of preterm birth:  Wait at least 6 months after a pregnancy to become pregnant again.  Try to plan pregnancy for when you are between 34 and 50 years old.  Get to a healthy weight before getting pregnant. If you are overweight, work with your health care provider to safely lose weight.  Do not use any products that contain nicotine or tobacco, such as cigarettes and e-cigarettes. If you need help quitting, ask your health care provider.  Do not drink alcohol.  Do not use drugs. Where to find support: For more support, consider:  Talking with your health care provider.  Talking with a therapist or substance abuse counselor, if you need help quitting.  Working with a diet and nutrition specialist (dietitian) or a Systems analyst to maintain a healthy weight.  Joining a support group. Where to find more information: Learn more about preventing preterm birth from:  Centers for Disease Control and Prevention: http://curry.org/  March of Dimes: marchofdimes.org/complications/premature-babies.aspx  American Pregnancy Association: americanpregnancy.org/labor-and-birth/premature-labor Contact a health care provider if:  You have any of the following signs of preterm labor before 37 weeks:  A change or increase in vaginal discharge.  Fluid leaking from your vagina.  Pressure or cramps in your lower abdomen.  A backache that does not go away or gets  worse.  Regular tightening (contractions) in your lower abdomen. Summary  Preterm birth means having your baby during weeks 20-37 of pregnancy.  Preterm birth may put your baby at risk for physical  and mental problems.  Getting good prenatal care can help prevent preterm birth.  You can lower your risk of preterm birth by making certain lifestyle changes, such as not smoking and not using alcohol. This information is not intended to replace advice given to you by your health care provider. Make sure you discuss any questions you have with your health care provider. Document Released: 08/16/2015 Document Revised: 03/10/2016 Document Reviewed: 03/10/2016 Elsevier Interactive Patient Education  2017 [employer].   Places to have your son circumcised:    [facility] [phone] $480 by 4 wks  [facility] [phone] $244 by 4 wks  [facility] [phone] $175 by 2 wks  [facility] [phone] $250 by 7 days [facility] [phone] $150 by 4 wks  These prices sometimes change but are roughly what you can expect to pay. Please call and confirm pricing.   Circumcision is considered an elective/non-medically necessary procedure. There are many reasons parents decide to have their sons circumsized. During the first year of life circumcised males have a reduced risk of urinary tract infections but after this year the rates between circumcised males and uncircumcised males are the same.  It is safe to have your son circumcised outside of the hospital and the places above perform them regularly.

## 2016-11-08 ENCOUNTER — Other Ambulatory Visit (HOSPITAL_COMMUNITY): Payer: Self-pay | Admitting: *Deleted

## 2016-11-08 DIAGNOSIS — O09299 Supervision of pregnancy with other poor reproductive or obstetric history, unspecified trimester: Secondary | ICD-10-CM

## 2016-11-08 LAB — CERVICOVAGINAL ANCILLARY ONLY
Chlamydia: NEGATIVE
Neisseria Gonorrhea: NEGATIVE

## 2016-12-11 ENCOUNTER — Encounter: Payer: Self-pay | Admitting: Family Medicine

## 2016-12-13 ENCOUNTER — Telehealth: Payer: Self-pay

## 2016-12-13 NOTE — Telephone Encounter (Signed)
Patient has been scheduled for MFM US. She is a self pay patient.

## 2016-12-19 ENCOUNTER — Ambulatory Visit (HOSPITAL_COMMUNITY)
Admission: RE | Admit: 2016-12-19 | Discharge: 2016-12-19 | Disposition: A | Discharge status: Home / Self Care | Payer: Self-pay | Source: Ambulatory Visit | Attending: Family Medicine | Admitting: Family Medicine

## 2016-12-19 ENCOUNTER — Ambulatory Visit (HOSPITAL_COMMUNITY): Payer: Self-pay

## 2016-12-19 ENCOUNTER — Encounter (HOSPITAL_COMMUNITY): Payer: Self-pay

## 2016-12-20 ENCOUNTER — Ambulatory Visit (INDEPENDENT_AMBULATORY_CARE_PROVIDER_SITE_OTHER): Payer: Self-pay | Admitting: Obstetrics and Gynecology

## 2016-12-20 VITALS — BP 96/59 | HR 90 | Wt 191.5 lb

## 2016-12-20 DIAGNOSIS — O3430 Maternal care for cervical incompetence, unspecified trimester: Secondary | ICD-10-CM

## 2016-12-20 DIAGNOSIS — O3433 Maternal care for cervical incompetence, third trimester: Secondary | ICD-10-CM

## 2016-12-20 DIAGNOSIS — O09299 Supervision of pregnancy with other poor reproductive or obstetric history, unspecified trimester: Secondary | ICD-10-CM

## 2016-12-20 DIAGNOSIS — Z23 Encounter for immunization: Secondary | ICD-10-CM

## 2016-12-20 DIAGNOSIS — O09293 Supervision of pregnancy with other poor reproductive or obstetric history, third trimester: Secondary | ICD-10-CM

## 2016-12-20 DIAGNOSIS — O0993 Supervision of high risk pregnancy, unspecified, third trimester: Secondary | ICD-10-CM

## 2016-12-20 DIAGNOSIS — O09213 Supervision of pregnancy with history of pre-term labor, third trimester: Secondary | ICD-10-CM

## 2016-12-20 DIAGNOSIS — O09212 Supervision of pregnancy with history of pre-term labor, second trimester: Secondary | ICD-10-CM

## 2016-12-20 MED ORDER — PROGESTERONE MICRONIZED 100 MG PO CAPS: 100.0000 mg | ORAL_CAPSULE | Freq: Every day | ORAL | 3 refills | Status: DC

## 2016-12-20 NOTE — Patient Instructions (Addendum)
Places to have your son circumcised:    [facility] [phone] $[phone] by 4 wks  [facility] [phone] $244 by 4 wks  [facility] [phone] $[phone] by 2 wks  [facility] [phone] $[phone] by 7 days [facility] [phone] $150 by 4 wks  These prices sometimes change but are roughly what you can expect to pay. Please call and confirm pricing.   Circumcision is considered an elective/non-medically necessary procedure. There are many reasons parents decide to have their sons circumsized. During the first year of life circumcised males have a reduced risk of urinary tract infections but after this year the rates between circumcised males and uncircumcised males are the same.  It is safe to have your son circumcised outside of the hospital and the places above perform them regularly.   AREA PEDIATRIC/FAMILY PRACTICE PHYSICIANS  [facility] [address], [address] [place], [place]  [address] Phone - [phone]   Fax - [phone]  [facility] [address] [place], [place] [address] Phone - [phone]   Fax - [phone]  [facility] [address] [place], [place]  [address] Phone - [phone]   Fax - [phone]  [facility] [address] [place], [place]  [address] Phone - [phone]   Fax - [phone]  [facility] [address] [place], [place]  [address] Phone - [phone]   Fax - [phone]  [facility] [address] [place], [place]  [address] Phone - [phone]   Fax - [phone]  [facility] [address] [place], [place]  [address] Phone - [phone]   Fax - [phone]  [facility] [address], [address] [place], [place]  [address] Phone - [phone]   Fax - [phone]  [facility] [address] [place], [place]  [address] Phone - [phone]   Fax - [phone] [facility] [address] [place], [place]  [address] Phone - [phone]   Fax - [phone]  [facility] [address] [place], [place]  [address] Phone - [phone]   Fax - [phone]  [facility] [address] [place], [place]  [address] Phone - [phone]   Fax - [phone]  [facility] [address], [address] [place], [place]  [address] Phone - [phone]   Fax - [phone]  [facility]IL[facility] GOSR[facility] [address] [place], [place]  [address] Phone - [phone]  [facility] [address] [place], [place]  [address] Phone - [phone]   Fax - [phone]  [facility] [address] [place], [place]  [address] Phone - [phone]   Fax - [phone]  [facility] [address] [place], [place]  [address] Phone - [phone]   Fax - [phone]  [facility] [address] [place], [place]  [address] Phone - [phone]   Fax - [phone]   [facility] [address] [place], [place]  [address] Phone - [phone]   Fax - [phone]  [facility] [address] [place], [place]  [address] Phone - [phone]   Fax - [phone]  [facility] [address] , [place]  [address] Phone - [phone]   Fax - [phone]  [facility] [address] [place], [place]  [address] Phone - [phone]   Fax - [phone]  [facility] [address] [place] [place], [place]  [address] Phone - [phone]   Fax - [phone] [facility] [address] [place], [place]  [address] Phone - [phone]   Fax - [phone]  [facility] [address] [place], [place]  [address] Phone - [phone]   Fax - [phone]  [facility] [address] [place], [place]  [address] Phone - [phone]   Fax - [phone]  [facility] Wyvonne Lenzharlene Flemming MD [address] [place] [address] Phone [phone]  Fax [phone]

## 2016-12-20 NOTE — Progress Notes (Signed)
   PRENATAL VISIT NOTE  Subjective:  Pamela Curtis is a 21 y.o. [phone] at 6783w4d being seen today for ongoing prenatal care.  She is currently monitored for the following issues for this high-risk pregnancy and has Hemoglobin C trait (HCC); Incompetent cervix in pregnancy, antepartum; Prior pregnancy complicated by IUGR, antepartum; Supervision of high-risk pregnancy; and Previous preterm delivery in second trimester, antepartum on her problem list.  Patient reports occasional cramping pain.  Contractions: Irritability. Vag. Bleeding: None.  Movement: Present. Denies leaking of fluid.   The following portions of the patient's history were reviewed and updated as appropriate: allergies, current medications, past family history, past medical history, past social history, past surgical history and problem list. Problem list updated.  Objective:   Vitals:   12/20/16 1408  BP: (!) 96/59  Pulse: 90  Weight: 191 lb 8 oz (86.9 kg)    Fetal Status: Fetal Heart Rate (bpm): 134 Fundal Height: 30 cm Movement: Present     General:  Alert, oriented and cooperative. Patient is in no acute distress.  Skin: Skin is warm and dry. No rash noted.   Cardiovascular: Normal heart rate noted  Respiratory: Normal respiratory effort, no problems with respiration noted  Abdomen: Soft, gravid, appropriate for gestational age. Pain/Pressure: Present     Pelvic:  Cervical exam deferred        Extremities: Normal range of motion.  Edema: None  Mental Status: Normal mood and affect. Normal behavior. Normal judgment and thought content.   Assessment and Plan:  Pregnancy: [id number] at 5883w4d  1. Supervision of high risk pregnancy in third trimester Patient is doing well She will return for glucola and third trimester labs before her next appointment - Tdap vaccine greater than or equal to 7yo IM  2. Prior pregnancy complicated by IUGR, antepartum Follow up ultrasound on 6/11 - Tdap vaccine greater than or equal  to 7yo IM  3. Previous preterm delivery in second trimester, antepartum Patient instructed to take prometrium given her history - Tdap vaccine greater than or equal to 7yo IM - progesterone (PROMETRIUM) 100 MG capsule; Place 1 capsule (100 mg total) vaginally daily. Insert at bedtime. Stop at 37 weeks.  Dispense: 30 capsule; Refill: 3  4. 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 Please refer to After Visit Summary for other counseling recommendations.  Return in about 2 weeks (around 01/03/2017) for ROB.   Catalina AntiguaPeggy Maelee Hoot, MD

## 2016-12-20 NOTE — Progress Notes (Signed)
Patient has not been taking progesterone, didn't know she was supposed to. Reports increase in menstrual like cramps & pressure

## 2016-12-24 ENCOUNTER — Ambulatory Visit (HOSPITAL_COMMUNITY)
Admission: RE | Admit: 2016-12-24 | Discharge: 2016-12-24 | Disposition: A | Discharge status: Home / Self Care | Payer: Self-pay | Source: Ambulatory Visit | Attending: Family Medicine | Admitting: Family Medicine

## 2016-12-24 ENCOUNTER — Encounter (HOSPITAL_COMMUNITY): Payer: Self-pay

## 2016-12-24 ENCOUNTER — Other Ambulatory Visit (HOSPITAL_COMMUNITY): Payer: Self-pay | Admitting: Obstetrics and Gynecology

## 2016-12-24 DIAGNOSIS — O99333 Smoking (tobacco) complicating pregnancy, third trimester: Secondary | ICD-10-CM | POA: Insufficient documentation

## 2016-12-24 DIAGNOSIS — Z3689 Encounter for other specified antenatal screening: Secondary | ICD-10-CM | POA: Insufficient documentation

## 2016-12-24 DIAGNOSIS — O09299 Supervision of pregnancy with other poor reproductive or obstetric history, unspecified trimester: Secondary | ICD-10-CM

## 2016-12-24 DIAGNOSIS — O09293 Supervision of pregnancy with other poor reproductive or obstetric history, third trimester: Secondary | ICD-10-CM | POA: Insufficient documentation

## 2016-12-24 DIAGNOSIS — Z3A31 31 weeks gestation of pregnancy: Secondary | ICD-10-CM | POA: Insufficient documentation

## 2017-01-07 ENCOUNTER — Encounter: Payer: Self-pay | Admitting: Family Medicine

## 2017-01-08 ENCOUNTER — Encounter (HOSPITAL_COMMUNITY): Payer: Self-pay

## 2017-01-08 ENCOUNTER — Inpatient Hospital Stay (HOSPITAL_COMMUNITY)
Admission: AD | Admit: 2017-01-08 | Discharge: 2017-01-08 | Disposition: A | Discharge status: Home / Self Care | Payer: Self-pay | Source: Ambulatory Visit | Attending: Obstetrics and Gynecology | Admitting: Obstetrics and Gynecology

## 2017-01-08 DIAGNOSIS — F17210 Nicotine dependence, cigarettes, uncomplicated: Secondary | ICD-10-CM | POA: Insufficient documentation

## 2017-01-08 DIAGNOSIS — O26893 Other specified pregnancy related conditions, third trimester: Secondary | ICD-10-CM | POA: Insufficient documentation

## 2017-01-08 DIAGNOSIS — O99333 Smoking (tobacco) complicating pregnancy, third trimester: Secondary | ICD-10-CM | POA: Insufficient documentation

## 2017-01-08 DIAGNOSIS — O09213 Supervision of pregnancy with history of pre-term labor, third trimester: Secondary | ICD-10-CM | POA: Insufficient documentation

## 2017-01-08 DIAGNOSIS — M545 Low back pain: Secondary | ICD-10-CM | POA: Insufficient documentation

## 2017-01-08 DIAGNOSIS — M549 Dorsalgia, unspecified: Secondary | ICD-10-CM

## 2017-01-08 DIAGNOSIS — O9989 Other specified diseases and conditions complicating pregnancy, childbirth and the puerperium: Secondary | ICD-10-CM

## 2017-01-08 DIAGNOSIS — R102 Pelvic and perineal pain: Secondary | ICD-10-CM | POA: Insufficient documentation

## 2017-01-08 DIAGNOSIS — O3433 Maternal care for cervical incompetence, third trimester: Secondary | ICD-10-CM | POA: Insufficient documentation

## 2017-01-08 DIAGNOSIS — Z3A33 33 weeks gestation of pregnancy: Secondary | ICD-10-CM | POA: Insufficient documentation

## 2017-01-08 LAB — URINALYSIS, ROUTINE W REFLEX MICROSCOPIC
Bilirubin Urine: NEGATIVE
GLUCOSE, UA: NEGATIVE mg/dL
Hgb urine dipstick: NEGATIVE
KETONES UR: NEGATIVE mg/dL
LEUKOCYTES UA: NEGATIVE
NITRITE: NEGATIVE
PH: 6 (ref 5.0–8.0)
Protein, ur: NEGATIVE mg/dL
SPECIFIC GRAVITY, URINE: 1.019 (ref 1.005–1.030)

## 2017-01-08 NOTE — Progress Notes (Addendum)
G5P2 @ 33.[redacted] wksga. Presents to triage for back and vaginal pain. Denies LOF or bleeding. +FM  VSS see flow sheet for details.   1817: EFM applied. Provider at bs assessing pt SVE: closed  1830: Juice and crackers given  Discharge instructions given with pt understanding. Pt left unit via ambulatory.

## 2017-01-08 NOTE — MAU Note (Signed)
Pt C/O back pain for the last 2 days, is throbbing.  Also vaginal pain, unable to close legs all the way without extreme pain & burning.  Denies bleeding or LOF.  Was supposed to get cerclage but pt was unable to get here for procedure.

## 2017-01-08 NOTE — MAU Provider Note (Signed)
  History     CSN: [id number]  Arrival date and time: 01/08/17 1650   First Provider Initiated Contact with Patient 01/08/17 1820      Chief Complaint  Patient presents with  . Back Pain  . Vaginal Pain   HPI 21 yo [id number] IUP 33 2/7 weeks presents to MAU with c/o back and vaginal pain x 3 days. Denies any vaginal bleeding or LOF. No bowel or bladder dysfunction or recent IC. Reports + FM. Denies ut ctx. Has not tried any OTC meds for relief.  [facility], complicated by IUGR and H/O PTD.  Pt declined cerclage and is not taking her progesterone. U/S on 12/24/16 appropriate growth and nl dopplers. Has repeat on 01/15/17. Missed ROB yesterday.    Past Medical History:  Diagnosis Date  . Cervical incompetence with baby delivered in second trimester 12/04/2012   Needs cerclage and close follow up with subsequent pregnancies   . Headache(784.0)    MIGRAINES NO MEDS    Past Surgical History:  Procedure Laterality Date  . CERVICAL CERCLAGE N/A 08/03/2013   Procedure: CERCLAGE CERVICAL;  Surgeon: Reva Boresanya S [name], MD;  Location: [facility];  Service: Gynecology;  Laterality: N/A;  . DILATION AND EVACUATION N/A 12/04/2012   Procedure: DILATATION AND EVACUATION;  Surgeon: Tereso NewcomerUgonna A Anyanwu, MD;  Location: [facility];  Service: Gynecology;  Laterality: N/A;  . NO PAST SURGERIES      No family history on file.  Social History  Substance Use Topics  . Smoking status: Current Every Day Smoker    Packs/day: 0.25    Years: 6.00    Types: Cigarettes  . Smokeless tobacco: Never Used  . Alcohol use No    Allergies: No Known Allergies  Prescriptions Prior to Admission  Medication Sig Dispense Refill Last Dose  . acetaminophen (TYLENOL) 500 MG tablet Take 1,000 mg by mouth daily as needed for mild pain, moderate pain, fever or headache.    Taking  . progesterone (PROMETRIUM) 100 MG capsule Place 1 capsule (100 mg total) vaginally daily. Insert at bedtime. Stop at 37 weeks. (Patient  not taking: Reported on 12/24/2016) 30 capsule 3 Not Taking    Review of Systems Physical Exam   Blood pressure (!) 83/61, pulse 72, temperature 97.7 F (36.5 C), temperature source Oral, resp. rate 18, last menstrual period 05/20/2016, unknown if currently breastfeeding.  Physical Exam  Cardiovascular: Normal rate, regular rhythm and normal heart sounds.   Respiratory: Effort normal and breath sounds normal.  GI: Soft. Bowel sounds are normal.  Gravid, FH = dates  Genitourinary:  Genitourinary Comments: Cervix closed, vertex    MAU Course  Procedures RNST obtained, no evidence of ut ctx or labor   Assessment and Plan  IUP 33 2/7 weeks Back pain in pregnancy  Pt observed in MAU reactive NST obtained, no evidence of ut ctx. Pt discharged home with instructions for back pain in pregnancy. Encouraged to reschedule ROB in clinic. PTL precautions.   Hermina StaggersMichael L Jameela Michna 01/08/2017, 6:22 PM

## 2017-01-08 NOTE — Discharge Instructions (Signed)
Preterm Labor and Birth Information Pregnancy normally lasts 39-41 weeks. Preterm labor is when labor starts early. It starts before you have been pregnant for 37 whole weeks. What are the risk factors for preterm labor? Preterm labor is more likely to occur in women who:  Have an infection while pregnant.  Have a cervix that is short.  Have gone into preterm labor before.  Have had surgery on their cervix.  Are younger than age 23.  Are older than age 23.  Are African American.  Are pregnant with two or more babies.  Take street drugs while pregnant.  Smoke while pregnant.  Do not gain enough weight while pregnant.  Got pregnant right after another pregnancy.  What are the symptoms of preterm labor? Symptoms of preterm labor include:  Cramps. The cramps may feel like the cramps some women get during their period. The cramps may happen with watery poop (diarrhea).  Pain in the belly (abdomen).  Pain in the lower back.  Regular contractions or tightening. It may feel like your belly is getting tighter.  Pressure in the lower belly that seems to get stronger.  More fluid (discharge) leaking from the vagina. The fluid may be watery or bloody.  Water breaking.  Why is it important to notice signs of preterm labor? Babies who are born early may not be fully developed. They have a higher chance for:  Long-term heart problems.  Long-term lung problems.  Trouble controlling body systems, like breathing.  Bleeding in the brain.  A condition called cerebral palsy.  Learning difficulties.  Death.  These risks are highest for babies who are born before 34 weeks of pregnancy. How is preterm labor treated? Treatment depends on:  How long you were pregnant.  Your condition.  The health of your baby.  Treatment may involve:  Having a stitch (suture) placed in your cervix. When you give birth, your cervix opens so the baby can come out. The stitch keeps the  cervix from opening too soon.  Staying at the hospital.  Taking or getting medicines, such as: ? Hormone medicines. ? Medicines to stop contractions. ? Medicines to help the babys lungs develop. ? Medicines to prevent your baby from having cerebral palsy.  What should I do if I am in preterm labor? If you think you are going into labor too soon, call your doctor right away. How can I prevent preterm labor?  Do not use any tobacco products. ? Examples of these are cigarettes, chewing tobacco, and e-cigarettes. ? If you need help quitting, ask your doctor.  Do not use street drugs.  Do not use any medicines unless you ask your doctor if they are safe for you.  Talk with your doctor before taking any herbal supplements.  Make sure you gain enough weight.  Watch for infection. If you think you might have an infection, get it checked right away.  If you have gone into preterm labor before, tell your doctor. This information is not intended to replace advice given to you by your health care provider. Make sure you discuss any questions you have with your health care provider. Document Released: 09/28/2008 Document Revised: 12/13/2015 Document Reviewed: 11/23/2015 Elsevier Interactive Patient Education  2018 Else[employer]. Back Pain in Pregnancy Back pain during pregnancy is common. Back pain may be caused by several factors that are related to changes during your pregnancy. Follow these instructions at home: Managing pain, stiffness, and swelling  If directed, apply ice for sudden (acute)  back pain. ? Put ice in a plastic bag. ? Place a towel between your skin and the bag. ? Leave the ice on for 20 minutes, 2-3 times per day.  If directed, apply heat to the affected area before you exercise: ? Place a towel between your skin and the heat pack or heating pad. ? Leave the heat on for 20-30 minutes. ? Remove the heat if your skin turns bright red. This is especially important if  you are unable to feel pain, heat, or cold. You may have a greater risk of getting burned. Activity  Exercise as told by your health care provider. Exercising is the best way to prevent or manage back pain.  Listen to your body when lifting. If lifting hurts, ask for help or bend your knees. This uses your leg muscles instead of your back muscles.  Squat down when picking up something from the floor. Do not bend over.  Only use bed rest as told by your health care provider. Bed rest should only be used for the most severe episodes of back pain. Standing, Sitting, and Lying Down  Do not stand in one place for long periods of time.  Use good posture when sitting. Make sure your head rests over your shoulders and is not hanging forward. Use a pillow on your lower back if necessary.  Try sleeping on your side, preferably the left side, with a pillow or two between your legs. If you are sore after a night's rest, your bed may be too soft. A firm mattress may provide more support for your back during pregnancy. General instructions  Do not wear high heels.  Eat a healthy diet. Try to gain weight within your health care provider's recommendations.  Use a maternity girdle, elastic sling, or back brace as told by your health care provider.  Take over-the-counter and prescription medicines only as told by your health care provider.  Keep all follow-up visits as told by your health care provider. This is important. This includes any visits with any specialists, such as a physical therapist. Contact a health care provider if:  Your back pain interferes with your daily activities.  You have increasing pain in other parts of your body. Get help right away if:  You develop numbness, tingling, weakness, or problems with the use of your arms or legs.  You develop severe back pain that is not controlled with medicine.  You have a sudden change in bowel or bladder control.  You develop  shortness of breath, dizziness, or you faint.  You develop nausea, vomiting, or sweating.  You have back pain that is a rhythmic, cramping pain similar to labor pains. Labor pain is usually 1-2 minutes apart, lasts for about 1 minute, and involves a bearing down feeling or pressure in your pelvis.  You have back pain and your water breaks or you have vaginal bleeding.  You have back pain or numbness that travels down your leg.  Your back pain developed after you fell.  You develop pain on one side of your back.  You see blood in your urine.  You develop skin blisters in the area of your back pain. This information is not intended to replace advice given to you by your health care provider. Make sure you discuss any questions you have with your health care provider. Document Released: 10/10/2005 Document Revised: 12/08/2015 Document Reviewed: 03/16/2015 Elsevier Interactive Patient Education  [employer].

## 2017-01-15 ENCOUNTER — Other Ambulatory Visit (HOSPITAL_COMMUNITY): Payer: Self-pay | Admitting: Obstetrics and Gynecology

## 2017-01-15 ENCOUNTER — Encounter (HOSPITAL_COMMUNITY): Payer: Self-pay

## 2017-01-15 ENCOUNTER — Encounter: Payer: Self-pay | Admitting: Advanced Practice Midwife

## 2017-01-15 ENCOUNTER — Ambulatory Visit (HOSPITAL_COMMUNITY)
Admission: RE | Admit: 2017-01-15 | Discharge: 2017-01-15 | Disposition: A | Discharge status: Home / Self Care | Payer: Medicaid Other | Source: Ambulatory Visit | Attending: Family Medicine | Admitting: Family Medicine

## 2017-01-15 DIAGNOSIS — O365930 Maternal care for other known or suspected poor fetal growth, third trimester, not applicable or unspecified: Secondary | ICD-10-CM | POA: Insufficient documentation

## 2017-01-15 DIAGNOSIS — O09299 Supervision of pregnancy with other poor reproductive or obstetric history, unspecified trimester: Secondary | ICD-10-CM

## 2017-01-15 DIAGNOSIS — O99333 Smoking (tobacco) complicating pregnancy, third trimester: Secondary | ICD-10-CM | POA: Insufficient documentation

## 2017-01-15 DIAGNOSIS — Z3A34 34 weeks gestation of pregnancy: Secondary | ICD-10-CM | POA: Insufficient documentation

## 2017-01-15 DIAGNOSIS — O0993 Supervision of high risk pregnancy, unspecified, third trimester: Secondary | ICD-10-CM

## 2017-01-17 ENCOUNTER — Other Ambulatory Visit (HOSPITAL_COMMUNITY): Payer: Self-pay | Admitting: *Deleted

## 2017-01-22 ENCOUNTER — Other Ambulatory Visit (HOSPITAL_COMMUNITY): Payer: Self-pay | Admitting: Maternal and Fetal Medicine

## 2017-01-22 ENCOUNTER — Encounter (HOSPITAL_COMMUNITY): Payer: Self-pay

## 2017-01-22 ENCOUNTER — Ambulatory Visit (HOSPITAL_COMMUNITY)
Admission: RE | Admit: 2017-01-22 | Discharge: 2017-01-22 | Disposition: A | Discharge status: Home / Self Care | Payer: Medicaid Other | Source: Ambulatory Visit | Attending: Family Medicine | Admitting: Family Medicine

## 2017-01-22 DIAGNOSIS — Z720 Tobacco use: Secondary | ICD-10-CM | POA: Insufficient documentation

## 2017-01-22 DIAGNOSIS — O365930 Maternal care for other known or suspected poor fetal growth, third trimester, not applicable or unspecified: Secondary | ICD-10-CM | POA: Diagnosis not present

## 2017-01-22 DIAGNOSIS — Z3A35 35 weeks gestation of pregnancy: Secondary | ICD-10-CM

## 2017-01-22 DIAGNOSIS — O99333 Smoking (tobacco) complicating pregnancy, third trimester: Secondary | ICD-10-CM | POA: Insufficient documentation

## 2017-01-22 DIAGNOSIS — O09293 Supervision of pregnancy with other poor reproductive or obstetric history, third trimester: Secondary | ICD-10-CM | POA: Diagnosis not present

## 2017-01-29 ENCOUNTER — Other Ambulatory Visit (HOSPITAL_COMMUNITY): Payer: Self-pay | Admitting: Maternal and Fetal Medicine

## 2017-01-29 ENCOUNTER — Encounter (HOSPITAL_COMMUNITY): Payer: Self-pay

## 2017-01-29 ENCOUNTER — Ambulatory Visit (HOSPITAL_COMMUNITY)
Admission: RE | Admit: 2017-01-29 | Discharge: 2017-01-29 | Disposition: A | Discharge status: Home / Self Care | Payer: Medicaid Other | Source: Ambulatory Visit | Attending: Family Medicine | Admitting: Family Medicine

## 2017-01-29 DIAGNOSIS — O365930 Maternal care for other known or suspected poor fetal growth, third trimester, not applicable or unspecified: Secondary | ICD-10-CM | POA: Insufficient documentation

## 2017-01-29 DIAGNOSIS — O99333 Smoking (tobacco) complicating pregnancy, third trimester: Secondary | ICD-10-CM

## 2017-01-29 DIAGNOSIS — O365990 Maternal care for other known or suspected poor fetal growth, unspecified trimester, not applicable or unspecified: Secondary | ICD-10-CM

## 2017-01-29 DIAGNOSIS — Z3A36 36 weeks gestation of pregnancy: Secondary | ICD-10-CM

## 2017-01-29 DIAGNOSIS — O09299 Supervision of pregnancy with other poor reproductive or obstetric history, unspecified trimester: Secondary | ICD-10-CM

## 2017-01-29 DIAGNOSIS — O09293 Supervision of pregnancy with other poor reproductive or obstetric history, third trimester: Secondary | ICD-10-CM | POA: Diagnosis present

## 2017-01-30 ENCOUNTER — Ambulatory Visit (HOSPITAL_COMMUNITY): Payer: Self-pay

## 2017-01-31 ENCOUNTER — Encounter: Payer: Self-pay | Admitting: Advanced Practice Midwife

## 2017-02-05 ENCOUNTER — Other Ambulatory Visit (HOSPITAL_COMMUNITY): Payer: Self-pay | Admitting: Maternal and Fetal Medicine

## 2017-02-05 ENCOUNTER — Ambulatory Visit (HOSPITAL_COMMUNITY)
Admission: RE | Admit: 2017-02-05 | Discharge: 2017-02-05 | Disposition: A | Discharge status: Home / Self Care | Payer: Medicaid Other | Source: Ambulatory Visit | Attending: Family Medicine | Admitting: Family Medicine

## 2017-02-05 ENCOUNTER — Encounter (HOSPITAL_COMMUNITY): Payer: Self-pay

## 2017-02-05 DIAGNOSIS — O09293 Supervision of pregnancy with other poor reproductive or obstetric history, third trimester: Secondary | ICD-10-CM | POA: Diagnosis present

## 2017-02-05 DIAGNOSIS — O99333 Smoking (tobacco) complicating pregnancy, third trimester: Secondary | ICD-10-CM

## 2017-02-05 DIAGNOSIS — Z3A37 37 weeks gestation of pregnancy: Secondary | ICD-10-CM

## 2017-02-05 DIAGNOSIS — O0993 Supervision of high risk pregnancy, unspecified, third trimester: Secondary | ICD-10-CM

## 2017-02-05 DIAGNOSIS — O09299 Supervision of pregnancy with other poor reproductive or obstetric history, unspecified trimester: Secondary | ICD-10-CM

## 2017-02-05 DIAGNOSIS — O365930 Maternal care for other known or suspected poor fetal growth, third trimester, not applicable or unspecified: Secondary | ICD-10-CM | POA: Insufficient documentation

## 2017-02-12 ENCOUNTER — Ambulatory Visit (HOSPITAL_COMMUNITY): Admission: RE | Admit: 2017-02-12 | Payer: Medicaid Other | Source: Ambulatory Visit

## 2017-02-13 ENCOUNTER — Encounter: Payer: Self-pay | Admitting: Obstetrics and Gynecology

## 2017-02-13 ENCOUNTER — Other Ambulatory Visit (HOSPITAL_COMMUNITY)
Admission: RE | Admit: 2017-02-13 | Discharge: 2017-02-13 | Disposition: A | Discharge status: Home / Self Care | Payer: Medicaid Other | Source: Ambulatory Visit | Attending: Obstetrics and Gynecology | Admitting: Obstetrics and Gynecology

## 2017-02-13 ENCOUNTER — Other Ambulatory Visit: Payer: Self-pay | Admitting: Obstetrics and Gynecology

## 2017-02-13 ENCOUNTER — Ambulatory Visit (HOSPITAL_COMMUNITY)
Admission: RE | Admit: 2017-02-13 | Discharge: 2017-02-13 | Disposition: A | Discharge status: Home / Self Care | Payer: Medicaid Other | Source: Ambulatory Visit | Attending: Maternal and Fetal Medicine | Admitting: Maternal and Fetal Medicine

## 2017-02-13 ENCOUNTER — Encounter (HOSPITAL_COMMUNITY): Payer: Self-pay

## 2017-02-13 ENCOUNTER — Ambulatory Visit (INDEPENDENT_AMBULATORY_CARE_PROVIDER_SITE_OTHER): Payer: Medicaid Other | Admitting: Obstetrics and Gynecology

## 2017-02-13 VITALS — BP 103/62 | HR 85 | Wt 198.4 lb

## 2017-02-13 DIAGNOSIS — O365930 Maternal care for other known or suspected poor fetal growth, third trimester, not applicable or unspecified: Secondary | ICD-10-CM | POA: Diagnosis not present

## 2017-02-13 DIAGNOSIS — O09293 Supervision of pregnancy with other poor reproductive or obstetric history, third trimester: Secondary | ICD-10-CM | POA: Diagnosis not present

## 2017-02-13 DIAGNOSIS — Z87898 Personal history of other specified conditions: Secondary | ICD-10-CM

## 2017-02-13 DIAGNOSIS — Z3A38 38 weeks gestation of pregnancy: Secondary | ICD-10-CM | POA: Diagnosis not present

## 2017-02-13 DIAGNOSIS — O99613 Diseases of the digestive system complicating pregnancy, third trimester: Secondary | ICD-10-CM | POA: Insufficient documentation

## 2017-02-13 DIAGNOSIS — O99333 Smoking (tobacco) complicating pregnancy, third trimester: Secondary | ICD-10-CM | POA: Diagnosis not present

## 2017-02-13 DIAGNOSIS — K219 Gastro-esophageal reflux disease without esophagitis: Secondary | ICD-10-CM | POA: Insufficient documentation

## 2017-02-13 DIAGNOSIS — O0993 Supervision of high risk pregnancy, unspecified, third trimester: Secondary | ICD-10-CM | POA: Insufficient documentation

## 2017-02-13 MED ORDER — RANITIDINE HCL 150 MG PO TABS: 150.0000 mg | ORAL_TABLET | Freq: Two times a day (BID) | ORAL | 0 refills | Status: DC

## 2017-02-13 NOTE — Progress Notes (Signed)
28 week and 36 week labs today

## 2017-02-13 NOTE — Progress Notes (Signed)
Prenatal Visit Note Date: 02/13/2017 Clinic: [facility]  Subjective:  Pamela Curtis is a 21 y.o. [phone] at 3385w3d being seen today for ongoing prenatal care.  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  Patient reports GERD Contractions: Irregular. Vag. Bleeding: None.  Movement: Present. Denies leaking of fluid.   The following portions of the patient's history were reviewed and updated as appropriate: allergies, current medications, past family history, past medical history, past social history, past surgical history and problem list. Problem list updated.  Objective:   Vitals:   02/13/17 1451  BP: 103/62  Pulse: 85  Weight: 198 lb 6.4 oz (90 kg)    Fetal Status: Fetal Heart Rate (bpm): BPP   Movement: Present  Presentation: Vertex  General:  Alert, oriented and cooperative. Patient is in no acute distress.  Skin: Skin is warm and dry. No rash noted.   Cardiovascular: Normal heart rate noted  Respiratory: Normal respiratory effort, no problems with respiration noted  Abdomen: Soft, gravid, appropriate for gestational age. Pain/Pressure: Present     Pelvic:  Cervical exam performed Dilation: Fingertip Effacement (%): Thick Station: Ballotable  Extremities: Normal range of motion.  Edema: Trace  Mental Status: Normal mood and affect. Normal behavior. Normal judgment and thought content.   Urinalysis:      Assessment and Plan:  Pregnancy: [id number] at 6785w3d  1. Supervision of high risk pregnancy, antepartum, third trimester Patient doesn't have reason why she hasn't come to her PNVs. 28wk labs, GBS today. Pt desires some type of LARC - Glucose  Tolerance, 1 Hour - HIV antibody (with reflex) - RPR - CBC - Cervicovaginal ancillary only - Strep Gp B NAA  2. Supervision of high risk pregnancy in third trimester See above  3. History of poor fetal growth Set up for 39wk IOL per MFM recommendations. Had 8/8 BPP today.   Term labor symptoms and general obstetric precautions including but not limited to vaginal bleeding, contractions, leaking of fluid and fetal movement were reviewed in detail with the patient. Please refer to After Visit Summary for other counseling recommendations.  Return in about 4 weeks (around 03/13/2017) for PP visit.   Burleson BingPickens, Cloteal Isaacson, MD

## 2017-02-14 ENCOUNTER — Telehealth (HOSPITAL_COMMUNITY): Payer: Self-pay | Admitting: *Deleted

## 2017-02-14 LAB — CBC
HEMOGLOBIN: 12.1 g/dL (ref 11.1–15.9)
Hematocrit: 35.3 % (ref 34.0–46.6)
MCH: 29.1 pg (ref 26.6–33.0)
MCHC: 34.3 g/dL (ref 31.5–35.7)
MCV: 85 fL (ref 79–97)
PLATELETS: 240 10*3/uL (ref 150–379)
RBC: 4.16 x10E6/uL (ref 3.77–5.28)
RDW: 13.7 % (ref 12.3–15.4)
WBC: 6.4 10*3/uL (ref 3.4–10.8)

## 2017-02-14 LAB — GLUCOSE TOLERANCE, 1 HOUR: GLUCOSE, 1HR PP: 124 mg/dL (ref 65–199)

## 2017-02-14 LAB — RPR: RPR Ser Ql: NONREACTIVE

## 2017-02-14 LAB — HIV ANTIBODY (ROUTINE TESTING W REFLEX): HIV Screen 4th Generation wRfx: NONREACTIVE

## 2017-02-14 NOTE — Telephone Encounter (Signed)
Preadmission screen  

## 2017-02-15 LAB — STREP GP B NAA: Strep Gp B NAA: NEGATIVE

## 2017-02-15 LAB — CERVICOVAGINAL ANCILLARY ONLY
Chlamydia: NEGATIVE
Neisseria Gonorrhea: NEGATIVE

## 2017-02-17 ENCOUNTER — Inpatient Hospital Stay (HOSPITAL_COMMUNITY): Payer: Medicaid Other | Admitting: Anesthesiology

## 2017-02-17 ENCOUNTER — Inpatient Hospital Stay (HOSPITAL_COMMUNITY)
Admission: RE | Admit: 2017-02-17 | Discharge: 2017-02-19 | DRG: 775 | Disposition: A | Discharge status: Home / Self Care | Payer: Medicaid Other | Source: Ambulatory Visit | Attending: Obstetrics and Gynecology | Admitting: Obstetrics and Gynecology

## 2017-02-17 ENCOUNTER — Encounter (HOSPITAL_COMMUNITY): Payer: Self-pay

## 2017-02-17 DIAGNOSIS — O9962 Diseases of the digestive system complicating childbirth: Secondary | ICD-10-CM | POA: Diagnosis present

## 2017-02-17 DIAGNOSIS — Z87898 Personal history of other specified conditions: Secondary | ICD-10-CM

## 2017-02-17 DIAGNOSIS — K219 Gastro-esophageal reflux disease without esophagitis: Secondary | ICD-10-CM | POA: Diagnosis present

## 2017-02-17 DIAGNOSIS — Z3A39 39 weeks gestation of pregnancy: Secondary | ICD-10-CM

## 2017-02-17 DIAGNOSIS — O365930 Maternal care for other known or suspected poor fetal growth, third trimester, not applicable or unspecified: Secondary | ICD-10-CM | POA: Diagnosis present

## 2017-02-17 DIAGNOSIS — Z3493 Encounter for supervision of normal pregnancy, unspecified, third trimester: Secondary | ICD-10-CM | POA: Diagnosis present

## 2017-02-17 DIAGNOSIS — O99334 Smoking (tobacco) complicating childbirth: Secondary | ICD-10-CM | POA: Diagnosis present

## 2017-02-17 DIAGNOSIS — F17210 Nicotine dependence, cigarettes, uncomplicated: Secondary | ICD-10-CM | POA: Diagnosis present

## 2017-02-17 DIAGNOSIS — O3433 Maternal care for cervical incompetence, third trimester: Secondary | ICD-10-CM | POA: Diagnosis present

## 2017-02-17 DIAGNOSIS — Z3490 Encounter for supervision of normal pregnancy, unspecified, unspecified trimester: Secondary | ICD-10-CM | POA: Diagnosis present

## 2017-02-17 LAB — TYPE AND SCREEN
ABO/RH(D): O POS
ANTIBODY SCREEN: NEGATIVE

## 2017-02-17 LAB — CBC
HCT: 33.7 % — ABNORMAL LOW (ref 36.0–46.0)
Hemoglobin: 12.2 g/dL (ref 12.0–15.0)
MCH: 29.1 pg (ref 26.0–34.0)
MCHC: 36.2 g/dL — AB (ref 30.0–36.0)
MCV: 80.4 fL (ref 78.0–100.0)
PLATELETS: 249 10*3/uL (ref 150–400)
RBC: 4.19 MIL/uL (ref 3.87–5.11)
RDW: 13.6 % (ref 11.5–15.5)
WBC: 6 10*3/uL (ref 4.0–10.5)

## 2017-02-17 MED ORDER — EPHEDRINE 5 MG/ML INJ
10.0000 mg | INTRAVENOUS | Status: DC | PRN
  Filled 2017-02-17: qty 2

## 2017-02-17 MED ORDER — IBUPROFEN 600 MG PO TABS
600.0000 mg | ORAL_TABLET | Freq: Four times a day (QID) | ORAL | Status: DC
  Administered 2017-02-18 – 2017-02-19 (×6): 600 mg via ORAL
  Filled 2017-02-17 (×6): qty 1

## 2017-02-17 MED ORDER — LIDOCAINE HCL (PF) 1 % IJ SOLN
INTRAMUSCULAR | Status: DC | PRN
  Administered 2017-02-17 (×2): 6 mL via EPIDURAL

## 2017-02-17 MED ORDER — LACTATED RINGERS IV SOLN
INTRAVENOUS | Status: DC
  Administered 2017-02-17 (×2): via INTRAVENOUS

## 2017-02-17 MED ORDER — LIDOCAINE HCL (PF) 1 % IJ SOLN
30.0000 mL | INTRAMUSCULAR | Status: DC | PRN
  Filled 2017-02-17: qty 30

## 2017-02-17 MED ORDER — LACTATED RINGERS IV SOLN: 500.0000 mL | INTRAVENOUS | Status: DC | PRN

## 2017-02-17 MED ORDER — LACTATED RINGERS IV SOLN: 500.0000 mL | Freq: Once | INTRAVENOUS | Status: DC

## 2017-02-17 MED ORDER — ONDANSETRON HCL 4 MG PO TABS: 4.0000 mg | ORAL_TABLET | ORAL | Status: DC | PRN

## 2017-02-17 MED ORDER — FENTANYL 2.5 MCG/ML BUPIVACAINE 1/10 % EPIDURAL INFUSION (WH - ANES)
14.0000 mL/h | INTRAMUSCULAR | Status: DC | PRN
  Administered 2017-02-17 (×2): 14 mL/h via EPIDURAL
  Filled 2017-02-17: qty 100

## 2017-02-17 MED ORDER — TETANUS-DIPHTH-ACELL PERTUSSIS 5-2.5-18.5 LF-MCG/0.5 IM SUSP: 0.5000 mL | Freq: Once | INTRAMUSCULAR | Status: DC

## 2017-02-17 MED ORDER — DIPHENHYDRAMINE HCL 50 MG/ML IJ SOLN: 12.5000 mg | INTRAMUSCULAR | Status: DC | PRN

## 2017-02-17 MED ORDER — TERBUTALINE SULFATE 1 MG/ML IJ SOLN
0.2500 mg | Freq: Once | INTRAMUSCULAR | Status: DC | PRN
  Filled 2017-02-17: qty 1

## 2017-02-17 MED ORDER — ZOLPIDEM TARTRATE 5 MG PO TABS
5.0000 mg | ORAL_TABLET | Freq: Every evening | ORAL | Status: DC | PRN
Start: 2017-02-17 — End: 2017-02-19

## 2017-02-17 MED ORDER — EPHEDRINE 5 MG/ML INJ: 10.0000 mg | INTRAVENOUS | Status: DC | PRN

## 2017-02-17 MED ORDER — LACTATED RINGERS IV SOLN
500.0000 mL | Freq: Once | INTRAVENOUS | Status: AC
  Administered 2017-02-17: 500 mL via INTRAVENOUS

## 2017-02-17 MED ORDER — OXYTOCIN 40 UNITS IN LACTATED RINGERS INFUSION - SIMPLE MED: 2.5000 [IU]/h | INTRAVENOUS | Status: DC

## 2017-02-17 MED ORDER — SENNOSIDES-DOCUSATE SODIUM 8.6-50 MG PO TABS
2.0000 | ORAL_TABLET | ORAL | Status: DC
  Administered 2017-02-18 (×2): 2 via ORAL
  Filled 2017-02-17 (×2): qty 2

## 2017-02-17 MED ORDER — SOD CITRATE-CITRIC ACID 500-334 MG/5ML PO SOLN: 30.0000 mL | ORAL | Status: DC | PRN

## 2017-02-17 MED ORDER — MISOPROSTOL 25 MCG QUARTER TABLET
25.0000 ug | ORAL_TABLET | ORAL | Status: DC | PRN
  Filled 2017-02-17: qty 1

## 2017-02-17 MED ORDER — FENTANYL CITRATE (PF) 100 MCG/2ML IJ SOLN
50.0000 ug | INTRAMUSCULAR | Status: DC | PRN
  Administered 2017-02-17: 50 ug via INTRAVENOUS
  Filled 2017-02-17: qty 2

## 2017-02-17 MED ORDER — ONDANSETRON HCL 4 MG/2ML IJ SOLN
4.0000 mg | Freq: Four times a day (QID) | INTRAMUSCULAR | Status: DC | PRN
  Administered 2017-02-17: 4 mg via INTRAVENOUS
  Filled 2017-02-17: qty 2

## 2017-02-17 MED ORDER — PHENYLEPHRINE 40 MCG/ML (10ML) SYRINGE FOR IV PUSH (FOR BLOOD PRESSURE SUPPORT): 80.0000 ug | PREFILLED_SYRINGE | INTRAVENOUS | Status: DC | PRN

## 2017-02-17 MED ORDER — BENZOCAINE-MENTHOL 20-0.5 % EX AERO
1.0000 "application " | INHALATION_SPRAY | CUTANEOUS | Status: DC | PRN
  Administered 2017-02-18: 1 via TOPICAL
  Filled 2017-02-17: qty 56

## 2017-02-17 MED ORDER — ONDANSETRON HCL 4 MG/2ML IJ SOLN: 4.0000 mg | INTRAMUSCULAR | Status: DC | PRN

## 2017-02-17 MED ORDER — PHENYLEPHRINE 40 MCG/ML (10ML) SYRINGE FOR IV PUSH (FOR BLOOD PRESSURE SUPPORT)
80.0000 ug | PREFILLED_SYRINGE | INTRAVENOUS | Status: DC | PRN
  Filled 2017-02-17: qty 5
  Filled 2017-02-17 (×2): qty 10

## 2017-02-17 MED ORDER — OXYCODONE-ACETAMINOPHEN 5-325 MG PO TABS
1.0000 | ORAL_TABLET | ORAL | Status: DC | PRN
  Administered 2017-02-18 – 2017-02-19 (×2): 1 via ORAL
  Filled 2017-02-17 (×2): qty 1

## 2017-02-17 MED ORDER — COCONUT OIL OIL: 1.0000 "application " | TOPICAL_OIL | Status: DC | PRN

## 2017-02-17 MED ORDER — ACETAMINOPHEN 325 MG PO TABS: 650.0000 mg | ORAL_TABLET | ORAL | Status: DC | PRN

## 2017-02-17 MED ORDER — OXYTOCIN 40 UNITS IN LACTATED RINGERS INFUSION - SIMPLE MED
1.0000 m[IU]/min | INTRAVENOUS | Status: DC
  Administered 2017-02-17: 2 m[IU]/min via INTRAVENOUS
  Filled 2017-02-17: qty 1000

## 2017-02-17 MED ORDER — PRENATAL MULTIVITAMIN CH
1.0000 | ORAL_TABLET | Freq: Every day | ORAL | Status: DC
  Administered 2017-02-18: 1 via ORAL
  Filled 2017-02-17: qty 1

## 2017-02-17 MED ORDER — PHENYLEPHRINE 40 MCG/ML (10ML) SYRINGE FOR IV PUSH (FOR BLOOD PRESSURE SUPPORT)
80.0000 ug | PREFILLED_SYRINGE | INTRAVENOUS | Status: DC | PRN
  Administered 2017-02-17 (×2): 80 ug via INTRAVENOUS
  Filled 2017-02-17: qty 5

## 2017-02-17 MED ORDER — DIPHENHYDRAMINE HCL 25 MG PO CAPS: 25.0000 mg | ORAL_CAPSULE | Freq: Four times a day (QID) | ORAL | Status: DC | PRN

## 2017-02-17 MED ORDER — OXYCODONE-ACETAMINOPHEN 5-325 MG PO TABS
2.0000 | ORAL_TABLET | ORAL | Status: DC | PRN
  Administered 2017-02-18 (×2): 2 via ORAL
  Filled 2017-02-17 (×2): qty 2

## 2017-02-17 MED ORDER — WITCH HAZEL-GLYCERIN EX PADS: 1.0000 "application " | MEDICATED_PAD | CUTANEOUS | Status: DC | PRN

## 2017-02-17 MED ORDER — MAGNESIUM HYDROXIDE 400 MG/5ML PO SUSP: 30.0000 mL | ORAL | Status: DC | PRN

## 2017-02-17 MED ORDER — DIBUCAINE 1 % RE OINT: 1.0000 "application " | TOPICAL_OINTMENT | RECTAL | Status: DC | PRN

## 2017-02-17 MED ORDER — OXYTOCIN BOLUS FROM INFUSION
500.0000 mL | Freq: Once | INTRAVENOUS | Status: AC
  Administered 2017-02-17: 500 mL via INTRAVENOUS

## 2017-02-17 MED ORDER — SIMETHICONE 80 MG PO CHEW: 80.0000 mg | CHEWABLE_TABLET | ORAL | Status: DC | PRN

## 2017-02-17 NOTE — H&P (Signed)
OBSTETRIC ADMISSION HISTORY AND PHYSICAL  Pamela Curtis is a 21 y.o. female [phone] with IUP at 2867w0d by LMP presenting for IOL . She reports +FMs, No LOF, no VB, no blurry vision, headaches or peripheral edema, and RUQ pain.  She plans on bottle feeding. She request IUD for birth control. [facility]   Dating: By LMP --->  Estimated Date of Delivery: 02/24/17  Sono:    @[redacted]w[redacted]d , CWD, normal anatomy, cephalic presentation, , 639g, 40% EFW    Prenatal History/Complications:  Past Medical History: Past Medical History:  Diagnosis Date  . Cervical incompetence with baby delivered in second trimester 12/04/2012   Needs cerclage and close follow up with subsequent pregnancies   . Headache(784.0)    MIGRAINES NO MEDS    Past Surgical History: Past Surgical History:  Procedure Laterality Date  . CERVICAL CERCLAGE N/A 08/03/2013   Procedure: CERCLAGE CERVICAL;  Surgeon: Reva Boresanya S [name], MD;  Location: [facility]S;  Service: Gynecology;  Laterality: N/A;  . DILATION AND EVACUATION N/A 12/04/2012   Procedure: DILATATION AND EVACUATION;  Surgeon: Tereso NewcomerUgonna A Anyanwu, MD;  Location: [facility];  Service: Gynecology;  Laterality: N/A;  . NO PAST SURGERIES      Obstetrical History: OB History    Gravida Para Term Preterm AB Living   5 2 2   2 2    SAB TAB Ectopic Multiple Live Births   1 1     2       Social History: Social History   Social History  . Marital status: Single    Spouse name: N/A  . Number of children: N/A  . Years of education: N/A   Social History Main Topics  . Smoking status: Current Every Day Smoker    Packs/day: 0.25    Years: 6.00    Types: Cigarettes  . Smokeless tobacco: Never Used  . Alcohol use No  . Drug use: No  . Sexual activity: Yes    Birth control/ protection: None   Other Topics Concern  . None   Social History Narrative  . None    Family History: History reviewed. No pertinent family history.  Allergies: No Known  Allergies  Prescriptions Prior to Admission  Medication Sig Dispense Refill Last Dose  . acetaminophen (TYLENOL) 500 MG tablet Take 1,000 mg by mouth daily as needed for mild pain, moderate pain, fever or headache.    Taking  . progesterone (PROMETRIUM) 100 MG capsule Place 1 capsule (100 mg total) vaginally daily. Insert at bedtime. Stop at 37 weeks. (Patient not taking: Reported on 12/24/2016) 30 capsule 3 Not Taking  . ranitidine (ZANTAC) 150 MG tablet Take 1 tablet (150 mg total) by mouth 2 (two) times daily. 60 tablet 0      Review of Systems   All systems reviewed and negative except as stated in HPI  Blood pressure 100/69, pulse (!) 105, temperature 98 F (36.7 C), temperature source Oral, resp. rate 18, last menstrual period 05/20/2016, unknown if currently breastfeeding. General appearance: alert, cooperative and no distress Lungs: clear to auscultation bilaterally Heart: regular rate and rhythm Abdomen: soft, non-tender; bowel sounds normal Extremities: Homans sign is negative, no sign of DVT Presentation: cephalic Fetal monitoringBaseline: 150 bpm, Variability: Good {> 6 bpm) and Accelerations: Non-reactive but appropriate for gestational age Uterine activityNone     Prenatal labs: ABO, Rh: O/Positive/-- (03/15 1438) Antibody: Negative (03/15 1438) Rubella: 3.67 (03/15 1438) RPR: Non Reactive (08/01 1548)  HBsAg: Negative (03/15  1438)  HIV:    GBS: Negative (08/01 1516)  1 hr Glucola 124 Genetic screening  negative Anatomy US normal  Prenatal Transfer Tool  Maternal Diabetes: No Genetic Screening: Normal Maternal Ultrasounds/Referrals: Normal Fetal Ultrasounds or other Referrals:  None Maternal Substance Abuse:  No Significant Maternal Medications:  None Significant Maternal Lab Results: None  No results found for this or any previous visit (from the past 24 hour(s)).  Patient Active Problem List   Diagnosis Date Noted  . Encounter for induction of labor  02/17/2017  . Poor fetal growth affecting management of mother in third trimester 02/17/2017  . History of poor fetal growth 02/13/2017  . Previous preterm delivery in second trimester, antepartum 11/07/2016  . Supervision of high-risk pregnancy 09/27/2016  . Prior pregnancy complicated by IUGR, antepartum 10/29/2013  . Incompetent cervix in pregnancy, antepartum 07/30/2013  . 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    Assessment/Plan:  Pamela Curtis is a 21 y.o. [id number] at 6559w0d here for IOL for SGA/ hx of IUGR   #Labor: Initialing Pitocin  #Pain: Epidural  #FWB: Cat 1 #ID:  GBS negative #MOF: bottle #MOC:IUD - mirena  #Circ:  Yes- outpatient   Pamela MarvelKendrick C White, MD  02/17/2017, 1:55 PM

## 2017-02-17 NOTE — Progress Notes (Signed)
Called patient in regards to missing midnight induction appointment. Phone rang multiple times but did not go to voicemail.

## 2017-02-17 NOTE — Anesthesia Preprocedure Evaluation (Signed)
Anesthesia Evaluation  Patient identified by MRN, date of birth, ID band Patient awake    Reviewed: Allergy & Precautions, NPO status , Patient's Chart, lab work & pertinent test results  History of Anesthesia Complications Negative for: history of anesthetic complications  Airway Mallampati: III  TM Distance: >3 FB Neck ROM: Full    Dental  (+) Dental Advisory Given   Pulmonary Current Smoker,    breath sounds clear to auscultation       Cardiovascular negative cardio ROS   Rhythm:Regular Rate:Normal     Neuro/Psych  Headaches, negative neurological ROS     GI/Hepatic Neg liver ROS, GERD  Medicated,  Endo/Other  negative endocrine ROS  Renal/GU negative Renal ROS     Musculoskeletal   Abdominal   Peds  Hematology plt 249k   Anesthesia Other Findings   Reproductive/Obstetrics (+) Pregnancy                             Anesthesia Physical Anesthesia Plan  ASA: II  Anesthesia Plan: Epidural   Post-op Pain Management:    Induction:   PONV Risk Score and Plan: 1 and Treatment may vary due to age or medical condition  Airway Management Planned: Natural Airway  Additional Equipment:   Intra-op Plan:   Post-operative Plan:   Informed Consent: I have reviewed the patients History and Physical, chart, labs and discussed the procedure including the risks, benefits and alternatives for the proposed anesthesia with the patient or authorized representative who has indicated his/her understanding and acceptance.   Dental advisory given  Plan Discussed with:   Anesthesia Plan Comments: (Patient identified. Risks/Benefits/Options discussed with patient including but not limited to bleeding, infection, nerve damage, paralysis, failed block, incomplete pain control, headache, blood pressure changes, nausea, vomiting, reactions to medication both or allergic, itching and postpartum back  pain. Confirmed with bedside nurse the patient's most recent platelet count. Confirmed with patient that they are not currently taking any anticoagulation, have any bleeding history or any family history of bleeding disorders. Patient expressed understanding and wished to proceed. All questions were answered. )        Anesthesia Quick Evaluation

## 2017-02-17 NOTE — Anesthesia Procedure Notes (Signed)
Epidural Patient location during procedure: OB Start time: 02/17/2017 5:06 PM End time: 02/17/2017 5:30 PM  Staffing Anesthesiologist: Jairo BenJACKSON, Aydin Cavalieri Performed: anesthesiologist   Preanesthetic Checklist Completed: patient identified, surgical consent, pre-op evaluation, timeout performed, IV checked, risks and benefits discussed and monitors and equipment checked  Epidural Patient position: sitting Prep: site prepped and draped and DuraPrep Patient monitoring: blood pressure, continuous pulse ox and heart rate Approach: midline Location: L3-L4 Injection technique: LOR air  Needle:  Needle type: Tuohy  Needle gauge: 17 G Needle length: 9 cm Needle insertion depth: 4.5 cm Catheter type: closed end flexible Catheter size: 19 Gauge Catheter at skin depth: 10 cm Test dose: negative (1% lidocaine)  Assessment Events: blood not aspirated, injection not painful, no injection resistance, negative IV test and no paresthesia  Additional Notes Pt identified in Labor room.  Monitors applied. Working IV access confirmed. Sterile prep, drape lumbar spine.  1% lido local L3,4.  #17ga Touhy LOR air at 4.5 cm L 3,4, cath in easily to 10 cm skin. Test dose OK, cath dosed and infusion begun.  Patient asymptomatic, VSS, no heme aspirated, tolerated well.  Sandford Craze Neelam Tiggs, MDReason for block:procedure for pain

## 2017-02-17 NOTE — Anesthesia Pain Management Evaluation Note (Signed)
  CRNA Pain Management Visit Note  Patient: Pamela Curtis, 21 y.o., female  "Hello I am a [facility] of the anesthesia team [facility]. We have an anesthesia team available at all times to provide care throughout the hospital, including epidural management and anesthesia for C-section. I don't know your plan for the delivery whether it a natural birth, water birth, IV sedation, nitrous supplementation, doula or epidural, but we want to meet your pain goals."   1.Was your pain managed to your expectations on prior hospitalizations?   Yes   2.What is your expectation for pain management during this hospitalization?     Epidural  3.How can we help you reach that goal? Patient DIL to 1 at time of visit she would like epidural later  Record the patient's initial score and the patient's pain goal.   Pain: 6  Pain Goal: 9 [facility] wants you to be able to say your pain was always managed very well.  Rica RecordsICKELTON,Francoise Chojnowski 02/17/2017

## 2017-02-18 LAB — RPR: RPR: NONREACTIVE

## 2017-02-18 NOTE — Progress Notes (Signed)
Post Partum Day 1 Subjective: no complaints, up ad lib, voiding and tolerating PO  Objective: Blood pressure 107/60, pulse 75, temperature 98.1 F (36.7 C), temperature source Oral, resp. rate 18, height 5\' 6"  (1.676 m), weight 90.3 kg (199 lb), last menstrual period 05/20/2016, SpO2 100 %, unknown if currently breastfeeding.  Physical Exam:  General: alert, cooperative and no distress Lochia: appropriate Uterine Fundus: firm Incision: n/a DVT Evaluation: No evidence of DVT seen on physical exam. Negative Homan's sign. No significant calf/ankle edema.   Recent Labs  02/17/17 1357  HGB 12.2  HCT 33.7*    Assessment/Plan: Plan for discharge tomorrow and Contraception IUD   LOS: 1 day   Pamela Curtis 02/18/2017, 10:13 AM   CNM attestation Post Partum Day #1 I have seen and examined this patient and agree with above documentation in the resident's note.   Pamela Curtis is a 21 y.o. [id number] s/p SVD.  Pt denies problems with ambulating, voiding or po intake. Pain is well controlled.  Plan for birth control is IUD.  Method of Feeding: bottle  PE:  BP 101/68   Pulse 67   Temp 98.1 F (36.7 C) (Oral)   Resp 18   Ht 5\' 6"  (1.676 m)   Wt 90.3 kg (199 lb)   LMP 05/20/2016   SpO2 100%   Breastfeeding? Unknown   BMI 32.12 kg/m  Fundus firm  Plan for discharge: 02/19/17  Cam HaiSHAW, Dwayna Kentner, CNM 9:03 AM

## 2017-02-18 NOTE — Discharge Instructions (Signed)

## 2017-02-18 NOTE — Anesthesia Postprocedure Evaluation (Signed)
Anesthesia Post Note  Patient: Pamela Curtis  Procedure(s) Performed: * No procedures listed *     Patient location during evaluation: Mother Baby Anesthesia Type: Epidural Level of consciousness: awake and alert Pain management: pain level controlled Vital Signs Assessment: post-procedure vital signs reviewed and stable Respiratory status: spontaneous breathing, nonlabored ventilation and respiratory function stable Cardiovascular status: stable Postop Assessment: no headache, no backache and epidural receding Anesthetic complications: no    Last Vitals:  Vitals:   02/18/17 0030 02/18/17 0300  BP: 111/61 107/60  Pulse: 75 75  Resp: 18 18  Temp: 36.8 C 36.7 C    Last Pain:  Vitals:   02/18/17 0805  TempSrc:   PainSc: 10-Worst pain ever   Pain Goal:                 EchoStarMERRITT,Jaloni Sorber

## 2017-02-19 MED ORDER — OXYCODONE HCL 5 MG PO TABS: 10.0000 mg | ORAL_TABLET | ORAL | Status: DC | PRN

## 2017-02-19 MED ORDER — OXYCODONE HCL 5 MG PO TABS: 5.0000 mg | ORAL_TABLET | ORAL | Status: DC | PRN

## 2017-02-19 MED ORDER — OXYCODONE HCL 5 MG PO TABS
5.0000 mg | ORAL_TABLET | Freq: Once | ORAL | Status: AC
  Administered 2017-02-19: 5 mg via ORAL
  Filled 2017-02-19: qty 1

## 2017-02-19 NOTE — Progress Notes (Signed)
Patient has intermittent severe pain.   Rn called Dr. Sedalia Mutaox.  An order was given to give an oxycodone dose now. Then in four hours oxycodone can be given again. Tylenol can be given in between.

## 2017-02-19 NOTE — Discharge Summary (Signed)
OB Discharge Summary     Patient Name: Pamela Curtis DOB: 04-Jun-1996 MRN: [id number]  Date of admission: 02/17/2017 Delivering MD: Thressa Sheller D   Date of discharge: 02/19/2017  Admitting diagnosis: 39 WKS, INDUCTION Intrauterine pregnancy: [redacted]w[redacted]d     Secondary diagnosis:  Active Problems:   History of poor fetal growth   Encounter for induction of labor   Poor fetal growth affecting management of mother in third trimester      Discharge diagnosis: Term Pregnancy Delivered                                                                                                Post partum procedures:None  Augmentation: Pitocin  Complications: None  Hospital course:  Onset of Labor With Vaginal Delivery     21 y.o. yo [id number] at [redacted]w[redacted]d was admitted in Latent Labor on 02/17/2017. Patient had an uncomplicated labor course as follows:  Membrane Rupture Time/Date: 5:06 PM ,02/17/2017   Intrapartum Procedures: Episiotomy: None [1]                                         Lacerations:  None [1]  Patient had a delivery of a Viable infant. 02/17/2017  Information for the patient's newborn:  Kristalyn, Bergstresser [[id number]]  Delivery Method: Vag-Spont    Pateint had an uncomplicated postpartum course.  She is ambulating, tolerating a regular diet, passing flatus, and urinating well. Patient is discharged home in stable condition on 02/19/17.   Physical exam  Vitals:   02/18/17 0030 02/18/17 0300 02/18/17 1739 02/19/17 0607  BP: 111/61 107/60 114/66 101/68  Pulse: 75 75 70 67  Resp: 18 18 16 18   Temp: 98.2 F (36.8 C) 98.1 F (36.7 C) 97.6 F (36.4 C) 98.1 F (36.7 C)  TempSrc: Oral Oral Oral Oral  SpO2: 100% 100% 100%   Weight:      Height:       General: alert, cooperative and no distress Lochia: appropriate Uterine Fundus: firm Incision: None DVT Evaluation: No evidence of DVT seen on physical exam. Labs: Lab Results  Component Value Date   WBC 6.0 02/17/2017   HGB 12.2  02/17/2017   HCT 33.7 (L) 02/17/2017   MCV 80.4 02/17/2017   PLT 249 02/17/2017   CMP Latest Ref Rng & Units 06/27/2014  Glucose 70 - 99 mg/dL 88  BUN 6 - 23 mg/dL 18  Creatinine 6.96 - 2.95 mg/dL 2.84  Sodium 132 - 440 mEq/L 139  Potassium 3.7 - 5.3 mEq/L 3.7  Chloride 96 - 112 mEq/L 103  CO2 19 - 32 mEq/L 21  Calcium 8.4 - 10.5 mg/dL 9.4  Total Protein 6.0 - 8.3 g/dL 8.2  Total Bilirubin 0.3 - 1.2 mg/dL 0.9  Alkaline Phos 39 - 117 U/L 125(H)  AST 0 - 37 U/L 25  ALT 0 - 35 U/L 19    Discharge instruction: per After Visit Summary and "Baby and Me Booklet".  After visit meds:  Allergies as  of 02/19/2017   No Known Allergies     Medication List    STOP taking these medications   progesterone 100 MG capsule Commonly known as:  PROMETRIUM   ranitidine 150 MG tablet Commonly known as:  ZANTAC     TAKE these medications   acetaminophen 500 MG tablet Commonly known as:  TYLENOL Take 1,000 mg by mouth daily as needed for mild pain, moderate pain, fever or headache.       Diet: routine diet  Activity: Advance as tolerated. Pelvic rest for 6 weeks.   Outpatient follow up:6 weeks Follow up Appt:No future appointments. Follow up Visit:No Follow-up on file.  Postpartum contraception: IUD Mirena  Newborn Data: Live born female  Birth Weight: 6 lb 2.8 oz (2800 g) APGAR: 9, 9  Baby Feeding: Bottle Disposition:home with mother   02/19/2017 John Giovanniorey P Cox, MD   I confirm that I have verified the information documented in the resident's note and that I have also personally reperformed the physical exam and all medical decision making activities.  Thressa ShellerHeather Haddie Bruhl  7:55 AM 02/19/17

## 2017-02-19 NOTE — Progress Notes (Signed)
Patient is currently sleeping.   Patient previously had intermittent severe pain.

## 2017-02-25 ENCOUNTER — Encounter: Payer: Self-pay | Admitting: General Practice

## 2017-04-03 ENCOUNTER — Ambulatory Visit: Payer: Medicaid Other | Admitting: Obstetrics and Gynecology

## 2017-04-03 ENCOUNTER — Encounter: Payer: Self-pay | Admitting: Obstetrics and Gynecology

## 2017-05-06 IMAGING — US US OB COMP LESS 14 WK
1 series · 15 of 28 positions shown · non-contrast
Comparison: None.

CLINICAL DATA: Vaginal bleeding before 22 weeks. Unknown last
menstrual period. Cramping. G4 P2. History of cervical incompetence.

EXAM:
OBSTETRIC <14 WK US AND TRANSVAGINAL OB US
TECHNIQUE: Both transabdominal and transvaginal ultrasound examinations were
performed for complete evaluation of the gestation as well as the
maternal uterus, adnexal regions, and pelvic cul-de-sac.
Transvaginal technique was performed to assess early pregnancy.

[Series 1: us ob comp less 14 wk · 15 of 63 slices shown]
[im 1/63]
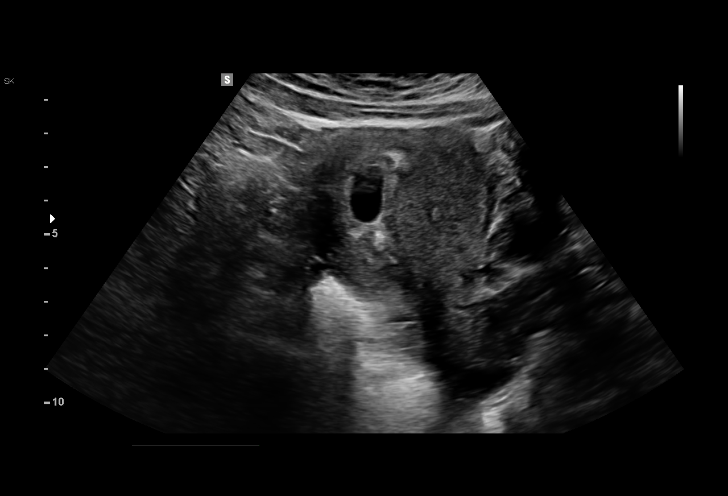
[im 5/63]
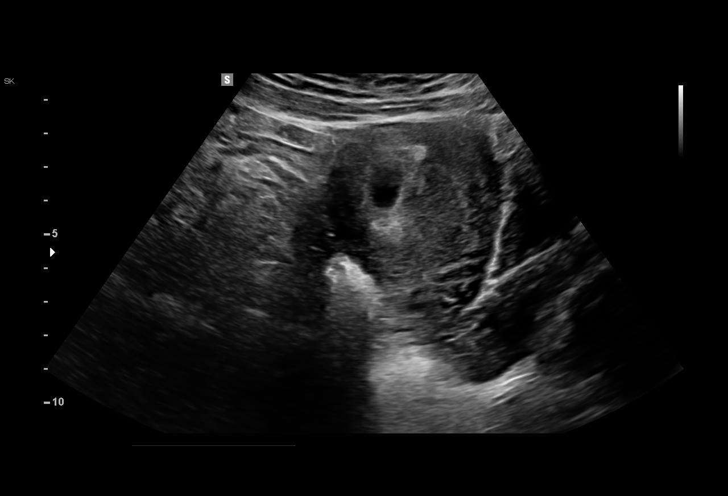
[im 10/63]
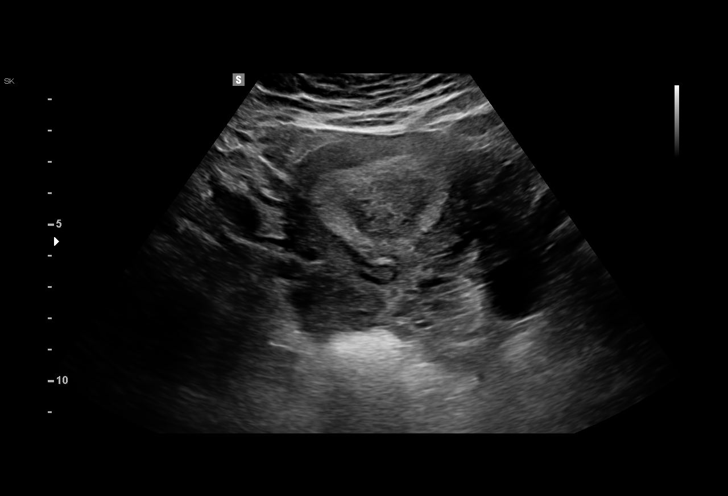
[im 14/63]
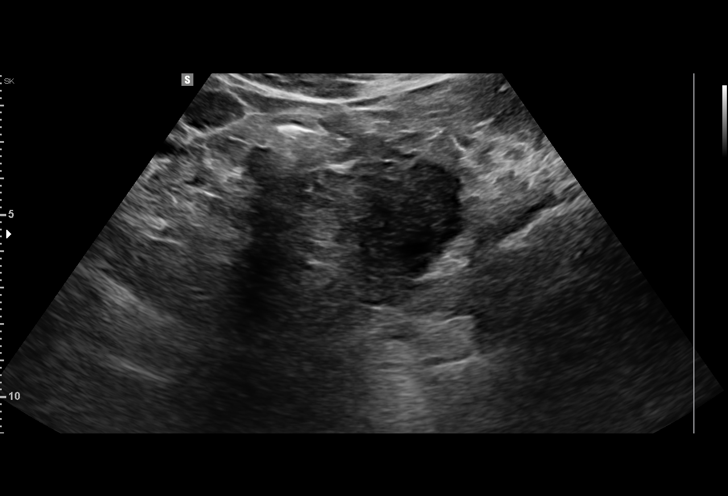
[im 19/63]
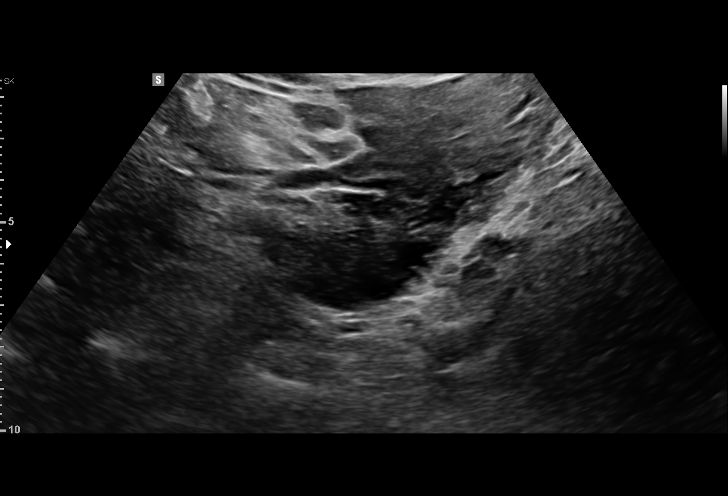
[im 23/63]
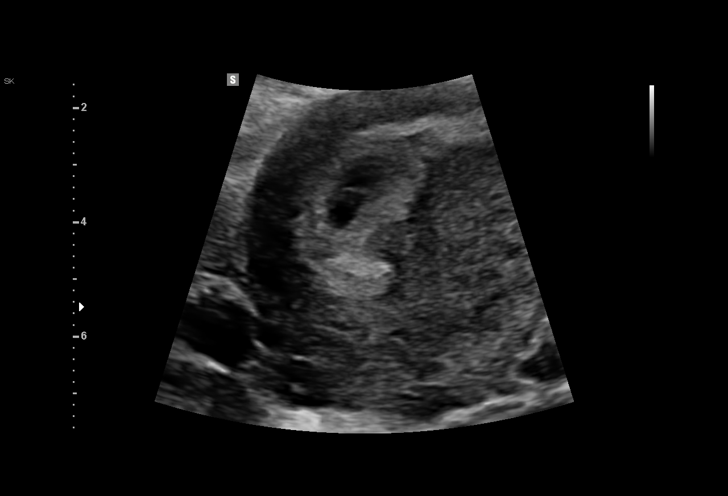
[im 28/63]
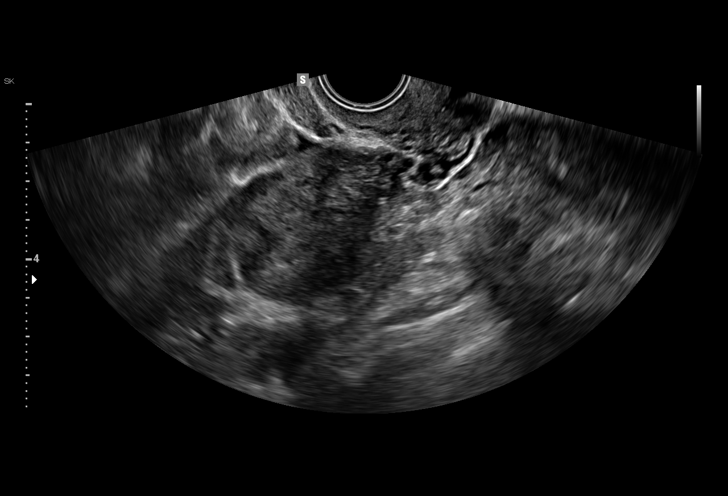
[im 33/63]
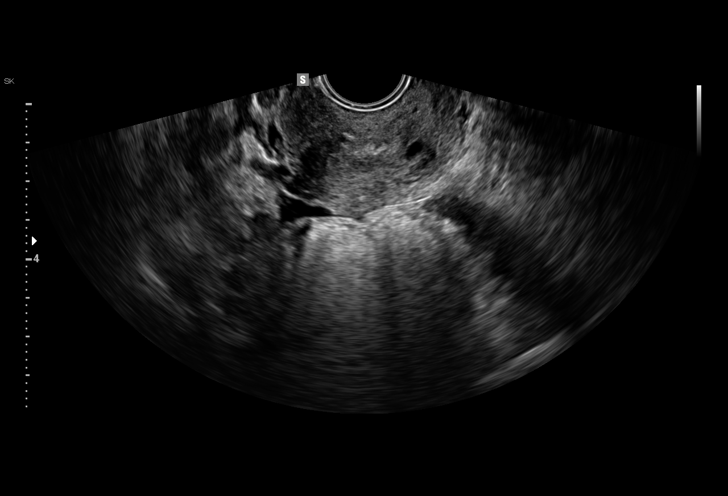
[im 35/63]
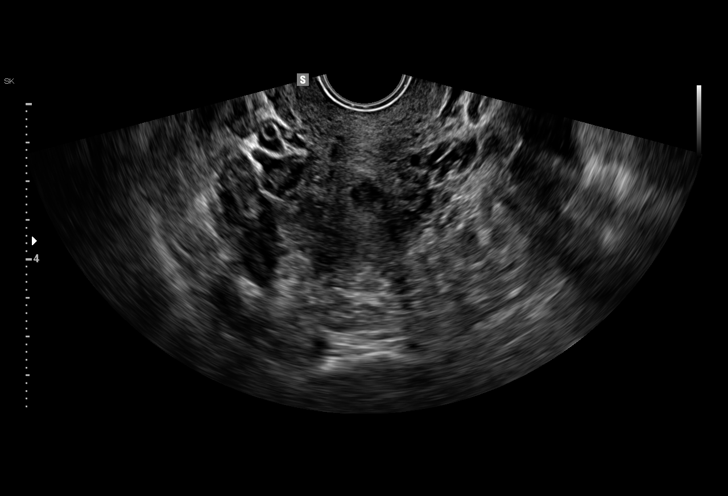
[im 40/63]
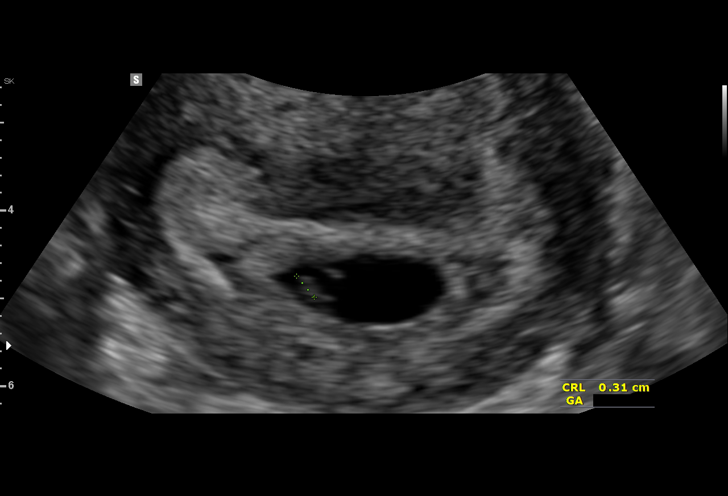
[im 44/63]
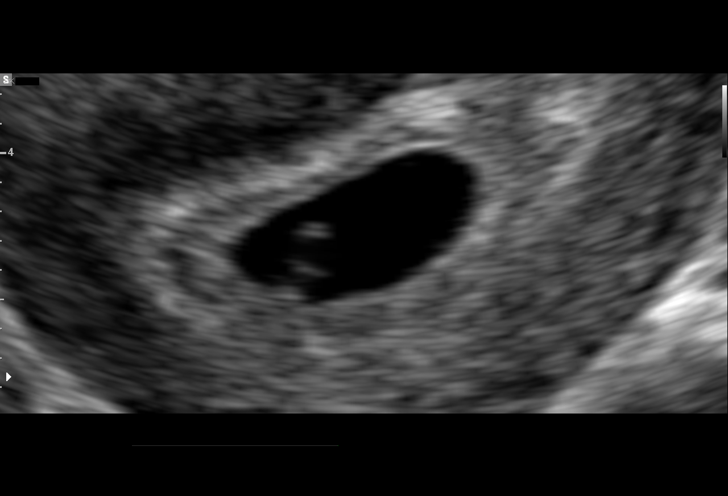
[im 49/63]
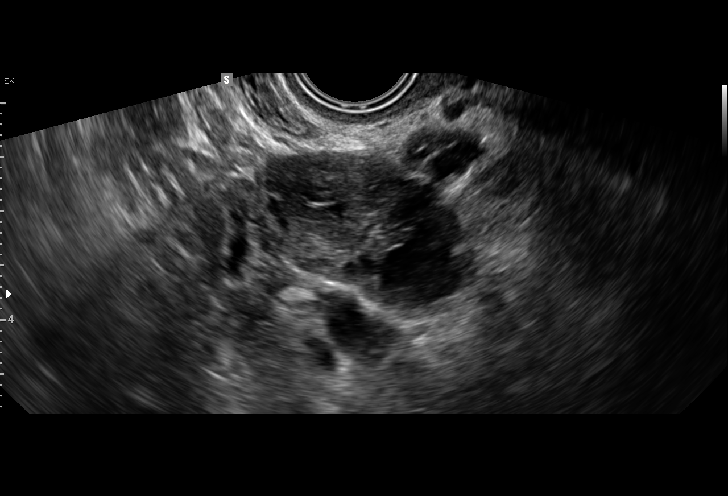
[im 53/63]
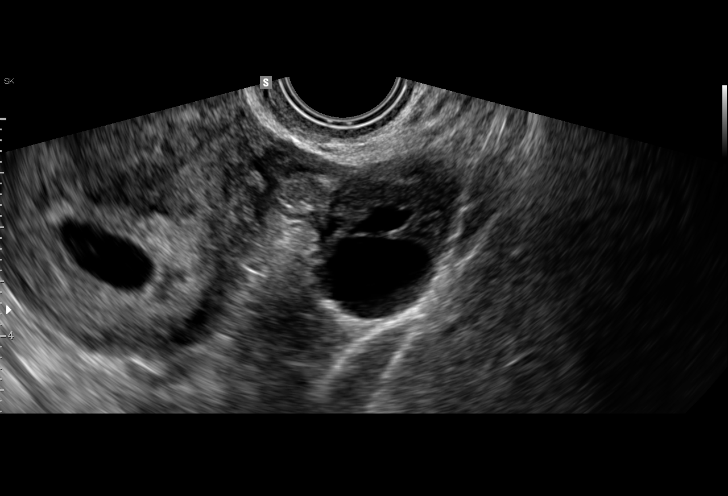
[im 58/63]
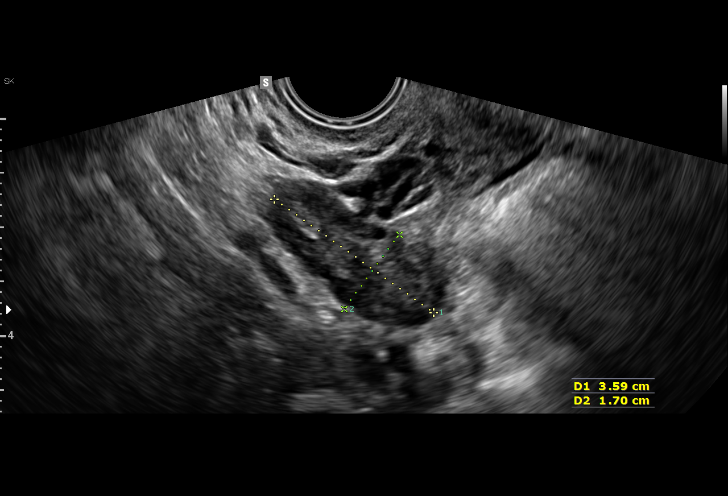
[im 63/63]
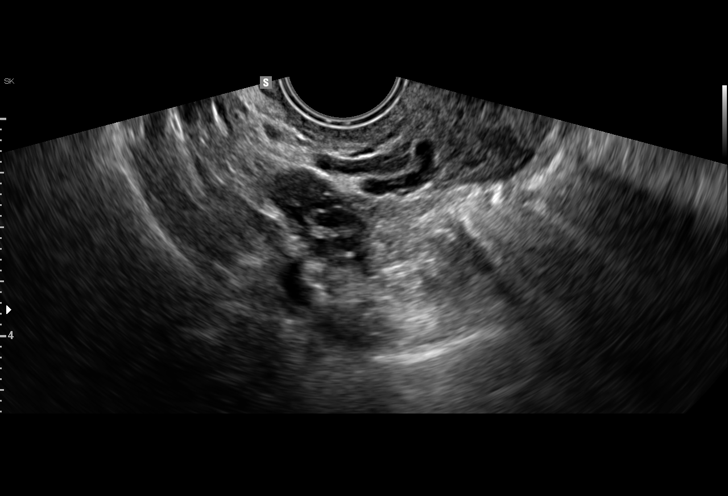

[15 of 28 positions shown; findings below may reference images not displayed]

FINDINGS: Intrauterine gestational sac: Present

Yolk sac:  Present

Embryo:  Present

Cardiac Activity: Present

Heart Rate: 120  bpm

CRL:  3  mm   5 w   6 d                  US EDC: August 03, 2016

Subchorionic hemorrhage:  None visualized.

Maternal uterus/adnexae: Normal appearance of the adnexae a. 2.4 cm
LEFT involuting cyst/dominant follicle. No free fluid.
IMPRESSION: Single live intrauterine pregnancy, gestational age by ultrasound 5
weeks and 6 days without complication.

## 2017-11-19 ENCOUNTER — Encounter: Payer: Self-pay | Admitting: *Deleted

## 2018-05-05 ENCOUNTER — Emergency Department (HOSPITAL_COMMUNITY): Admission: EM | Admit: 2018-05-05 | Discharge: 2018-05-05 | Discharge status: Against Medical Advice | Payer: Self-pay

## 2018-05-05 ENCOUNTER — Other Ambulatory Visit: Payer: Self-pay

## 2018-05-05 NOTE — ED Notes (Signed)
Called pt x3. No answer.  

## 2018-06-10 ENCOUNTER — Encounter (HOSPITAL_COMMUNITY): Payer: Self-pay

## 2018-06-10 ENCOUNTER — Emergency Department (HOSPITAL_COMMUNITY)
Admission: EM | Admit: 2018-06-10 | Discharge: 2018-06-10 | Disposition: A | Discharge status: Home / Self Care | Payer: Self-pay | Attending: Emergency Medicine | Admitting: Emergency Medicine

## 2018-06-10 ENCOUNTER — Other Ambulatory Visit: Payer: Self-pay

## 2018-06-10 DIAGNOSIS — F17210 Nicotine dependence, cigarettes, uncomplicated: Secondary | ICD-10-CM | POA: Insufficient documentation

## 2018-06-10 DIAGNOSIS — N912 Amenorrhea, unspecified: Secondary | ICD-10-CM | POA: Insufficient documentation

## 2018-06-10 LAB — POC URINE PREG, ED: Preg Test, Ur: NEGATIVE

## 2018-06-10 NOTE — Discharge Instructions (Addendum)
Please read attached information. If you experience any new or worsening signs or symptoms please return to the emergency room for evaluation. Please follow-up with your primary care provider or specialist as discussed.  °

## 2018-06-10 NOTE — ED Notes (Signed)
No answer x2 

## 2018-06-10 NOTE — ED Triage Notes (Signed)
Pt states missed period X3 months. States she has taken multiple preg tests that were all negative.

## 2018-06-10 NOTE — ED Provider Notes (Signed)
[facility] EMERGENCY DEPARTMENT Provider Note   CSN: [id number] Arrival date & time: 06/10/18  1053     History   Chief Complaint Chief Complaint  Patient presents with  . Possible Pregnancy    HPI Pamela Curtis is a 22 y.o. female.  HPI   22 year old female presents today with complaints of amenorrhea.  Patient notes her last menstrual cycle was at the beginning of September.  She notes that several months prior to that she did miss a cycle every other month.  She notes since that time she has had no vaginal bleeding.  She reports she is sexually active.  She denies any weight gain, weight loss, infectious symptoms.  She denies any new medications, is not on birth control.  She denies any swelling or pain in her abdomen or pelvis.  Past Medical History:  Diagnosis Date  . Cervical incompetence with baby delivered in second trimester 12/04/2012   Needs cerclage and close follow up with subsequent pregnancies   . Headache(784.0)    MIGRAINES NO MEDS    Patient Active Problem List   Diagnosis Date Noted  . Encounter for induction of labor 02/17/2017  . Poor fetal growth affecting management of mother in third trimester 02/17/2017  . History of poor fetal growth 02/13/2017  . Previous preterm delivery in second trimester, antepartum 11/07/2016  . Supervision of high-risk pregnancy 09/27/2016  . Prior pregnancy complicated by IUGR, antepartum 10/29/2013  . Incompetent cervix in pregnancy, antepartum 07/30/2013  . 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 CERVICAL CERCLAGE N/A 08/03/2013   Procedure: CERCLAGE CERVICAL;  Surgeon: Reva Bores, MD;  Location: [facility]S;  Service: Gynecology;  Laterality: N/A;  . DILATION AND EVACUATION N/A 12/04/2012   Procedure: DILATATION AND EVACUATION;  Surgeon: Tereso Newcomer, MD;  Location: [facility] ORS;  Service: Gynecology;  Laterality: N/A;  . NO PAST SURGERIES        OB History    Gravida  5   Para  3   Term  3   Preterm      AB  2   Living  3     SAB  1   TAB  1   Ectopic      Multiple  0   Live Births  3            Home Medications    Prior to Admission medications   Medication Sig Start Date End Date Taking? Authorizing Provider  acetaminophen (TYLENOL) 500 MG tablet Take 1,000 mg by mouth daily as needed for mild pain, moderate pain, fever or headache.     [provider]    Family History History reviewed. No pertinent family history.  Social History Social History   Tobacco Use  . Smoking status: Current Every Day Smoker    Packs/day: 0.25    Years: 6.00    Pack years: 1.50    Types: Cigarettes  . Smokeless tobacco: Never Used  Substance Use Topics  . Alcohol use: No  . Drug use: No     Allergies   Patient has no known allergies.   Review of Systems Review of Systems  All other systems reviewed and are negative.    Physical Exam Updated Vital Signs BP 106/76 (BP Location: Right Arm)   Pulse 100   Temp 98.4 F (36.9 C) (Oral)   Resp 18   Ht 5\' 4"  (1.626 m)  Wt 90.7 kg   SpO2 96%   BMI 34.33 kg/m   Physical Exam  Constitutional: She is oriented to person, place, and time. She appears well-developed and well-nourished.  HENT:  Head: Normocephalic and atraumatic.  Eyes: Pupils are equal, round, and reactive to light. Conjunctivae are normal. Right eye exhibits no discharge. Left eye exhibits no discharge. No scleral icterus.  Neck: Normal range of motion. No JVD present. No tracheal deviation present.  Pulmonary/Chest: Effort normal. No stridor.  Abdominal:  Abdomen soft nontender no distention  Neurological: She is alert and oriented to person, place, and time. Coordination normal.  Psychiatric: She has a normal mood and affect. Her behavior is normal. Judgment and thought content normal.  Nursing note and vitals reviewed.    ED Treatments / Results  Labs (all labs  ordered are listed, but only abnormal results are displayed) Labs Reviewed  POC URINE PREG, ED    EKG None  Radiology No results found.  Procedures Procedures (including critical care time)  Medications Ordered in ED Medications - No data to display   Initial Impression / Assessment and Plan / ED Course  I have reviewed the triage vital signs and the nursing notes.  Pertinent labs & imaging results that were available during my care of the patient were reviewed by me and considered in my medical decision making (see chart for details).     22 year old female presents today with amenorrhea.  She is well-appearing no acute distress.  She will be referred to OB/GYN for further evaluation and management.  Strict return precautions given.  She verbalized understanding and agreement to today's plan.  Final Clinical Impressions(s) / ED Diagnoses   Final diagnoses:  Amenorrhea    ED Discharge Orders    None       Eyvonne MechanicHedges, Taylee Gunnells, PA-C 06/10/18 1309    Melene PlanFloyd, Dan, DO 06/10/18 1314

## 2018-06-21 IMAGING — US US MFM FETAL BPP W/O NON-STRESS
1 series · 13 of 28 positions shown · non-contrast
Comparison: none

[facility] Clinic
[REDACTED]

1  BLAIN JUMPER            [id number]      [id number]     [id number]
2  BLAIN JUMPER            [id number]      [id number]     [id number]
Indications
35 weeks gestation of pregnancy
Poor obstetric history: Previous fetal growth
restriction (FGR)
Poor obstetric history: Previous midtrimester
loss (18 weeks)
Maternal care for known or suspected poor
fetal growth, third trimester, not applicable or
unspecified
Tobacco use complicating pregnancy, third
trimester
OB History
Blood Type:            Height:  5'5"   Weight (lb):  181      BMI:
Gravidity:    5         Term:   2        Prem:   0        SAB:   1
TOP:          1       Ectopic:  0        Living: 2
Fetal Evaluation
Num Of Fetuses:     1
Fetal Heart         129
Rate(bpm):
Cardiac Activity:   Observed
Presentation:       Cephalic
Placenta:           Fundal, above cervical os
Amniotic Fluid
AFI FV:      Subjectively within normal limits
AFI Sum(cm)     %Tile       Largest Pocket(cm)
13.68           48
RUQ(cm)       RLQ(cm)       LUQ(cm)        LLQ(cm)
3.9
Biophysical Evaluation
Amniotic F.V:   Pocket => 2 cm two         F. Tone:        Observed
planes
F. Movement:    Observed                   Score:          [DATE]
F. Breathing:   Observed
Gestational Age
LMP:           35w 2d       Date:   05/20/16                 EDD:   02/24/17
Best:          35w 2d    Det. By:   LMP  (05/20/16)          EDD:   02/24/17
Doppler - Fetal Vessels
Umbilical Artery
S/D     %tile     RI              PI              PSV    ADFV    RDFV
(cm/s)
3.15       84   0.68             1.14             53.18      No      No
Impression
INDICATION: 20 yr old FWSCKCC at 86w4d with lagging fetal
growth for BPP and Doppler studies.

[Series 1: us mfm fetal bpp w/o non-stress · 32 acquisitions, 13 frames shown]
[im 2/32]
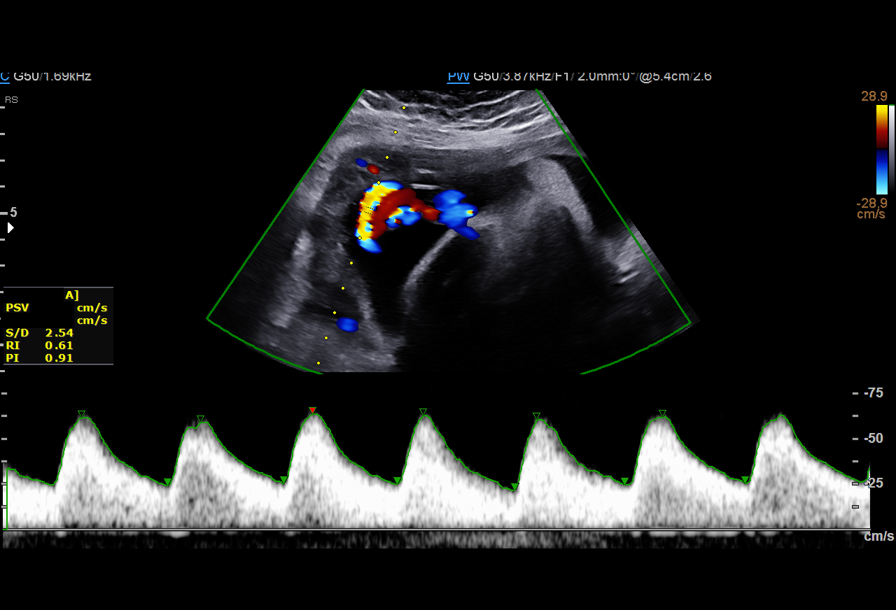
[im 4/32]
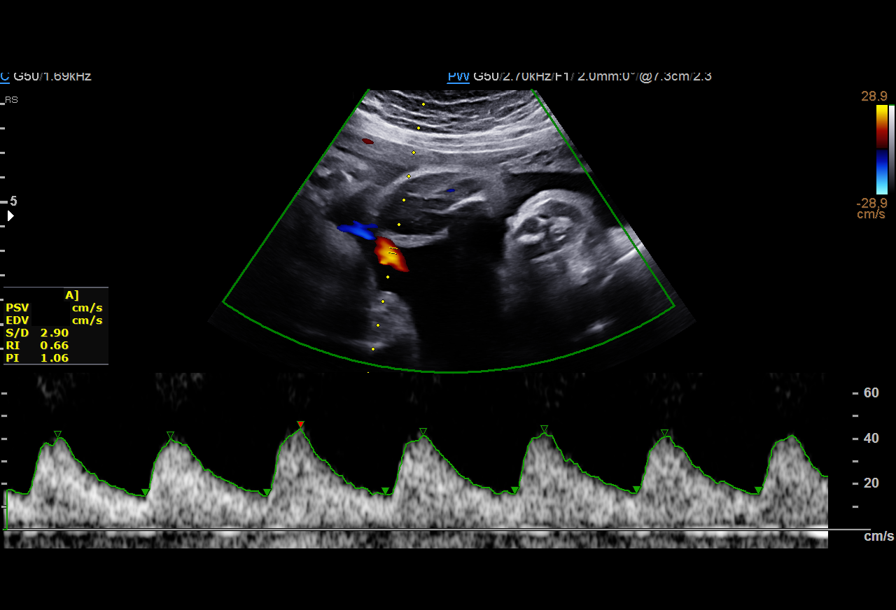
[im 6/32]
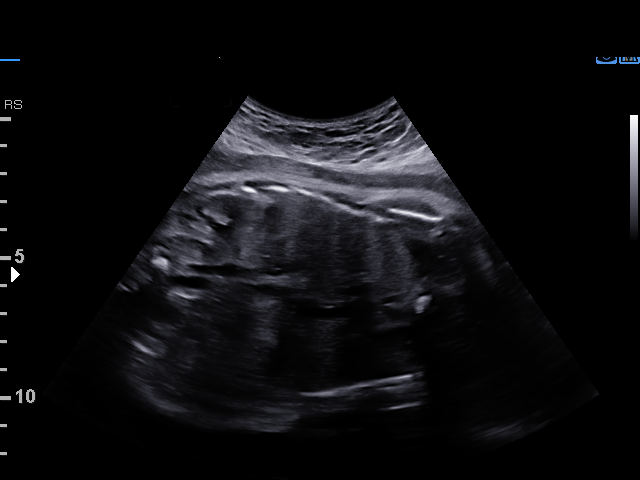
[im 9/32]
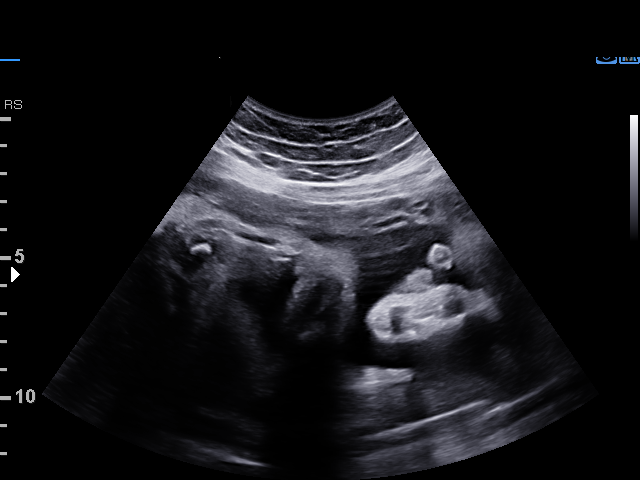
[im 11/32]
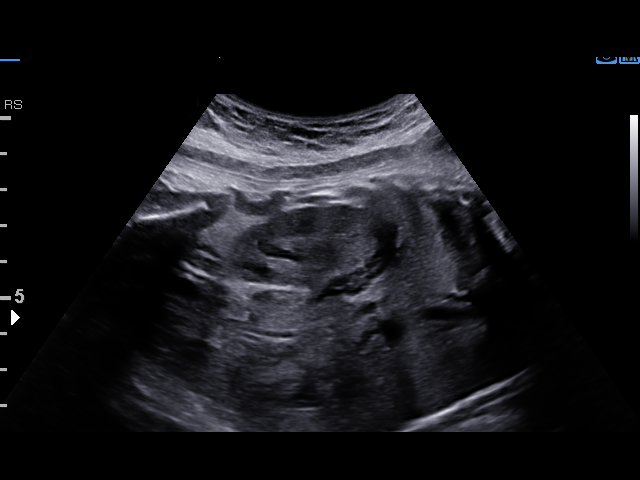
[im 13/32]
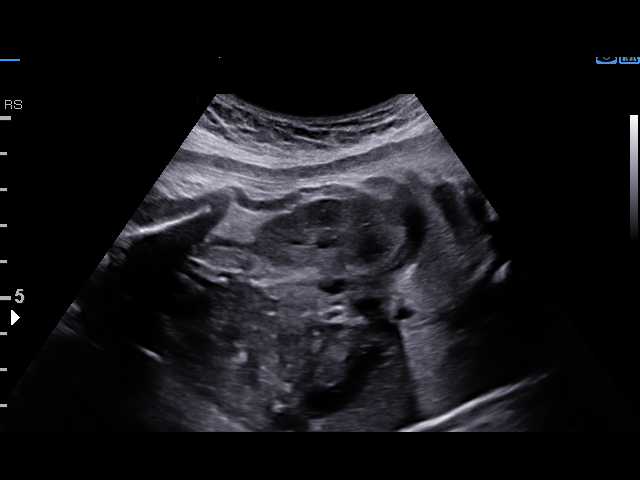
[im 17/32]
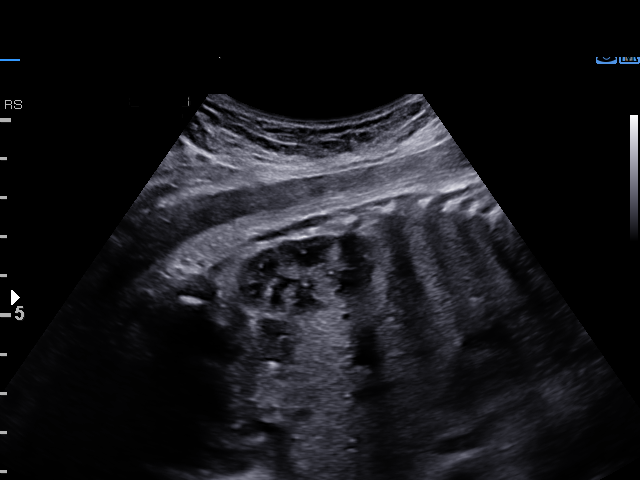
[im 19/32]
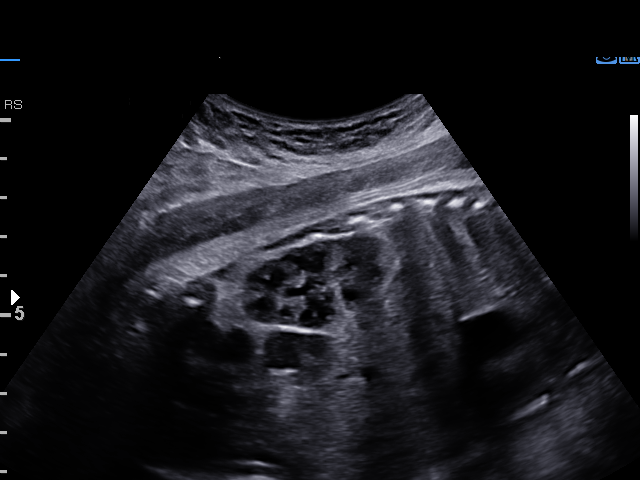
[im 21/32]
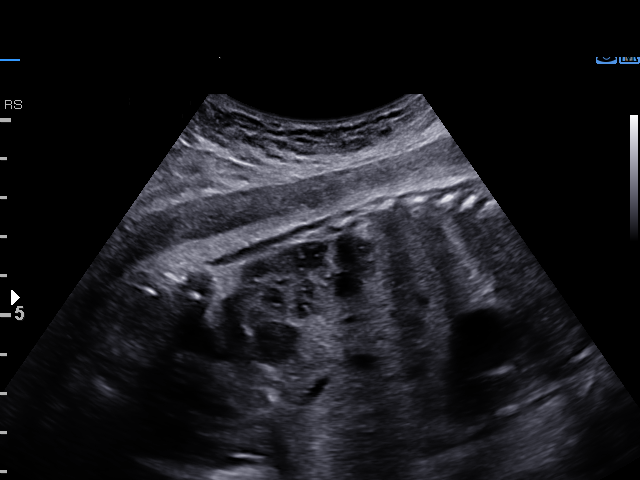
[im 23/32]
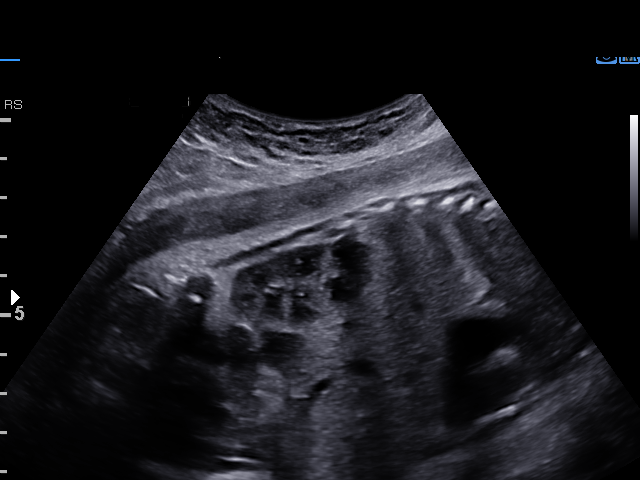
[im 26/32]
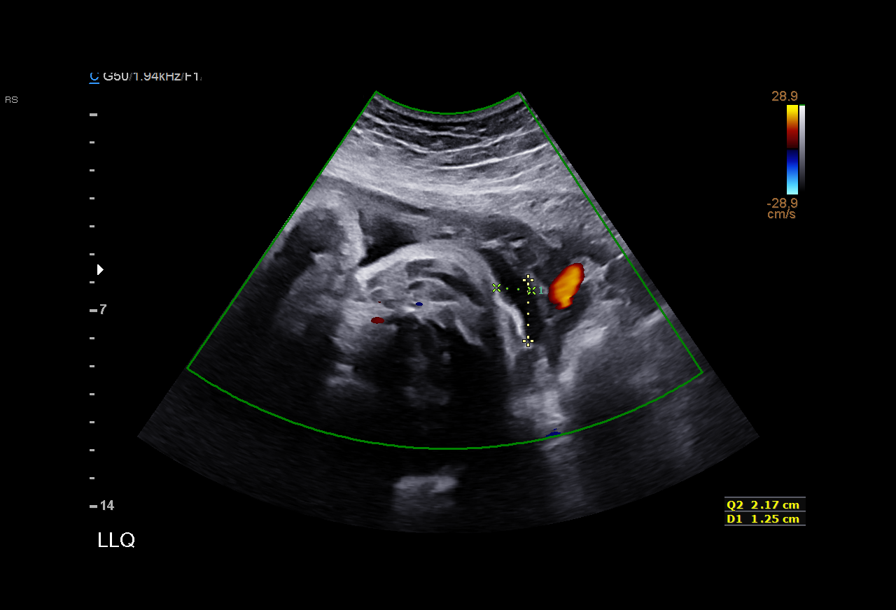
[im 28/32]
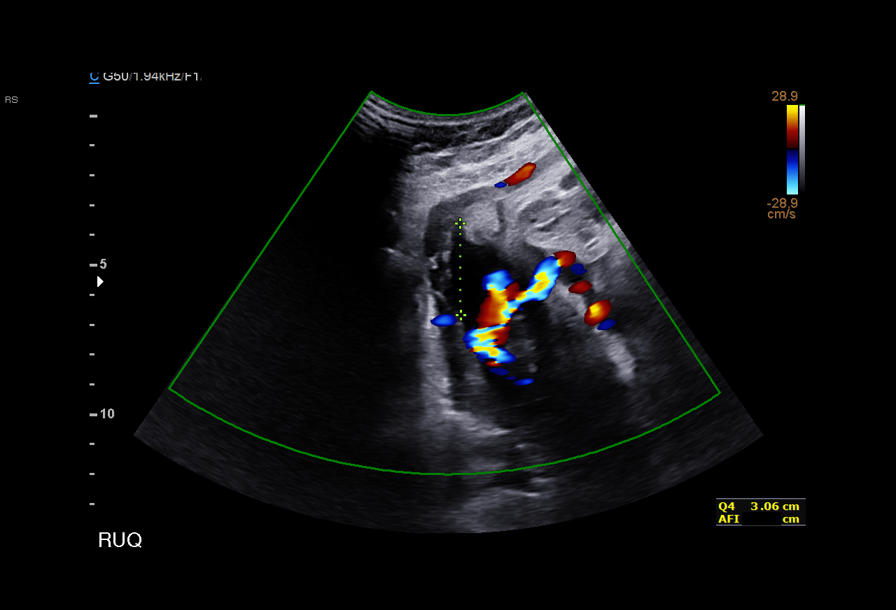
[im 30/32]
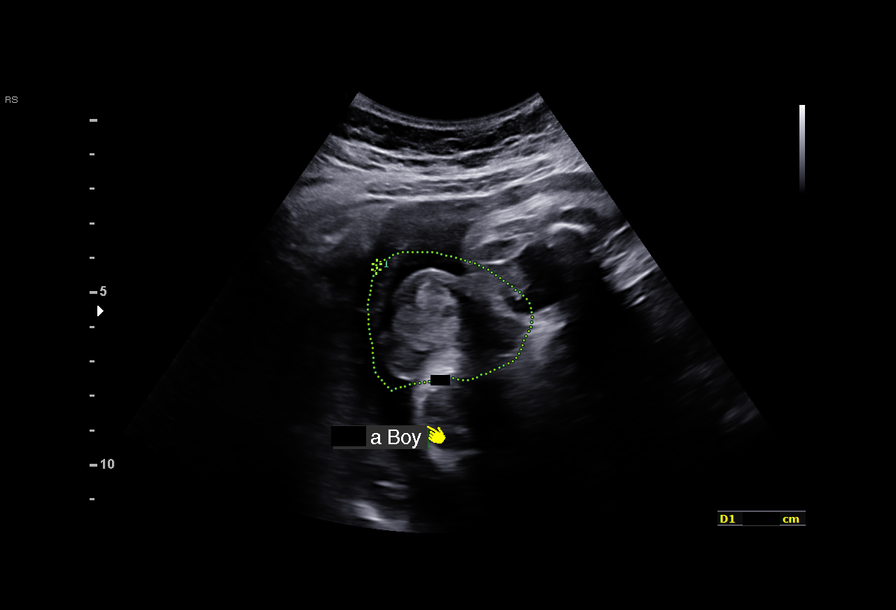

[13 of 28 positions shown; findings below may reference images not displayed]

FINDINGS: 1. Single intrauterine pregnancy.
2. Fundal placenta without evidence of previa.
3. Normal amniotic fluid index.
4. Normal umbilical artery Doppler studies.
5. Normal biophysical profile of [DATE].
Recommendations

1. Lagging fetal growth:
- previously counseled
- recommend fetal growth in 2 weeks
- recommend fetal Emerson Hyder
- continue antenatal testing and Doppler studies per previous
recommendations
2. Normal quad screen

## 2018-06-23 ENCOUNTER — Ambulatory Visit: Payer: Self-pay | Admitting: Advanced Practice Midwife

## 2018-07-05 IMAGING — US US MFM FETAL BPP W/O NON-STRESS
2 series · 13 of 28 positions shown · non-contrast
Comparison: none

[Series 1: us mfm fetal bpp w/o non-stress · 63 acquisitions, 12 frames shown (1 of 2)]
[im 3/63]
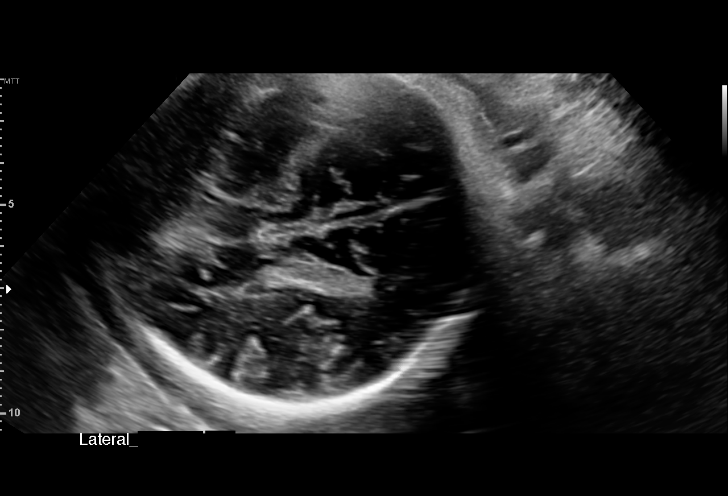
[im 8/63]
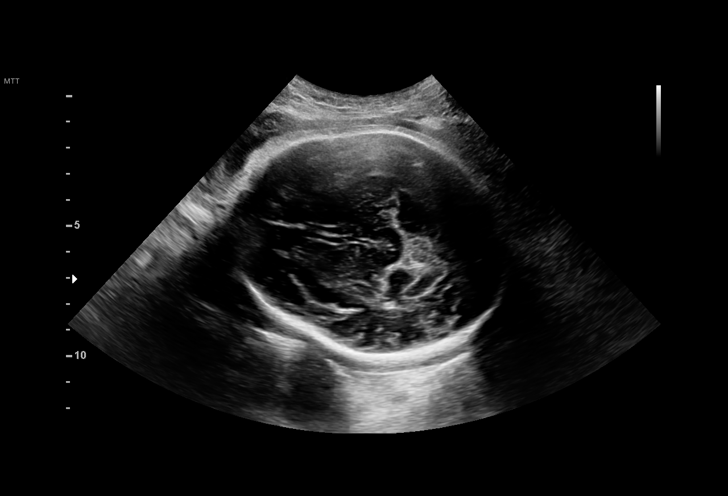
[im 13/63]
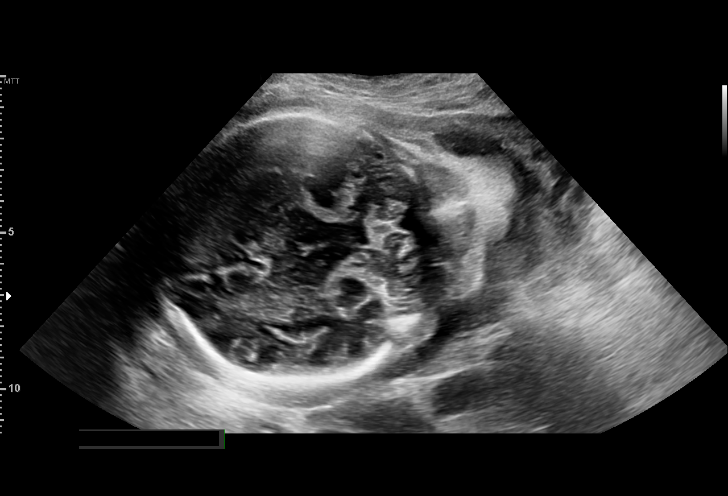
[im 18/63]
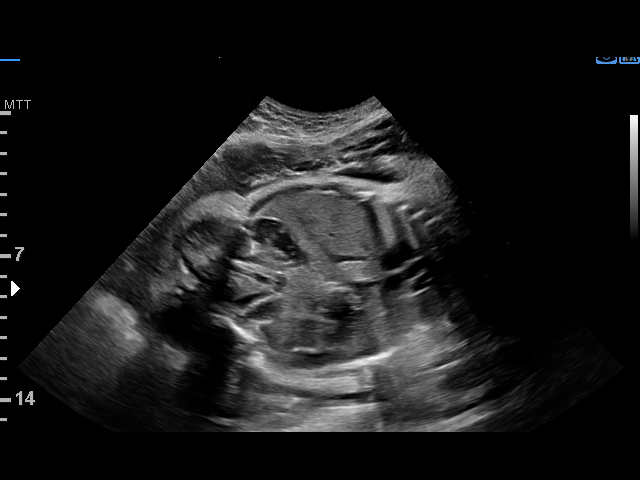
[im 23/63]
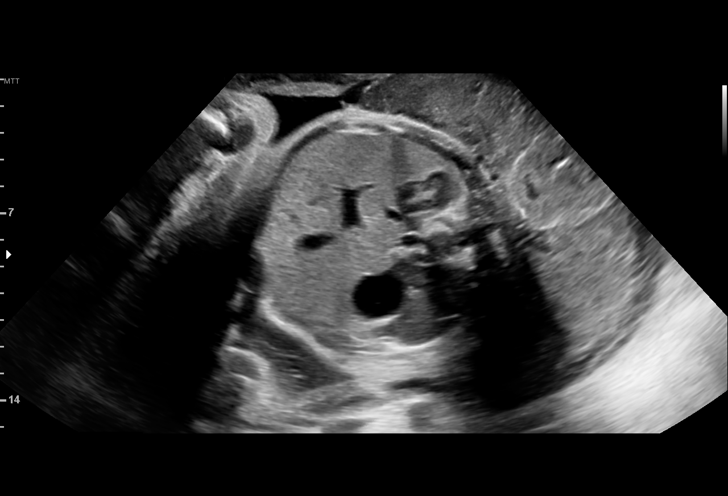
[im 28/63]
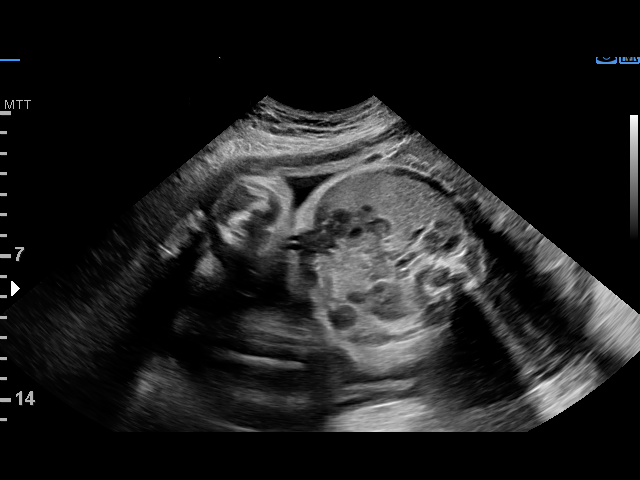
[im 35/63]
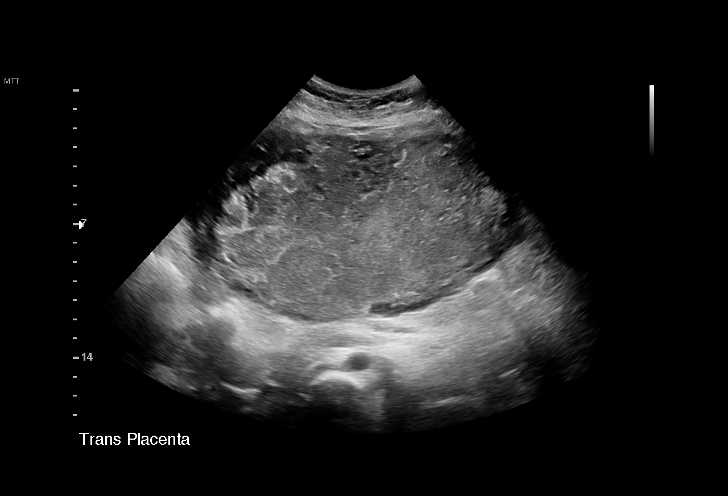
[im 40/63]
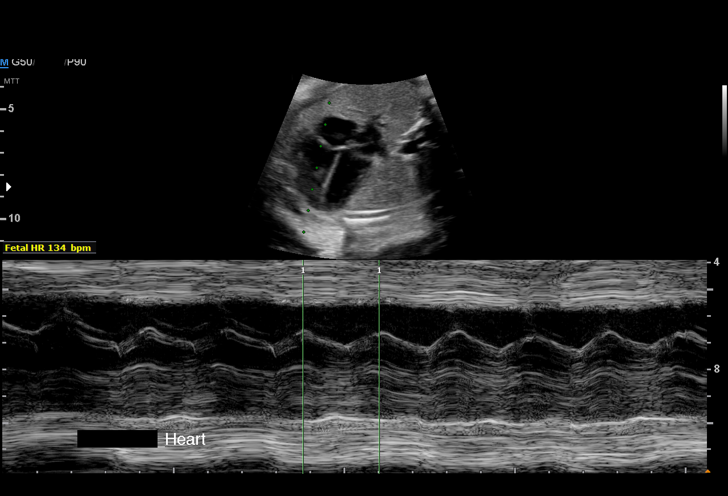
[im 45/63]
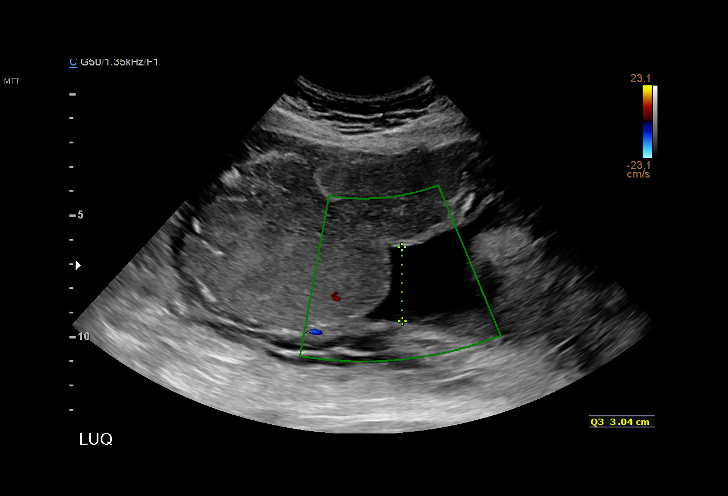
[im 50/63]
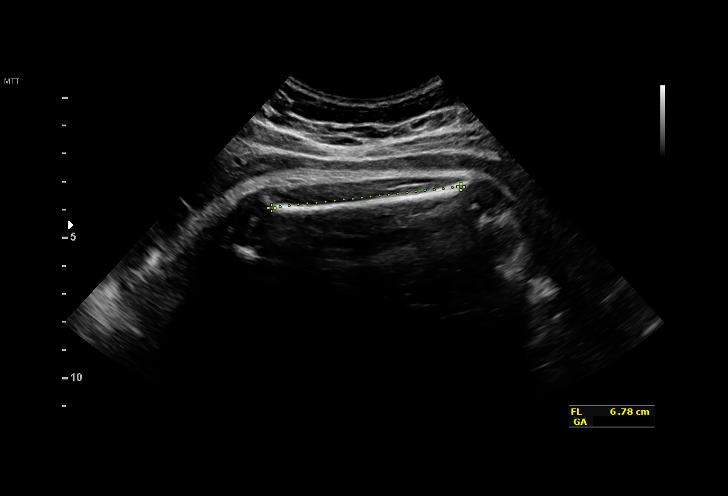
[im 55/63]
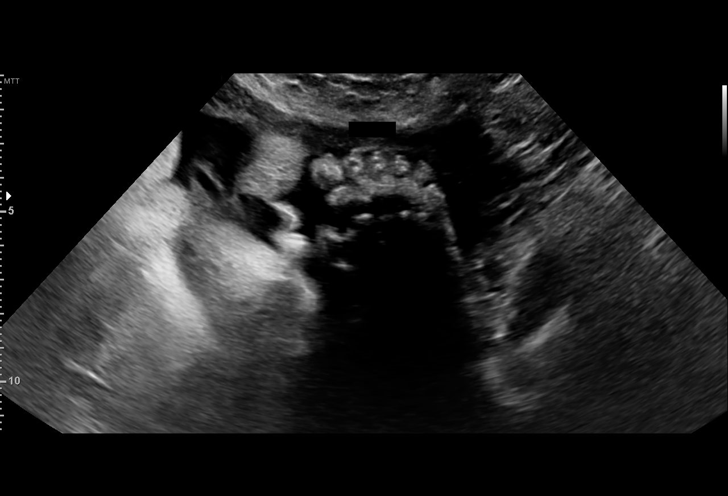
[im 60/63]
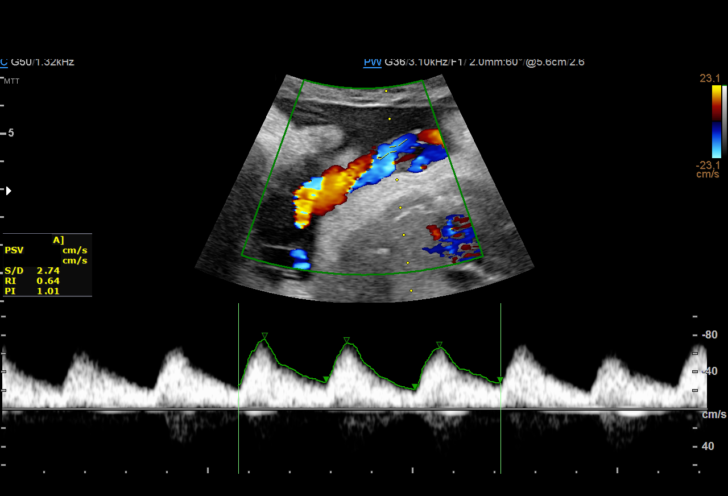

[Series 3: us mfm fetal bpp w/o non-stress · 1 of 6 slices shown (2 of 2)]
[im 1/6]
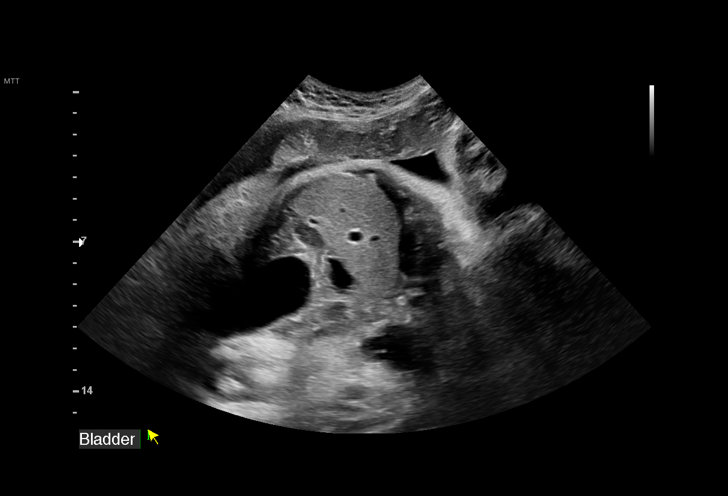

[13 of 28 positions shown; findings below may reference images not displayed]

OB/Gyn Clinic
LONDA MING
DO

1  GIORGI JUMPER            [id number]      [id number]     [id number]
2  GIORGI JUMPER            [id number]      [id number]     [id number]
3  GIORGI JUMPER            [id number]      [id number]     [id number]
Indications

37 weeks gestation of pregnancy
Poor obstetric history: Previous fetal growth
restriction (FGR)
Poor obstetric history: Previous midtrimester
loss (18 weeks)
Maternal care for known or suspected poor
fetal growth, third trimester, not applicable or
unspecified
Tobacco use complicating pregnancy, third
trimester
OB History

Blood Type:            Height:  5'5"   Weight (lb):  181       BMI:
Gravidity:    5         Term:   2        Prem:   0        SAB:   1
TOP:          1       Ectopic:  0        Living: 2
Fetal Evaluation

Num Of Fetuses:     1
Fetal Heart         134
Rate(bpm):
Cardiac Activity:   Observed
Presentation:       Cephalic
Placenta:           Fundal, above cervical os
P. Cord Insertion:  Previously Visualized

Amniotic Fluid
AFI FV:      Subjectively low-normal

AFI Sum(cm)     %Tile       Largest Pocket(cm)
10.54           28

RUQ(cm)       RLQ(cm)       LUQ(cm)        LLQ(cm)
1.35
Biophysical Evaluation

Amniotic F.V:   Within normal limits       F. Breathing:   Observed
F. Movement:    Observed                   Score:          [DATE]
Biometry

BPD:        86  mm     G. Age:  34w 5d          7  %    CI:         77.7   %    70 - 86
FL/HC:      21.6   %    20.8 -
HC:      308.8  mm     G. Age:  34w 3d        < 3  %    HC/AC:      0.97        0.92 -
AC:      318.1  mm     G. Age:  35w 5d         22  %    FL/BPD:     77.4   %    71 - 87
FL:       66.6  mm     G. Age:  34w 2d        < 3  %    FL/AC:      20.9   %    20 - 24
HUM:      57.2  mm     G. Age:  33w 1d        < 5  %
CER:      47.9  mm     G. Age:  N/A            92  %

Est. FW:    3496  gm    5 lb 11 oz      23  %
Gestational Age

LMP:           37w 2d        Date:  05/20/16                 EDD:   02/24/17
U/S Today:     34w 6d                                        EDD:   03/13/17
Best:          37w 2d     Det. By:  LMP  (05/20/16)          EDD:   02/24/17
Anatomy

Cranium:               Appears normal         Aortic Arch:            Previously seen
Cavum:                 Previously seen        Ductal Arch:            Previously seen
Ventricles:            Appears normal         Diaphragm:              Appears normal
Choroid Plexus:        Previously seen        Stomach:                Appears normal, left
sided
Cerebellum:            Previously seen        Abdomen:                Previously seen
Posterior Fossa:       Previously seen        Abdominal Wall:         Previously seen
Nuchal Fold:           Not applicable (>20    Cord Vessels:           Previously seen
wks GA)
Face:                  Orbits and profile     Kidneys:                Appear normal
previously seen
Lips:                  Previously seen        Bladder:                Appears normal
Thoracic:              Previously seen        Spine:                  Previously seen
Heart:                 Previously seen        Upper Extremities:      Previously seen
RVOT:                  Previously seen        Lower Extremities:      Previously seen
LVOT:                  Previously seen
Other:  Male gender previously seen.  Heels and 5th digit previously
visualized. Technically difficult due to fetal position.
Doppler - Fetal Vessels

Umbilical Artery
S/D     %tile     RI              PI              PSV    ADFV    RDFV
(cm/s)
2.53       60   0.61             0.84             93.57      No      No

Cervix Uterus Adnexa

Cervix
Not visualized (advanced GA >95wks)

Uterus
No abnormality visualized.

Left Ovary
Size(cm)     2.72   x   1.62   x  1.6       Vol(ml):
Within normal limits. No adnexal mass visualized.

Right Ovary
Size(cm)     3.04   x   1.55   x  1.61      Vol(ml): 4
Within normal limits. No adnexal mass visualized.

Cul De Sac:   No free fluid seen.

Adnexa:       No abnormality visualized.
Impression

SIUP at 37+2 weeks with SGA (low AC), here for growth
evaluation, BPP and dopplers
Normal interval anatomy
Normal amniotic fluid volume
EFW at the 23rd percentile; AC in now at the 22nd percentile
UA dopplers were normal for this GA
BPP [DATE]
Recommendations

Follow up in 1 week for evaluation of fetal wellbeing.
No need for intervention prior to 39 weeks

## 2018-11-13 ENCOUNTER — Encounter: Payer: Self-pay | Admitting: *Deleted

## 2019-02-08 ENCOUNTER — Ambulatory Visit (HOSPITAL_COMMUNITY)
Admission: EM | Admit: 2019-02-08 | Discharge: 2019-02-08 | Disposition: A | Discharge status: Home / Self Care | Payer: HRSA Program | Attending: Family Medicine | Admitting: Family Medicine

## 2019-02-08 ENCOUNTER — Encounter (HOSPITAL_COMMUNITY): Payer: Self-pay | Admitting: Physician Assistant

## 2019-02-08 DIAGNOSIS — R6883 Chills (without fever): Secondary | ICD-10-CM

## 2019-02-08 DIAGNOSIS — F17210 Nicotine dependence, cigarettes, uncomplicated: Secondary | ICD-10-CM | POA: Insufficient documentation

## 2019-02-08 DIAGNOSIS — R52 Pain, unspecified: Secondary | ICD-10-CM

## 2019-02-08 DIAGNOSIS — Z20828 Contact with and (suspected) exposure to other viral communicable diseases: Secondary | ICD-10-CM | POA: Diagnosis not present

## 2019-02-08 DIAGNOSIS — R509 Fever, unspecified: Secondary | ICD-10-CM | POA: Diagnosis not present

## 2019-02-08 DIAGNOSIS — Z20822 Contact with and (suspected) exposure to covid-19: Secondary | ICD-10-CM

## 2019-02-08 MED ORDER — DICLOFENAC SODIUM 1 % TD GEL: 2.0000 g | Freq: Four times a day (QID) | TRANSDERMAL | 0 refills | Status: DC

## 2019-02-08 NOTE — ED Provider Notes (Signed)
[facility]    CSN: [id number] Arrival date & time: 02/08/19  1343     History   Chief Complaint Chief Complaint  Patient presents with  . Generalized Body Aches  . Fever    HPI Pamela Curtis is a 23 y.o. female.   23 year old female comes in for 2-day history of body aches, chills, subjective fever.  Denies cough, congestion, sore throat.  Denies abdominal pain, nausea, vomiting, diarrhea.  Denies loss of taste or smell.  Denies shortness of breath.  Not currently working.  No obvious sick contact.  No travels.  Lives with her children, who also stays with their dad part-time.  Patient also complains of heel pain when walking.  Denies swelling, erythema, warmth.  Denies injury/trauma.     Past Medical History:  Diagnosis Date  . Cervical incompetence with baby delivered in second trimester 12/04/2012   Needs cerclage and close follow up with subsequent pregnancies   . Headache(784.0)    MIGRAINES NO MEDS    Patient Active Problem List   Diagnosis Date Noted  . Encounter for induction of labor 02/17/2017  . Poor fetal growth affecting management of mother in third trimester 02/17/2017  . History of poor fetal growth 02/13/2017  . Previous preterm delivery in second trimester, antepartum 11/07/2016  . Supervision of high-risk pregnancy 09/27/2016  . Prior pregnancy complicated by IUGR, antepartum 10/29/2013  . Incompetent cervix in pregnancy, antepartum 07/30/2013  . 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 CERVICAL CERCLAGE N/A 08/03/2013   Procedure: CERCLAGE CERVICAL;  Surgeon: Donnamae Jude, MD;  Location: [facility];  Service: Gynecology;  Laterality: N/A;  . DILATION AND EVACUATION N/A 12/04/2012   Procedure: DILATATION AND EVACUATION;  Surgeon: Osborne Oman, MD;  Location: [facility];  Service: Gynecology;  Laterality: N/A;  . NO PAST SURGERIES      OB History    Gravida  5   Para  3   Term  3    Preterm      AB  2   Living  3     SAB  1   TAB  1   Ectopic      Multiple  0   Live Births  3            Home Medications    Prior to Admission medications   Medication Sig Start Date End Date Taking? Authorizing Provider  acetaminophen (TYLENOL) 500 MG tablet Take 1,000 mg by mouth daily as needed for mild pain, moderate pain, fever or headache.     [provider]  diclofenac sodium (VOLTAREN) 1 % GEL Apply 2 g topically 4 (four) times daily. 02/08/19   Ok Edwards, PA-C    Family History History reviewed. No pertinent family history.  Social History Social History   Tobacco Use  . Smoking status: Current Every Day Smoker    Packs/day: 0.25    Years: 6.00    Pack years: 1.50    Types: Cigarettes  . Smokeless tobacco: Never Used  Substance Use Topics  . Alcohol use: No  . Drug use: No     Allergies   Patient has no known allergies.   Review of Systems Review of Systems  Reason unable to perform ROS: See HPI as above.     Physical Exam Triage Vital Signs ED Triage Vitals  Enc Vitals Group     BP 02/08/19 1437 127/80  Pulse Rate 02/08/19 1437 (!) 120     Resp 02/08/19 1437 16     Temp 02/08/19 1437 99.7 F (37.6 C)     Temp Source 02/08/19 1437 Temporal     SpO2 02/08/19 1437 100 %     Weight --      Height --      Head Circumference --      Peak Flow --      Pain Score 02/08/19 1438 4     Pain Loc --      Pain Edu? --      Excl. in GC? --    No data found.  Updated Vital Signs BP 127/80 (BP Location: Right Arm)   Pulse (!) 120   Temp 99.7 F (37.6 C) (Temporal)   Resp 16   SpO2 100%   Physical Exam Constitutional:      General: She is not in acute distress.    Appearance: Normal appearance. She is not ill-appearing, toxic-appearing or diaphoretic.  HENT:     Head: Normocephalic and atraumatic.     Mouth/Throat:     Mouth: Mucous membranes are moist.     Pharynx: Oropharynx is clear. Uvula midline.  Neck:      Musculoskeletal: Normal range of motion and neck supple.  Cardiovascular:     Rate and Rhythm: Regular rhythm. Tachycardia present.     Heart sounds: Normal heart sounds. No murmur. No friction rub. No gallop.   Pulmonary:     Effort: Pulmonary effort is normal. No accessory muscle usage, prolonged expiration, respiratory distress or retractions.     Comments: Lungs clear to auscultation without adventitious lung sounds. Musculoskeletal:     Comments: No swelling, erythema, warmth, contusion seen.  Tenderness to palpation of posterior heel/Achilles tendon.  Full range of motion of ankle and toes.  Strength normal and equal bilaterally.  Sensation intact and equal bilaterally.  Pedal pulse 2+.  Neurological:     General: No focal deficit present.     Mental Status: She is alert and oriented to person, place, and time.     UC Treatments / Results  Labs (all labs ordered are listed, but only abnormal results are displayed) Labs Reviewed  NOVEL CORONAVIRUS, NAA (HOSPITAL ORDER, SEND-OUT TO REF LAB)    EKG   Radiology No results found.  Procedures Procedures (including critical care time)  Medications Ordered in UC Medications - No data to display  Initial Impression / Assessment and Plan / UC Course  I have reviewed the triage vital signs and the nursing notes.  Pertinent labs & imaging results that were available during my care of the patient were reviewed by me and considered in my medical decision making (see chart for details).    Patient speaking in full sentences without respiratory distress. COVID testing ordered. Given history and exam, will have patient self quarantine until results. Instructions on when to end quarantine, family member isolation discussed, and resources provided. Symptomatic treatment discussed. Return precautions given. Patient expresses understanding and agrees to plan.  Final Clinical Impressions(s) / UC Diagnoses   Final diagnoses:  Suspected  Covid-19 Virus Infection  Chills    ED Prescriptions    Medication Sig Dispense Auth. Provider   diclofenac sodium (VOLTAREN) 1 % GEL Apply 2 g topically 4 (four) times daily. 100 g Threasa AlphaYu, Carden Teel V, PA-C        Colter Magowan V, [place] JerseyPA-C 02/08/19 1627

## 2019-02-08 NOTE — Discharge Instructions (Signed)
As discussed, cannot rule out COVID. Currently, no alarming signs. Testing ordered. Take tylenol for body aches/fever. I would like you to quarantine until testing results. If experiencing shortness of breath, trouble breathing, call 911 and provide them with your current situation.

## 2019-02-08 NOTE — ED Triage Notes (Signed)
Per pt she woke up Saturday morning with generalized body aches, cold chills, fevers.

## 2019-02-10 LAB — NOVEL CORONAVIRUS, NAA (HOSP ORDER, SEND-OUT TO REF LAB; TAT 18-24 HRS): SARS-CoV-2, NAA: NOT DETECTED

## 2021-02-19 ENCOUNTER — Ambulatory Visit (HOSPITAL_COMMUNITY)
Admission: EM | Admit: 2021-02-19 | Discharge: 2021-02-19 | Disposition: A | Discharge status: Home / Self Care | Payer: Self-pay | Attending: Emergency Medicine | Admitting: Emergency Medicine

## 2021-02-19 ENCOUNTER — Other Ambulatory Visit: Payer: Self-pay

## 2021-02-19 ENCOUNTER — Encounter (HOSPITAL_COMMUNITY): Payer: Self-pay | Admitting: *Deleted

## 2021-02-19 DIAGNOSIS — R110 Nausea: Secondary | ICD-10-CM

## 2021-02-19 DIAGNOSIS — K219 Gastro-esophageal reflux disease without esophagitis: Secondary | ICD-10-CM

## 2021-02-19 LAB — POCT URINALYSIS DIPSTICK, ED / UC
Bilirubin Urine: NEGATIVE
Glucose, UA: NEGATIVE mg/dL
Hgb urine dipstick: NEGATIVE
Ketones, ur: NEGATIVE mg/dL
Leukocytes,Ua: NEGATIVE
Nitrite: NEGATIVE
Protein, ur: NEGATIVE mg/dL
Specific Gravity, Urine: 1.025 (ref 1.005–1.030)
Urobilinogen, UA: 0.2 mg/dL (ref 0.0–1.0)
pH: 6 (ref 5.0–8.0)

## 2021-02-19 LAB — POC URINE PREG, ED: Preg Test, Ur: NEGATIVE

## 2021-02-19 MED ORDER — PANTOPRAZOLE SODIUM 40 MG PO TBEC: 40.0000 mg | DELAYED_RELEASE_TABLET | Freq: Every day | ORAL | 0 refills | Status: DC

## 2021-02-19 NOTE — ED Triage Notes (Signed)
Pt reports Nausea for one month. Pt is worried  about cancer.

## 2021-02-19 NOTE — ED Provider Notes (Signed)
[facility]    CSN: [id number] Arrival date & time: 02/19/21  1746      History   Chief Complaint Chief Complaint  Patient presents with   Nausea    HPI Pamela Curtis is a 25 y.o. female.   Patient here for evaluation of intermittent nausea that has been ongoing for the past month.  Reports nausea will come on sporadically and resolve on its own.  Denies any abdominal pain, vomiting, diarrhea, or constipation.  Denies any trauma, injury, or other precipitating event.  Denies any specific alleviating or aggravating factors.  Denies any fevers, chest pain, shortness of breath, numbness, tingling, weakness, or headaches.    The history is provided by the patient.   Past Medical History:  Diagnosis Date   Cervical incompetence with baby delivered in second trimester 12/04/2012   Needs cerclage and close follow up with subsequent pregnancies    Headache(784.0)    MIGRAINES NO MEDS    Patient Active Problem List   Diagnosis Date Noted   Encounter for induction of labor 02/17/2017   Poor fetal growth affecting management of mother in third trimester 02/17/2017   History of poor fetal growth 02/13/2017   Previous preterm delivery in second trimester, antepartum 11/07/2016   Supervision of high-risk pregnancy 09/27/2016   Prior pregnancy complicated by IUGR, antepartum 10/29/2013   Incompetent cervix in pregnancy, antepartum 07/30/2013   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    Past Surgical History:  Procedure Laterality Date   CERVICAL CERCLAGE N/A 08/03/2013   Procedure: CERCLAGE CERVICAL;  Surgeon: Reva Bores, MD;  Location: [facility];  Service: Gynecology;  Laterality: N/A;   DILATION AND EVACUATION N/A 12/04/2012   Procedure: DILATATION AND EVACUATION;  Surgeon: Tereso Newcomer, MD;  Location: [facility];  Service: Gynecology;  Laterality: N/A;   NO PAST SURGERIES      OB History     Gravida  5   Para  3   Term  3   Preterm      AB  2   Living  3       SAB  1   IAB  1   Ectopic      Multiple  0   Live Births  3            Home Medications    Prior to Admission medications   Medication Sig Start Date End Date Taking? Authorizing Provider  pantoprazole (PROTONIX) 40 MG tablet Take 1 tablet (40 mg total) by mouth daily. 02/19/21  Yes Ivette Loyal, NP  acetaminophen (TYLENOL) 500 MG tablet Take 1,000 mg by mouth daily as needed for mild pain, moderate pain, fever or headache.     [provider]  diclofenac sodium (VOLTAREN) 1 % GEL Apply 2 g topically 4 (four) times daily. 02/08/19   Belinda Fisher, PA-C    Family History History reviewed. No pertinent family history.  Social History Social History   Tobacco Use   Smoking status: Every Day    Packs/day: 0.25    Years: 6.00    Pack years: 1.50    Types: Cigarettes   Smokeless tobacco: Never  Substance Use Topics   Alcohol use: No   Drug use: No     Allergies   Patient has no known allergies.   Review of Systems Review of Systems  Gastrointestinal:  Positive for nausea. Negative for abdominal pain, constipation, diarrhea and vomiting.  All other systems reviewed and are  negative.   Physical Exam Triage Vital Signs ED Triage Vitals  Enc Vitals Group     BP 02/19/21 1819 (!) 103/54     Pulse Rate 02/19/21 1819 99     Resp 02/19/21 1819 20     Temp 02/19/21 1819 98.7 F (37.1 C)     Temp src --      SpO2 02/19/21 1819 99 %     Weight --      Height --      Head Circumference --      Peak Flow --      Pain Score 02/19/21 1820 0     Pain Loc --      Pain Edu? --      Excl. in GC? --    No data found.  Updated Vital Signs BP (!) 103/54   Pulse 99   Temp 98.7 F (37.1 C)   Resp 20   LMP 02/08/2021   SpO2 99%   Visual Acuity Right Eye Distance:   Left Eye Distance:   Bilateral Distance:    Right Eye Near:   Left Eye Near:    Bilateral Near:     Physical Exam Vitals and nursing note reviewed.  Constitutional:      General:  She is not in acute distress.    Appearance: Normal appearance. She is not ill-appearing, toxic-appearing or diaphoretic.  HENT:     Head: Normocephalic and atraumatic.     Nose: Nose normal.  Eyes:     Conjunctiva/sclera: Conjunctivae normal.  Cardiovascular:     Rate and Rhythm: Normal rate.     Pulses: Normal pulses.     Heart sounds: Normal heart sounds.  Pulmonary:     Effort: Pulmonary effort is normal.     Breath sounds: Normal breath sounds.  Abdominal:     General: Abdomen is flat. Bowel sounds are normal. There is no distension.     Palpations: Abdomen is soft. There is no mass.     Tenderness: There is no abdominal tenderness. There is no right CVA tenderness, left CVA tenderness, guarding or rebound.     Hernia: No hernia is present.  Musculoskeletal:        General: Normal range of motion.     Cervical back: Normal range of motion.  Skin:    General: Skin is warm and dry.  Neurological:     General: No focal deficit present.     Mental Status: She is alert and oriented to person, place, and time.  Psychiatric:        Mood and Affect: Mood normal.     UC Treatments / Results  Labs (all labs ordered are listed, but only abnormal results are displayed) Labs Reviewed  POCT URINALYSIS DIPSTICK, ED / UC  POC URINE PREG, ED    EKG   Radiology No results found.  Procedures Procedures (including critical care time)  Medications Ordered in UC Medications - No data to display  Initial Impression / Assessment and Plan / UC Course  I have reviewed the triage vital signs and the nursing notes.  Pertinent labs & imaging results that were available during my care of the patient were reviewed by me and considered in my medical decision making (see chart for details).    Assessment negative for red flags or concerns.  Urinalysis negative and urine pregnancy negative.  Nausea possible GERD.  Will treat with protonix daily.  Discussed food choices for GERD and  encourage  fluids.  Do not lay flat immediately after eating.  Discussed importance of getting established with primary care provider for further evaluation and long term management of symptoms. PCP assistance started.   Final Clinical Impressions(s) / UC Diagnoses   Final diagnoses:  Nausea  Gastroesophageal reflux disease without esophagitis     Discharge Instructions      Take the protonix daily to help with acid reflux.    Try to avoid spicy or high acidity foods, especially late in the day.  Do not lay flat immediately after eating.   Make sure you are drinking plenty of fluids.    It is important to get established and follow up with a primary care provider for long term management of health problems.       ED Prescriptions     Medication Sig Dispense Auth. Provider   pantoprazole (PROTONIX) 40 MG tablet Take 1 tablet (40 mg total) by mouth daily. 30 tablet Ivette Loyal, NP      PDMP not reviewed this encounter.   Ivette Loyal, NP 02/19/21 1900

## 2021-02-19 NOTE — Discharge Instructions (Addendum)
Take the protonix daily to help with acid reflux.    Try to avoid spicy or high acidity foods, especially late in the day.  Do not lay flat immediately after eating.   Make sure you are drinking plenty of fluids.    It is important to get established and follow up with a primary care provider for long term management of health problems.

## 2021-08-01 ENCOUNTER — Encounter (HOSPITAL_COMMUNITY): Payer: Self-pay

## 2021-08-01 ENCOUNTER — Other Ambulatory Visit: Payer: Self-pay

## 2021-08-01 ENCOUNTER — Ambulatory Visit (HOSPITAL_COMMUNITY)
Admission: EM | Admit: 2021-08-01 | Discharge: 2021-08-01 | Disposition: A | Discharge status: Home / Self Care | Payer: Self-pay | Attending: Family Medicine | Admitting: Family Medicine

## 2021-08-01 DIAGNOSIS — L239 Allergic contact dermatitis, unspecified cause: Secondary | ICD-10-CM

## 2021-08-01 MED ORDER — TRIAMCINOLONE ACETONIDE 0.025 % EX OINT: 1.0000 "application " | TOPICAL_OINTMENT | Freq: Two times a day (BID) | CUTANEOUS | 0 refills | Status: DC | PRN

## 2021-08-01 MED ORDER — PREDNISONE 20 MG PO TABS: 20.0000 mg | ORAL_TABLET | Freq: Every day | ORAL | 0 refills | Status: AC

## 2021-08-01 MED ORDER — DEXAMETHASONE SODIUM PHOSPHATE 10 MG/ML IJ SOLN
INTRAMUSCULAR | Status: AC
  Filled 2021-08-01: qty 1

## 2021-08-01 MED ORDER — DEXAMETHASONE SODIUM PHOSPHATE 10 MG/ML IJ SOLN
10.0000 mg | Freq: Once | INTRAMUSCULAR | Status: AC
  Administered 2021-08-01: 10 mg via INTRAMUSCULAR

## 2021-08-01 NOTE — ED Provider Notes (Signed)
[place]    CSN: [id number] Arrival date & time: 08/01/21  1429      History   Chief Complaint Chief Complaint  Patient presents with   Rash    HPI Pamela Curtis is a 26 y.o. female.    Patient presents today with itchy rash involving her neck , chest, and back x one night ago. Three days ago she started taking weight loss medication otc. No other changes to detergents , foods, or cosmetics . The weight loss pills contains vinegar and magnesium. No known prior reactions to either. The rash pruritic and has developed into whelps in around her neck and lower torso. No difficulty swallowing, nausea or vomiting. Past Medical History:  Diagnosis Date   Cervical incompetence with baby delivered in second trimester 12/04/2012   Needs cerclage and close follow up with subsequent pregnancies    Headache(784.0)    MIGRAINES NO MEDS    Patient Active Problem List   Diagnosis Date Noted   Encounter for induction of labor 02/17/2017   Poor fetal growth affecting management of mother in third trimester 02/17/2017   History of poor fetal growth 02/13/2017   Previous preterm delivery in second trimester, antepartum 11/07/2016   Supervision of high-risk pregnancy 09/27/2016   Prior pregnancy complicated by IUGR, antepartum 10/29/2013   Incompetent cervix in pregnancy, antepartum 07/30/2013   Hemoglobin C trait ([place]) 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    Past Surgical History:  Procedure Laterality Date   CERVICAL CERCLAGE N/A 08/03/2013   Procedure: CERCLAGE CERVICAL;  Surgeon: Donnamae Jude, MD;  Location: [facility];  Service: Gynecology;  Laterality: N/A;   DILATION AND EVACUATION N/A 12/04/2012   Procedure: DILATATION AND EVACUATION;  Surgeon: Osborne Oman, MD;  Location: [facility];  Service: Gynecology;  Laterality: N/A;   NO PAST SURGERIES      OB History     Gravida  5   Para  3   Term  3   Preterm      AB  2   Living  3      SAB  1   IAB  1   Ectopic      Multiple  0    Live Births  3            Home Medications    Prior to Admission medications   Medication Sig Start Date End Date Taking? Authorizing Provider  predniSONE (DELTASONE) 20 MG tablet Take 1 tablet (20 mg total) by mouth daily with breakfast for 4 days. 08/01/21 08/05/21 Yes Scot Jun, FNP  triamcinolone (KENALOG) 0.025 % ointment Apply 1 application topically 2 (two) times daily as needed. 08/01/21  Yes Scot Jun, FNP  acetaminophen (TYLENOL) 500 MG tablet Take 1,000 mg by mouth daily as needed for mild pain, moderate pain, fever or headache.  Patient not taking: Reported on 08/01/2021    [provider]  diclofenac sodium (VOLTAREN) 1 % GEL Apply 2 g topically 4 (four) times daily. Patient not taking: Reported on 08/01/2021 02/08/19   Ok Edwards, PA-C  pantoprazole (PROTONIX) 40 MG tablet Take 1 tablet (40 mg total) by mouth daily. Patient not taking: Reported on 08/01/2021 02/19/21   Pearson Forster, NP    Family History History reviewed. No pertinent family history.  Social History Social History   Tobacco Use   Smoking status: Every Day    Packs/day: 0.25    Years: 6.00    Pack years: 1.50  Types: Cigarettes   Smokeless tobacco: Never  Substance Use Topics   Alcohol use: No   Drug use: No     Allergies   Patient has no known allergies.   Review of Systems Review of Systems Pertinent negatives listed in HPI   Physical Exam Triage Vital Signs ED Triage Vitals  Enc Vitals Group     BP 08/01/21 1525 111/74     Pulse Rate 08/01/21 1525 82     Resp 08/01/21 1525 14     Temp 08/01/21 1525 98.4 F (36.9 C)     Temp Source 08/01/21 1525 Oral     SpO2 08/01/21 1525 98 %     Weight --      Height --      Head Circumference --      Peak Flow --      Pain Score 08/01/21 1527 0     Pain Loc --      Pain Edu? --      Excl. in [place]? --    No data found.  Updated Vital Signs BP 111/74 (BP Location: Left Arm)    Pulse 82    Temp 98.4 F (36.9  C) (Oral)    Resp 14    SpO2 98%   Visual Acuity Right Eye Distance:   Left Eye Distance:   Bilateral Distance:    Right Eye Near:   Left Eye Near:    Bilateral Near:     Physical Exam Constitutional:      Appearance: Normal appearance.  HENT:     Head: Normocephalic and atraumatic.  Eyes:     Extraocular Movements: Extraocular movements intact.     Pupils: Pupils are equal, round, and reactive to light.  Cardiovascular:     Rate and Rhythm: Normal rate and regular rhythm.  Pulmonary:     Effort: Pulmonary effort is normal.  Skin:    Capillary Refill: Capillary refill takes less than 2 seconds.     Findings: Rash present.     Comments: Macular-fine-papular rash present upper torso and bilateral UE  Neurological:     General: No focal deficit present.     Mental Status: She is alert and oriented to person, place, and time.  Psychiatric:        Mood and Affect: Mood normal.        Behavior: Behavior normal.        Thought Content: Thought content normal.        Judgment: Judgment normal.     UC Treatments / Results  Labs (all labs ordered are listed, but only abnormal results are displayed) Labs Reviewed - No data to display  EKG   Radiology No results found.  Procedures Procedures (including critical care time)  Medications Ordered in UC Medications  dexamethasone (DECADRON) injection 10 mg (10 mg Intramuscular Given 08/01/21 1605)    Initial Impression / Assessment and Plan / UC Course  I have reviewed the triage vital signs and the nursing notes.  Pertinent labs & imaging results that were available during my care of the patient were reviewed by me and considered in my medical decision making (see chart for details).    Atopic contact dermatitis, unknown etiology Treatment with Decadron 10 mg IM , continue prednisone orally tomorrow. Triamcinolone cream TID to affected area as needed    D/C weight loss pills. Strict return precautions if rash  worsens or doesn't improve  Final Clinical Impressions(s) / UC Diagnoses   Final  diagnoses:  Allergic contact dermatitis, unspecified trigger     Discharge Instructions       You received a steroid injection here in clinic. Start oral prednisone tomorrow. Start applications of steroid cream today and uses 3 times per day to rash or itching areas of skin.  Download GoodRX App-the steroid cream is 20.00 and prednisone is less than 10.00     ED Prescriptions     Medication Sig Dispense Auth. Provider   predniSONE (DELTASONE) 20 MG tablet Take 1 tablet (20 mg total) by mouth daily with breakfast for 4 days. 4 tablet Scot Jun, FNP   triamcinolone (KENALOG) 0.025 % ointment Apply 1 application topically 2 (two) times daily as needed. 454 g Scot Jun, FNP      PDMP not reviewed this encounter.   Scot Jun, FNP 08/01/21 [phone]

## 2021-08-01 NOTE — ED Triage Notes (Signed)
Pt presents with an itchy rash on her neck, chest and back that began last night. Pt states 3 days ago she began taking OTC weight loss medication

## 2021-08-01 NOTE — Discharge Instructions (Signed)
You received a steroid injection here in clinic. Start oral prednisone tomorrow. Start applications of steroid cream today and uses 3 times per day to rash or itching areas of skin.  Download GoodRX App-the steroid cream is 20.00 and prednisone is less than 10.00

## 2022-06-15 ENCOUNTER — Ambulatory Visit
Admission: RE | Admit: 2022-06-15 | Discharge: 2022-06-15 | Disposition: A | Discharge status: Home / Self Care | Payer: Self-pay | Source: Ambulatory Visit | Attending: Emergency Medicine | Admitting: Emergency Medicine

## 2022-06-15 VITALS — BP 110/74 | HR 77 | Temp 98.4°F | Resp 18

## 2022-06-15 DIAGNOSIS — B349 Viral infection, unspecified: Secondary | ICD-10-CM | POA: Insufficient documentation

## 2022-06-15 DIAGNOSIS — Z20818 Contact with and (suspected) exposure to other bacterial communicable diseases: Secondary | ICD-10-CM | POA: Insufficient documentation

## 2022-06-15 DIAGNOSIS — J029 Acute pharyngitis, unspecified: Secondary | ICD-10-CM | POA: Insufficient documentation

## 2022-06-15 DIAGNOSIS — Z1152 Encounter for screening for COVID-19: Secondary | ICD-10-CM | POA: Insufficient documentation

## 2022-06-15 LAB — RESP PANEL BY RT-PCR (FLU A&B, COVID) ARPGX2
Influenza A by PCR: NEGATIVE
Influenza B by PCR: NEGATIVE
SARS Coronavirus 2 by RT PCR: NEGATIVE

## 2022-06-15 MED ORDER — FLUTICASONE PROPIONATE 50 MCG/ACT NA SUSP: 1.0000 | Freq: Every day | NASAL | 2 refills | Status: DC

## 2022-06-15 MED ORDER — CETIRIZINE HCL 10 MG PO TABS: 10.0000 mg | ORAL_TABLET | Freq: Every day | ORAL | 1 refills | Status: DC

## 2022-06-15 NOTE — ED Provider Notes (Signed)
[facility]    CSN: [id number] Arrival date & time: 06/15/22  1435    HISTORY   Chief Complaint  Patient presents with   Sore Throat   HPI Pamela Curtis is a pleasant, 26 y.o. female who presents to urgent care today. Patient reports being exposed to streptococcal pharyngitis a week ago.  Patient states she has had a sore throat that since yesterday, denies fever, headache, nausea, vomiting, rash, difficulty swallowing, difficulty maintaining airway, difficulty managing secretions.  Patient has normal vital signs on arrival today and appears to be in no acute distress.  The history is provided by the patient.   Past Medical History:  Diagnosis Date   Cervical incompetence with baby delivered in second trimester 12/04/2012   Needs cerclage and close follow up with subsequent pregnancies    Headache(784.0)    MIGRAINES NO MEDS   Patient Active Problem List   Diagnosis Date Noted   Encounter for induction of labor 02/17/2017   Poor fetal growth affecting management of mother in third trimester 02/17/2017   History of poor fetal growth 02/13/2017   Previous preterm delivery in second trimester, antepartum 11/07/2016   Supervision of high-risk pregnancy 09/27/2016   Prior pregnancy complicated by IUGR, antepartum 10/29/2013   Incompetent cervix in pregnancy, antepartum 07/30/2013   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   Past Surgical History:  Procedure Laterality Date   CERVICAL CERCLAGE N/A 08/03/2013   Procedure: CERCLAGE CERVICAL;  Surgeon: Donnamae Jude, MD;  Location: [facility];  Service: Gynecology;  Laterality: N/A;   DILATION AND EVACUATION N/A 12/04/2012   Procedure: DILATATION AND EVACUATION;  Surgeon: Osborne Oman, MD;  Location: [facility];  Service: Gynecology;  Laterality: N/A;   NO PAST SURGERIES     OB History     Gravida  5   Para  3   Term  3   Preterm      AB  2   Living  3      SAB  1   IAB  1   Ectopic      Multiple  0   Live  Births  3          Home Medications    Prior to Admission medications   Medication Sig Start Date End Date Taking? Authorizing Provider  cetirizine (ZYRTEC ALLERGY) 10 MG tablet Take 1 tablet (10 mg total) by mouth at bedtime. 06/15/22 12/12/22 Yes Lynden Oxford Scales, PA-C  fluticasone (FLONASE) 50 MCG/ACT nasal spray Place 1 spray into both nostrils daily. Begin by using 2 sprays in each nare daily for 3 to 5 days, then decrease to 1 spray in each nare daily. 06/15/22  Yes Lynden Oxford Scales, PA-C    Family History History reviewed. No pertinent family history. Social History Social History   Tobacco Use   Smoking status: Every Day    Packs/day: 0.25    Years: 6.00    Total pack years: 1.50    Types: Cigarettes   Smokeless tobacco: Never  Vaping Use   Vaping Use: Never used  Substance Use Topics   Alcohol use: No   Drug use: No   Allergies   Patient has no known allergies.  Review of Systems Review of Systems Pertinent findings revealed after performing a 14 point review of systems has been noted in the history of present illness.  Physical Exam Triage Vital Signs ED Triage Vitals  Enc Vitals Group     BP 05/12/21 0827 Marland Kitchen)  147/82     Pulse Rate 05/12/21 0827 72     Resp 05/12/21 0827 18     Temp 05/12/21 0827 98.3 F (36.8 C)     Temp Source 05/12/21 0827 Oral     SpO2 05/12/21 0827 98 %     Weight --      Height --      Head Circumference --      Peak Flow --      Pain Score 05/12/21 0826 5     Pain Loc --      Pain Edu? --      Excl. in [place]? --   No data found.  Updated Vital Signs BP 110/74 (BP Location: Left Arm)   Pulse 77   Temp 98.4 F (36.9 C) (Oral)   Resp 18   LMP 06/15/2022 (Approximate)   SpO2 98%   Breastfeeding No   Physical Exam Vitals and nursing note reviewed.  Constitutional:      General: She is not in acute distress.    Appearance: Normal appearance. She is not ill-appearing.  HENT:     Head: Normocephalic and  atraumatic.     Salivary Glands: Right salivary gland is not diffusely enlarged or tender. Left salivary gland is not diffusely enlarged or tender.     Right Ear: Tympanic membrane, ear canal and external ear normal. No drainage. No middle ear effusion. There is no impacted cerumen. Tympanic membrane is not injected, erythematous or bulging.     Left Ear: Tympanic membrane, ear canal and external ear normal. No drainage.  No middle ear effusion. There is no impacted cerumen. Tympanic membrane is not injected, erythematous or bulging.     Ears:     Comments: Bilateral EACs normal, both TMs bulging with clear fluid    Nose: Rhinorrhea present. No nasal deformity, septal deviation, signs of injury, nasal tenderness, mucosal edema or congestion. Rhinorrhea is clear.     Right Nostril: Occlusion present. No foreign body, epistaxis or septal hematoma.     Left Nostril: Occlusion present. No foreign body, epistaxis or septal hematoma.     Right Turbinates: Enlarged, swollen and pale.     Left Turbinates: Enlarged, swollen and pale.     Right Sinus: No maxillary sinus tenderness or frontal sinus tenderness.     Left Sinus: No maxillary sinus tenderness or frontal sinus tenderness.     Mouth/Throat:     Lips: Pink. No lesions.     Mouth: Mucous membranes are moist. No oral lesions.     Pharynx: Oropharynx is clear. Uvula midline. No posterior oropharyngeal erythema or uvula swelling.     Tonsils: No tonsillar exudate. 0 on the right. 0 on the left.     Comments: Postnasal drip Eyes:     General: Lids are normal.        Right eye: No discharge.        Left eye: No discharge.     Extraocular Movements: Extraocular movements intact.     Conjunctiva/sclera: Conjunctivae normal.     Right eye: Right conjunctiva is not injected.     Left eye: Left conjunctiva is not injected.  Neck:     Trachea: Trachea and phonation normal.  Cardiovascular:     Rate and Rhythm: Normal rate and regular rhythm.      Pulses: Normal pulses.     Heart sounds: Normal heart sounds. No murmur heard.    No friction rub. No gallop.  Pulmonary:  Effort: Pulmonary effort is normal. No accessory muscle usage, prolonged expiration or respiratory distress.     Breath sounds: Normal breath sounds. No stridor, decreased air movement or transmitted upper airway sounds. No decreased breath sounds, wheezing, rhonchi or rales.  Chest:     Chest wall: No tenderness.  Musculoskeletal:        General: Normal range of motion.     Cervical back: Normal range of motion and neck supple. Normal range of motion.  Lymphadenopathy:     Cervical: No cervical adenopathy.  Skin:    General: Skin is warm and dry.     Findings: No erythema or rash.  Neurological:     General: No focal deficit present.     Mental Status: She is alert and oriented to person, place, and time.  Psychiatric:        Mood and Affect: Mood normal.        Behavior: Behavior normal.     Visual Acuity Right Eye Distance:   Left Eye Distance:   Bilateral Distance:    Right Eye Near:   Left Eye Near:    Bilateral Near:     UC Couse / Diagnostics / Procedures:     Radiology No results found.  Procedures Procedures (including critical care time) EKG  Pending results:  Labs Reviewed  CULTURE, GROUP A STREP ([place])  RESP PANEL BY RT-PCR (FLU A&B, COVID) [id number]  POCT RAPID STREP A (OFFICE)    Medications Ordered in UC: Medications - No data to display  UC Diagnoses / Final Clinical Impressions(s)   I have reviewed the triage vital signs and the nursing notes.  Pertinent labs & imaging results that were available during my care of the patient were reviewed by me and considered in my medical decision making (see chart for details).    Final diagnoses:  Exposure to Streptococcal pharyngitis  Acute pharyngitis, unspecified etiology  Viral infection   Rapid strep test today is negative, throat culture pending.  I believe that her sore  throat is more likely due to a viral etiology.  Patient provided with allergy medications based on physical exam findings concerning for uncontrolled respiratory allergies.  Please see discharge instructions below for further details of plan of care.  Return precautions advised.  ED Prescriptions     Medication Sig Dispense Auth. Provider   cetirizine (ZYRTEC ALLERGY) 10 MG tablet Take 1 tablet (10 mg total) by mouth at bedtime. 90 tablet Lynden Oxford Scales, PA-C   fluticasone (FLONASE) 50 MCG/ACT nasal spray Place 1 spray into both nostrils daily. Begin by using 2 sprays in each nare daily for 3 to 5 days, then decrease to 1 spray in each nare daily. 15.8 mL Lynden Oxford Scales, PA-C      PDMP not reviewed this encounter.  Disposition Upon Discharge:  Condition: stable for discharge home Home: take medications as prescribed; routine discharge instructions as discussed; follow up as advised.  Patient presented with an acute illness with associated systemic symptoms and significant discomfort requiring urgent management. In my opinion, this is a condition that a prudent lay person (someone who possesses an average knowledge of health and medicine) may potentially expect to result in complications if not addressed urgently such as respiratory distress, impairment of bodily function or dysfunction of bodily organs.   Routine symptom specific, illness specific and/or disease specific instructions were discussed with the patient and/or caregiver at length.   As such, the patient has been evaluated and assessed, work-up  was performed and treatment was provided in alignment with urgent care protocols and evidence based medicine.  Patient/parent/caregiver has been advised that the patient may require follow up for further testing and treatment if the symptoms continue in spite of treatment, as clinically indicated and appropriate.  If the patient was tested for COVID-19, Influenza and/or RSV,  then the patient/parent/guardian was advised to isolate at home pending the results of his/her diagnostic coronavirus test and potentially longer if they're positive. I have also advised pt that if his/her COVID-19 test returns positive, it's recommended to self-isolate for at least 10 days after symptoms first appeared AND until fever-free for 24 hours without fever reducer AND other symptoms have improved or resolved. Discussed self-isolation recommendations as well as instructions for household member/close contacts as per the [facility] and [place] DHHS, and also gave patient the [place] packet with this information.  Patient/parent/caregiver has been advised to return to the [facility] or PCP in 3-5 days if no better; to PCP or the Emergency Department if new signs and symptoms develop, or if the current signs or symptoms continue to change or worsen for further workup, evaluation and treatment as clinically indicated and appropriate  The patient will follow up with their current PCP if and as advised. If the patient does not currently have a PCP we will assist them in obtaining one.   The patient may need specialty follow up if the symptoms continue, in spite of conservative treatment and management, for further workup, evaluation, consultation and treatment as clinically indicated and appropriate.  Patient/parent/caregiver verbalized understanding and agreement of plan as discussed.  All questions were addressed during visit.  Please see discharge instructions below for further details of plan.  Discharge Instructions:   Discharge Instructions      Your strep test today is negative.  Streptococcal throat culture will be performed per our protocol.  The result of your throat culture will be posted to your MyChart once it is complete, this typically takes 3 to 5 days.  If your streptococcal throat culture is positive, you will be contacted by phone and antibiotics will prescribed for you.   You received a COVID-19  and influenza PCR test today.  The results of your PCR testing will be posted to your MyChart once it is complete.  This typically takes 6 to 12 hours.    If your influenza PCR test is positive, you will be contacted by phone.  Due to the current duration of your symptoms, you will no longer benefit from antiviral therapy for influenza.     If your COVID-19 PCR test is positive, you will be contacted by phone.  Because you do not have a history of being immune compromised, you are currently vaccinated for COVID-19, you are under the age of 55, you do not have a risk of severe disease due to COVID-19, antiviral treatment is not indicated.    If your COVID-19 PCR test is negative, please consider retesting in the next 2 to 3 days, particularly if you are not feeling any better.  You are welcome to return here to urgent care to have it done or you can take a home COVID-19 test.   If both your COVID-19 tests are negative and your influenza test is also negative, then you can safely assume that your illness is due to one of the many less serious illnesses circulating in our community right now.  Conservative care is recommended with rest, drinking plenty of clear fluids, eating only  when hungry, taking supportive medications for your symptoms and avoiding being around other people.  Please remain at home until you are fever free for 24 hours without the use of antifever medications such as Tylenol and ibuprofen.     Based on my physical exam findings and the history you have provided  today, I do not recommend antibiotics at this time.  I do not believe the risks and side effects of antibiotics would outweigh any minimal benefit that they might provide.         Please read below to learn more about the medications, dosages and frequencies that I recommend to help alleviate your symptoms and to get you feeling better soon:   Zyrtec (cetirizine): This is an excellent second-generation antihistamine that helps  to reduce respiratory inflammatory response to environmental allergens.  In some patients, this medication can cause daytime sleepiness so I recommend that you take 1 tablet daily at bedtime.     Flonase (fluticasone): This is a steroid nasal spray that you use once daily, 1 spray in each nare.  This medication does not work well if you decide to use it only used as you feel you need to, it works best used on a daily basis.  After 3 to 5 days of use, you will notice significant reduction of the inflammation and mucus production that is currently being caused by exposure to allergens, whether seasonal or environmental.  The most common side effect of this medication is nosebleeds.  If you experience a nosebleed, please discontinue use for 1 week, then feel free to resume.  I have provided you with a prescription.    Advil, Motrin (ibuprofen): This is a good anti-inflammatory medication which not only addresses aches, pains but also significantly reduces soft tissue inflammation of the upper airways that causes sinus and nasal congestion as well as inflammation of the lower airways which makes you feel like your breathing is constricted or your cough feel tight.  I recommend that you take 400 mg every 8 hours as needed.      If you find that you have not had improvement of your symptoms in the next 5 to 7 days, please follow-up with your primary care provider or return here to urgent care for repeat evaluation and further recommendations.   Thank you for visiting urgent care today.  We appreciate the opportunity to participate in your care.       This office note has been dictated using Museum/gallery curator.  Unfortunately, this method of dictation can sometimes lead to typographical or grammatical errors.  I apologize for your inconvenience in advance if this occurs.  Please do not hesitate to reach out to me if clarification is needed.      Lynden Oxford Scales, PA-C 06/17/22  1740

## 2022-06-15 NOTE — ED Triage Notes (Signed)
Patient presents to [facility] for sore throat since yesterday. States strep exposure. Not taking anything for discomfort.   Denies fever.

## 2022-06-15 NOTE — Discharge Instructions (Signed)
Your strep test today is negative.  Streptococcal throat culture will be performed per our protocol.  The result of your throat culture will be posted to your MyChart once it is complete, this typically takes 3 to 5 days.  If your streptococcal throat culture is positive, you will be contacted by phone and antibiotics will prescribed for you.   You received a COVID-19 and influenza PCR test today.  The results of your PCR testing will be posted to your MyChart once it is complete.  This typically takes 6 to 12 hours.    If your influenza PCR test is positive, you will be contacted by phone.  Due to the current duration of your symptoms, you will no longer benefit from antiviral therapy for influenza.     If your COVID-19 PCR test is positive, you will be contacted by phone.  Because you do not have a history of being immune compromised, you are currently vaccinated for COVID-19, you are under the age of 59, you do not have a risk of severe disease due to COVID-19, antiviral treatment is not indicated.    If your COVID-19 PCR test is negative, please consider retesting in the next 2 to 3 days, particularly if you are not feeling any better.  You are welcome to return here to urgent care to have it done or you can take a home COVID-19 test.   If both your COVID-19 tests are negative and your influenza test is also negative, then you can safely assume that your illness is due to one of the many less serious illnesses circulating in our community right now.  Conservative care is recommended with rest, drinking plenty of clear fluids, eating only when hungry, taking supportive medications for your symptoms and avoiding being around other people.  Please remain at home until you are fever free for 24 hours without the use of antifever medications such as Tylenol and ibuprofen.     Based on my physical exam findings and the history you have provided  today, I do not recommend antibiotics at this time.  I do not  believe the risks and side effects of antibiotics would outweigh any minimal benefit that they might provide.         Please read below to learn more about the medications, dosages and frequencies that I recommend to help alleviate your symptoms and to get you feeling better soon:   Zyrtec (cetirizine): This is an excellent second-generation antihistamine that helps to reduce respiratory inflammatory response to environmental allergens.  In some patients, this medication can cause daytime sleepiness so I recommend that you take 1 tablet daily at bedtime.     Flonase (fluticasone): This is a steroid nasal spray that you use once daily, 1 spray in each nare.  This medication does not work well if you decide to use it only used as you feel you need to, it works best used on a daily basis.  After 3 to 5 days of use, you will notice significant reduction of the inflammation and mucus production that is currently being caused by exposure to allergens, whether seasonal or environmental.  The most common side effect of this medication is nosebleeds.  If you experience a nosebleed, please discontinue use for 1 week, then feel free to resume.  I have provided you with a prescription.    Advil, Motrin (ibuprofen): This is a good anti-inflammatory medication which not only addresses aches, pains but also significantly reduces soft tissue inflammation  of the upper airways that causes sinus and nasal congestion as well as inflammation of the lower airways which makes you feel like your breathing is constricted or your cough feel tight.  I recommend that you take 400 mg every 8 hours as needed.      If you find that you have not had improvement of your symptoms in the next 5 to 7 days, please follow-up with your primary care provider or return here to urgent care for repeat evaluation and further recommendations.   Thank you for visiting urgent care today.  We appreciate the opportunity to participate in your  care.

## 2022-06-18 LAB — CULTURE, GROUP A STREP (THRC)

## 2022-09-24 ENCOUNTER — Ambulatory Visit
Admission: EM | Admit: 2022-09-24 | Discharge: 2022-09-24 | Disposition: A | Discharge status: Home / Self Care | Payer: Commercial Managed Care - HMO | Attending: Internal Medicine | Admitting: Internal Medicine

## 2022-09-24 DIAGNOSIS — L232 Allergic contact dermatitis due to cosmetics: Secondary | ICD-10-CM | POA: Diagnosis not present

## 2022-09-24 MED ORDER — PREDNISONE 20 MG PO TABS: 40.0000 mg | ORAL_TABLET | Freq: Every day | ORAL | 0 refills | Status: AC

## 2022-09-24 NOTE — Discharge Instructions (Signed)
Do not use the black African soap anymore.  Take prednisone 40 mg once a day for the next 5 days.  Do not take NSAID (ibuprofen/naproxen) when taking prednisone.   Take zyrtec '10mg'$  daily to help further suppress allergic reaction.  If you develop any new or worsening symptoms or do not improve in the next 2 to 3 days, please return.  If your symptoms are severe, please go to the emergency room.  Follow-up with your primary care provider for further evaluation and management of your symptoms as well as ongoing wellness visits.  I hope you feel better!

## 2022-09-24 NOTE — ED Provider Notes (Signed)
[facility]    CSN: [id number] Arrival date & time: 09/24/22  [id number]      History   Chief Complaint No chief complaint on file.   HPI Pamela Curtis is a 27 y.o. female.   Patient presents to urgent care for evaluation of diffuse rash to the face that started on Saturday, September 22, 2022 after using "black African" body wash to the face.  States that her skin was clear before using this new body wash to the face and she developed a "bumpy and itchy" rash to the face immediately after using it.  She has had significant itching to the face over the last 2 days, however states that itching has improved this morning.  She remembered that she had some triamcinolone cream leftover from a previous allergic reaction and use this to her face this morning.  She cannot tell if this is helped with her itching/rash.  Rash has not spread over the last 2 days but has not improved either.  No fever, chills, sore throat, feeling of throat closure, cough, shortness of breath, chest pain, or heart palpitations reported.  No ear pain or headache.  No nausea, vomiting, diarrhea, abdominal pain, or dizziness.  No vision changes.  She has not tried any over-the-counter medications other than the triamcinolone cream before coming to urgent care.      Past Medical History:  Diagnosis Date   Cervical incompetence with baby delivered in second trimester 12/04/2012   Needs cerclage and close follow up with subsequent pregnancies    Headache(784.0)    MIGRAINES NO MEDS    Patient Active Problem List   Diagnosis Date Noted   Encounter for induction of labor 02/17/2017   Poor fetal growth affecting management of mother in third trimester 02/17/2017   History of poor fetal growth 02/13/2017   Previous preterm delivery in second trimester, antepartum 11/07/2016   Supervision of high-risk pregnancy 09/27/2016   Prior pregnancy complicated by IUGR, antepartum 10/29/2013   Incompetent cervix in  pregnancy, antepartum 07/30/2013   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    Past Surgical History:  Procedure Laterality Date   CERVICAL CERCLAGE N/A 08/03/2013   Procedure: CERCLAGE CERVICAL;  Surgeon: Donnamae Jude, MD;  Location: [facility];  Service: Gynecology;  Laterality: N/A;   DILATION AND EVACUATION N/A 12/04/2012   Procedure: DILATATION AND EVACUATION;  Surgeon: Osborne Oman, MD;  Location: [facility];  Service: Gynecology;  Laterality: N/A;   NO PAST SURGERIES      OB History     Gravida  5   Para  3   Term  3   Preterm      AB  2   Living  3      SAB  1   IAB  1   Ectopic      Multiple  0   Live Births  3            Home Medications    Prior to Admission medications   Medication Sig Start Date End Date Taking? Authorizing Provider  predniSONE (DELTASONE) 20 MG tablet Take 2 tablets (40 mg total) by mouth daily for 5 days. 09/24/22 09/29/22 Yes Keante Urizar, Stasia Cavalier, FNP  cetirizine (ZYRTEC ALLERGY) 10 MG tablet Take 1 tablet (10 mg total) by mouth at bedtime. 06/15/22 12/12/22  Lynden Oxford Scales, PA-C  fluticasone (FLONASE) 50 MCG/ACT nasal spray Place 1 spray into both nostrils daily. Begin by using 2 sprays in  each nare daily for 3 to 5 days, then decrease to 1 spray in each nare daily. 06/15/22   Lynden Oxford Scales, PA-C    Family History History reviewed. No pertinent family history.  Social History Social History   Tobacco Use   Smoking status: Every Day    Packs/day: 0.25    Years: 6.00    Total pack years: 1.50    Types: Cigarettes   Smokeless tobacco: Never  Vaping Use   Vaping Use: Never used  Substance Use Topics   Alcohol use: No   Drug use: No     Allergies   Patient has no known allergies.   Review of Systems Review of Systems Per HPI  Physical Exam Triage Vital Signs ED Triage Vitals  Enc Vitals Group     BP 09/24/22 0930 131/82     Pulse Rate 09/24/22 0929 87     Resp 09/24/22 0929 18     Temp 09/24/22  0929 98.4 F (36.9 C)     Temp Source 09/24/22 0929 Oral     SpO2 09/24/22 0929 97 %     Weight --      Height --      Head Circumference --      Peak Flow --      Pain Score 09/24/22 0930 0     Pain Loc --      Pain Edu? --      Excl. in [place]? --    No data found.  Updated Vital Signs BP 131/82   Pulse 87   Temp 98.4 F (36.9 C) (Oral)   Resp 18   SpO2 97%   Visual Acuity Right Eye Distance:   Left Eye Distance:   Bilateral Distance:    Right Eye Near:   Left Eye Near:    Bilateral Near:     Physical Exam Vitals and nursing note reviewed.  Constitutional:      Appearance: Normal appearance. She is not ill-appearing or toxic-appearing.  HENT:     Head: Normocephalic and atraumatic.     Right Ear: Hearing and external ear normal.     Left Ear: Hearing and external ear normal.     Nose: Nose normal.     Mouth/Throat:     Lips: Pink.     Mouth: Mucous membranes are moist. No injury.     Tongue: No lesions. Tongue does not deviate from midline.     Palate: No mass and lesions.     Pharynx: Oropharynx is clear. Uvula midline. No pharyngeal swelling, oropharyngeal exudate, posterior oropharyngeal erythema or uvula swelling.     Tonsils: No tonsillar exudate or tonsillar abscesses.  Eyes:     General: Lids are normal. Vision grossly intact. Gaze aligned appropriately.        Right eye: No discharge.        Left eye: No discharge.     Extraocular Movements: Extraocular movements intact.     Conjunctiva/sclera: Conjunctivae normal.     Pupils: Pupils are equal, round, and reactive to light.  Cardiovascular:     Rate and Rhythm: Normal rate and regular rhythm.     Heart sounds: Normal heart sounds, S1 normal and S2 normal.  Pulmonary:     Effort: Pulmonary effort is normal. No respiratory distress.     Breath sounds: Normal breath sounds and air entry.  Musculoskeletal:     Cervical back: Neck supple.  Lymphadenopathy:     Cervical: No cervical adenopathy.  Skin:    General: Skin is warm and dry.     Capillary Refill: Capillary refill takes less than 2 seconds.     Findings: Rash present.     Comments: Diffuse maculopapular rash to the face as seen in image below.  No signs of excoriation.  Neurological:     General: No focal deficit present.     Mental Status: She is alert and oriented to person, place, and time. Mental status is at baseline.     Cranial Nerves: No dysarthria or facial asymmetry.  Psychiatric:        Mood and Affect: Mood normal.        Speech: Speech normal.        Behavior: Behavior normal.        Thought Content: Thought content normal.        Judgment: Judgment normal.         UC Treatments / Results  Labs (all labs ordered are listed, but only abnormal results are displayed) Labs Reviewed - No data to display  EKG   Radiology No results found.  Procedures Procedures (including critical care time)  Medications Ordered in UC Medications - No data to display  Initial Impression / Assessment and Plan / UC Course  I have reviewed the triage vital signs and the nursing notes.  Pertinent labs & imaging results that were available during my care of the patient were reviewed by me and considered in my medical decision making (see chart for details).  1.  Allergic contact dermatitis due to cosmetics Presentation is consistent with contact dermatitis due to recent use of new soap.  She is not experiencing any systemic/anaphylactic symptoms.  Low suspicion for bacterial/viral etiology.  Prednisone 40 mg once a day for 5 days sent to pharmacy to be taken with food each morning.  No NSAID when taking prednisone.  May take Zyrtec as well daily to further suppress allergic reaction.  Advised to stop using triamcinolone to the face.  May purchase Benadryl cream over-the-counter and use this to the face if needed.  Advised to follow-up in the next 2 to 3 days should symptoms fail to improve with interventions today.    Discussed physical exam and available lab work findings in clinic with patient.  Counseled patient regarding appropriate use of medications and potential side effects for all medications recommended or prescribed today. Discussed red flag signs and symptoms of worsening condition,when to call the PCP office, return to urgent care, and when to seek higher level of care in the emergency department. Patient verbalizes understanding and agreement with plan. All questions answered. Patient discharged in stable condition.   Final Clinical Impressions(s) / UC Diagnoses   Final diagnoses:  Allergic contact dermatitis due to cosmetics     Discharge Instructions      Do not use the black African soap anymore.  Take prednisone 40 mg once a day for the next 5 days.  Do not take NSAID (ibuprofen/naproxen) when taking prednisone.   Take zyrtec '10mg'$  daily to help further suppress allergic reaction.  If you develop any new or worsening symptoms or do not improve in the next 2 to 3 days, please return.  If your symptoms are severe, please go to the emergency room.  Follow-up with your primary care provider for further evaluation and management of your symptoms as well as ongoing wellness visits.  I hope you feel better!     ED Prescriptions     Medication Sig  Dispense Auth. Provider   predniSONE (DELTASONE) 20 MG tablet Take 2 tablets (40 mg total) by mouth daily for 5 days. 10 tablet Talbot Grumbling, FNP      PDMP not reviewed this encounter.   Talbot Grumbling, [place] 09/24/22 1003

## 2023-10-22 ENCOUNTER — Telehealth

## 2023-10-22 DIAGNOSIS — K219 Gastro-esophageal reflux disease without esophagitis: Secondary | ICD-10-CM

## 2023-10-22 DIAGNOSIS — J302 Other seasonal allergic rhinitis: Secondary | ICD-10-CM | POA: Diagnosis not present

## 2023-10-22 MED ORDER — LEVOCETIRIZINE DIHYDROCHLORIDE 5 MG PO TABS: 5.0000 mg | ORAL_TABLET | Freq: Every evening | ORAL | 0 refills | Status: AC

## 2023-10-22 MED ORDER — PANTOPRAZOLE SODIUM 40 MG PO TBEC: 40.0000 mg | DELAYED_RELEASE_TABLET | Freq: Every day | ORAL | 0 refills | Status: AC

## 2023-10-22 NOTE — Progress Notes (Signed)
 Virtual Visit Consent   Pamela Curtis, you are scheduled for a virtual visit with a [facility] provider today. Just as with appointments in the office, your consent must be obtained to participate. Your consent will be active for this visit and any virtual visit you may have with one of our providers in the next 365 days. If you have a MyChart account, a copy of this consent can be sent to you electronically.  As this is a virtual visit, video technology does not allow for your provider to perform a traditional examination. This may limit your provider's ability to fully assess your condition. If your provider identifies any concerns that need to be evaluated in person or the need to arrange testing (such as labs, EKG, etc.), we will make arrangements to do so. Although advances in technology are sophisticated, we cannot ensure that it will always work on either your end or our end. If the connection with a video visit is poor, the visit may have to be switched to a telephone visit. With either a video or telephone visit, we are not always able to ensure that we have a secure connection.  By engaging in this virtual visit, you consent to the provision of healthcare and authorize for your insurance to be billed (if applicable) for the services provided during this visit. Depending on your insurance coverage, you may receive a charge related to this service.  I need to obtain your verbal consent now. Are you willing to proceed with your visit today? Pamela Curtis has provided verbal consent on 10/22/2023 for a virtual visit (video or telephone). Pamela Curtis, [place]  Date: 10/22/2023 8:03 AM   Virtual Visit via Video Note   I, Pamela Curtis, connected with  Pamela Curtis  ([id number], [birth year]) on 10/22/23 at  7:45 AM EDT by a video-enabled telemedicine application and verified that I am speaking with the correct person using two identifiers.  Location: Patient: Virtual Visit Location  Patient: Home Provider: Virtual Visit Location Provider: Home Office   I discussed the limitations of evaluation and management by telemedicine and the availability of in person appointments. The patient expressed understanding and agreed to proceed.    History of Present Illness: Pamela Curtis is a 28 y.o. who identifies as a female who was assigned female at birth, and is being seen today for intermittent episodes of hoarseness over the past several weeks (around 1 month).  Notes episodes are sporadic.  Initially thought might be related to allergies so has been trying to hydrate well, using throat coat and cough drops without any change in symptoms.  Denies any substantial allergy symptoms, but some occasional postnasal drip.  Denies fevers, chills, sweats.  Notes that this past Friday she woke up with some tonsillar swelling without pain.  Could feel her tonsils every time she swallowed.  Notes swelling has improved.  Denies any throat pain at present.  Is noting some ongoing and progressing heartburn/reflux, which is worse at nighttime.  Sometimes waking her up in the middle of the night.  Does note that hoarseness seems more present during episodes of increased reflux.  Is currently between primary care providers.  HPI: HPI  Problems:  Patient Active Problem List   Diagnosis Date Noted   Encounter for induction of labor 02/17/2017   Poor fetal growth affecting management of mother in third trimester 02/17/2017   History of poor fetal growth 02/13/2017   Previous preterm delivery in second trimester,  antepartum 11/07/2016   Supervision of high-risk pregnancy 09/27/2016   Prior pregnancy complicated by IUGR, antepartum 10/29/2013   Incompetent cervix in pregnancy, antepartum 07/30/2013   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    Allergies: No Known Allergies Medications:  Current Outpatient Medications:    levocetirizine (XYZAL) 5 MG tablet, Take 1 tablet (5 mg total) by mouth every  evening., Disp: 30 tablet, Rfl: 0   pantoprazole (PROTONIX) 40 MG tablet, Take 1 tablet (40 mg total) by mouth daily., Disp: 30 tablet, Rfl: 0  Observations/Objective: Patient is well-developed, well-nourished in no acute distress.  Resting comfortably  at home.  Head is normocephalic, atraumatic.  No labored breathing.  Speech is clear and coherent with logical content.  Patient is alert and oriented at baseline.  Mild oropharyngeal erythema noted with cobblestoning.  Her right tonsil is slightly larger than the left tonsil.  Cryptic tonsils noted.  No tonsillar exudate appreciated.  Uvula is midline and without lesion or edema.  Assessment and Plan: 1. Gastroesophageal reflux disease without esophagitis (Primary) - pantoprazole (PROTONIX) 40 MG tablet; Take 1 tablet (40 mg total) by mouth daily.  Dispense: 30 tablet; Refill: 0  2. Seasonal allergic rhinitis, unspecified trigger - levocetirizine (XYZAL) 5 MG tablet; Take 1 tablet (5 mg total) by mouth every evening.  Dispense: 30 tablet; Refill: 0  Suspect her hoarseness is a combination of issues including uncontrolled gastroesophageal reflux disease and some likely seasonal allergies.  Supportive measures and OTC medications reviewed.  Will have her start Protonix 40 mg once daily for 2-week trial.  GERD diet reviewed.  Handout given in AVS.  Will also start her on a once nightly levocetirizine 5 mg.  She has to follow-up with [place] in 2 weeks, if unable to get in with a new primary care provider.  Resources sent to help her get established with a new provider.  Also link sent for in person urgent cares in case of any new or worsening symptoms despite treatment.  Follow Up Instructions: I discussed the assessment and treatment plan with the patient. The patient was provided an opportunity to ask questions and all were answered. The patient agreed with the plan and demonstrated an understanding of the instructions.  A copy of instructions were  sent to the patient via MyChart unless otherwise noted below.   The patient was advised to call back or seek an in-person evaluation if the symptoms worsen or if the condition fails to improve as anticipated.    Pamela Climes, PA-C

## 2023-10-22 NOTE — Patient Instructions (Signed)
 Pamela Curtis, thank you for joining [name], PA-C for today's virtual visit.  While this provider is not your primary care provider (PCP), if your PCP is located in our provider database this encounter information will be shared with them immediately following your visit.   A [place] MyChart account gives you access to today's visit and all your visits, tests, and labs performed at [facility] " click here if you don't have a [place] MyChart account or go to [email or web address]  Consent: (Patient) Pamela Curtis provided verbal consent for this virtual visit at the beginning of the encounter.  Current Medications:  Current Outpatient Medications:    cetirizine (ZYRTEC ALLERGY) 10 MG tablet, Take 1 tablet (10 mg total) by mouth at bedtime., Disp: 90 tablet, Rfl: 1   fluticasone (FLONASE) 50 MCG/ACT nasal spray, Place 1 spray into both nostrils daily. Begin by using 2 sprays in each nare daily for 3 to 5 days, then decrease to 1 spray in each nare daily., Disp: 15.8 mL, Rfl: 2   Medications ordered in this encounter:  No orders of the defined types were placed in this encounter.    *If you need refills on other medications prior to your next appointment, please contact your pharmacy*  Follow-Up: Call back or seek an in-person evaluation if the symptoms worsen or if the condition fails to improve as anticipated.   Virtual Care [phone]  Other Instructions Please continue to stay well-hydrated. Follow dietary recommendations below. Start the Protonix on the Xyzal once daily over the next 2 weeks. Consider a saline nasal rinse before bed. Follow-up with [place] in 2 weeks if you have not been able to establish with a new provider, so we can assess how you are doing and make any necessary changes in treatment. For any new or worsening symptoms despite treatment, please seek an in person evaluation ASAP. Use the link below to help get  established with a new primary care provider.  GERD in Adults: Diet Changes When you have gastroesophageal reflux disease (GERD), you may need to make changes to your diet. Choosing the right foods can help with your symptoms. Think about working with an expert in healthy eating called a dietitian. They can help you make healthy food choices. What are tips for following this plan? Reading food labels Look for foods that are low in saturated fat. Foods that may help with your symptoms include: Foods with less than 5% of daily value (DV) of fat. Foods with 0 grams of trans fat. Cooking [employer] in ways that don't use a lot of fat. These ways include: Baking. Steaming. Grilling. Broiling. To add flavor, try to use herbs that are low in spice and acidity. Avoid frying your food. Meal planning  Eat small meals often rather than eating 3 large meals each day. Eat your meals slowly in a place where you feel relaxed. If told by your health care provider, avoid: Foods that cause symptoms. Keep a food diary to keep track of foods that cause symptoms. Alcohol. Drinking a lot of liquid with meals. General instructions For 2-3 hours after you eat, avoid: Bending over. Exercise. Lying down. Chew sugar-free gum after meals. What foods should I eat? Eat a healthy diet. Try to include: Foods with high amounts of fiber. These include: Fruits and vegetables. Whole grains and beans. Low-fat dairy products. Lean meats, fish, and poultry. Egg whites. Foods that cause symptoms in someone else may not cause  symptoms for you. Work with your provider to find foods that are safe for you. The items listed above may not be all the foods and drinks you can have. Talk with a dietitian to learn more. The items listed above may not be a complete list of foods and beverages you can eat and drink. Contact a dietitian for more information. What foods should I avoid? Limiting some of these foods may help  with your symptoms. Each person is different. Talk with a dietitian or your provider to help you find the exact foods to avoid. Some of the foods to avoid may include: Fruits Fruits with a lot of acid in them. These may include citrus fruits, such as oranges, grapefruit, pineapple, and lemons. Vegetables Deep-fried vegetables, such as [place] fries. Vegetables, sauces, or toppings made with added fat and vegetables with acid in them. These may include tomatoes and tomato products, chili peppers, onions, garlic, and horseradish. Grains Pastries or quick breads with added fat. Meats and other proteins High-fat meats, such as fatty beef or pork, hot dogs, ribs, ham, sausage, salami, and bacon. Fried meat or protein, such as fried fish and fried chicken. Egg yolks. Fats and oils Butter. Margarine. Shortening. Ghee. Drinks Coffee and other drinks with caffeine in them. Fizzy and sugary drinks, such as soda and energy drinks. Fruit juice made with acidic fruits, such as orange or grapefruit. Tomato juice. Sweets and desserts Chocolate and cocoa. Donuts. Seasonings and condiments Mint, such as peppermint and spearmint. Condiments, herbs, or seasonings that cause symptoms. These may include curry, hot sauce, or vinegar-based salad dressings. The items listed above may not be all the foods and drinks you should avoid. Talk with a dietitian to learn more. Questions to ask your health care provider Changes to your diet and everyday life are often the first steps taken to manage symptoms of GERD. If these changes don't help, talk with your provider about taking medicines. Where to find more information [employer]: aboutgerd.org This information is not intended to replace advice given to you by your health care provider. Make sure you discuss any questions you have with your health care provider. Document Revised: 05/14/2023 Document Reviewed:  11/28/2022 Elsevier Patient Education  2024 [employer]   If you have been instructed to have an in-person evaluation today at a local Urgent Care facility, please use the link below. It will take you to a list of all of our available [facility], including address, phone number and hours of operation. Please do not delay care.  [facility]  If you or a family member do not have a primary care provider, use the link below to schedule a visit and establish care. When you choose a [place] primary care physician or advanced practice provider, you gain a long-term partner in health. Find a Primary Care Provider I recommend [facility].     Learn more about [facility]'s in-office and virtual care options: [place] - Get Care Now
